# Patient Record
Sex: Female | Born: 1950
Health system: Southern US, Community
[De-identification: ages and names within clinical notes are randomized; demographics above are authoritative.]

## PROBLEM LIST (undated history)

## (undated) DIAGNOSIS — J45909 Unspecified asthma, uncomplicated: Secondary | ICD-10-CM

## (undated) DIAGNOSIS — D693 Immune thrombocytopenic purpura: Secondary | ICD-10-CM

## (undated) DIAGNOSIS — D689 Coagulation defect, unspecified: Secondary | ICD-10-CM

## (undated) DIAGNOSIS — T7840XA Allergy, unspecified, initial encounter: Secondary | ICD-10-CM

## (undated) DIAGNOSIS — M109 Gout, unspecified: Secondary | ICD-10-CM

## (undated) DIAGNOSIS — H409 Unspecified glaucoma: Secondary | ICD-10-CM

## (undated) DIAGNOSIS — H269 Unspecified cataract: Secondary | ICD-10-CM

## (undated) DIAGNOSIS — M1990 Unspecified osteoarthritis, unspecified site: Secondary | ICD-10-CM

## (undated) HISTORY — DX: Unspecified osteoarthritis, unspecified site: M19.90

## (undated) HISTORY — DX: Unspecified glaucoma: H40.9

## (undated) HISTORY — DX: Unspecified cataract: H26.9

## (undated) HISTORY — DX: Allergy, unspecified, initial encounter: T78.40XA

## (undated) HISTORY — DX: Coagulation defect, unspecified: D68.9

## (undated) HISTORY — PX: EYE SURGERY: SHX253

## (undated) HISTORY — PX: OTHER SURGICAL HISTORY: SHX169

---

## 1995-01-22 HISTORY — PX: OTHER SURGICAL HISTORY: SHX169

## 1997-09-19 ENCOUNTER — Other Ambulatory Visit: Admission: RE | Admit: 1997-09-19 | Discharge: 1997-09-19 | Payer: Self-pay | Admitting: Gynecology

## 1998-10-10 ENCOUNTER — Other Ambulatory Visit: Admission: RE | Admit: 1998-10-10 | Discharge: 1998-10-10 | Payer: Self-pay | Admitting: Gynecology

## 1998-10-12 ENCOUNTER — Other Ambulatory Visit: Admission: RE | Admit: 1998-10-12 | Discharge: 1998-10-12 | Payer: Self-pay | Admitting: Gynecology

## 1999-01-22 HISTORY — PX: ABDOMINAL HYSTERECTOMY: SHX81

## 1999-10-11 ENCOUNTER — Other Ambulatory Visit: Admission: RE | Admit: 1999-10-11 | Discharge: 1999-10-11 | Payer: Self-pay | Admitting: Gynecology

## 1999-12-31 ENCOUNTER — Encounter: Payer: Self-pay | Admitting: Gynecology

## 2000-01-03 ENCOUNTER — Inpatient Hospital Stay (HOSPITAL_COMMUNITY): Admission: RE | Admit: 2000-01-03 | Discharge: 2000-01-04 | Payer: Self-pay | Admitting: Gynecology

## 2000-10-20 ENCOUNTER — Other Ambulatory Visit: Admission: RE | Admit: 2000-10-20 | Discharge: 2000-10-20 | Payer: Self-pay | Admitting: Gynecology

## 2001-02-02 ENCOUNTER — Ambulatory Visit (HOSPITAL_COMMUNITY): Admission: RE | Admit: 2001-02-02 | Discharge: 2001-02-02 | Payer: Self-pay | Admitting: Gastroenterology

## 2001-10-27 ENCOUNTER — Other Ambulatory Visit: Admission: RE | Admit: 2001-10-27 | Discharge: 2001-10-27 | Payer: Self-pay | Admitting: Gynecology

## 2002-11-30 ENCOUNTER — Other Ambulatory Visit: Admission: RE | Admit: 2002-11-30 | Discharge: 2002-11-30 | Payer: Self-pay | Admitting: Gynecology

## 2003-12-13 ENCOUNTER — Other Ambulatory Visit: Admission: RE | Admit: 2003-12-13 | Discharge: 2003-12-13 | Payer: Self-pay | Admitting: Gynecology

## 2005-01-28 ENCOUNTER — Other Ambulatory Visit: Admission: RE | Admit: 2005-01-28 | Discharge: 2005-01-28 | Payer: Self-pay | Admitting: Gynecology

## 2005-10-04 ENCOUNTER — Encounter: Admission: RE | Admit: 2005-10-04 | Discharge: 2005-10-04 | Payer: Self-pay | Admitting: Family Medicine

## 2005-10-04 IMAGING — US US ABDOMEN COMPLETE
1 series · 13 of 25 positions shown · non-contrast
Comparison: None

CLINICAL DATA: Elevated liver function tests. Prior splenectomy due to ITP.

ABDOMEN ULTRASOUND
TECHNIQUE: Complete abdominal ultrasound examination was performed including
evaluation of the liver, gallbladder, bile ducts, pancreas, kidneys, spleen,
IVC, and abdominal aorta.

[Series 1: unknown · 13 of 60 slices shown]
[im 1/60]
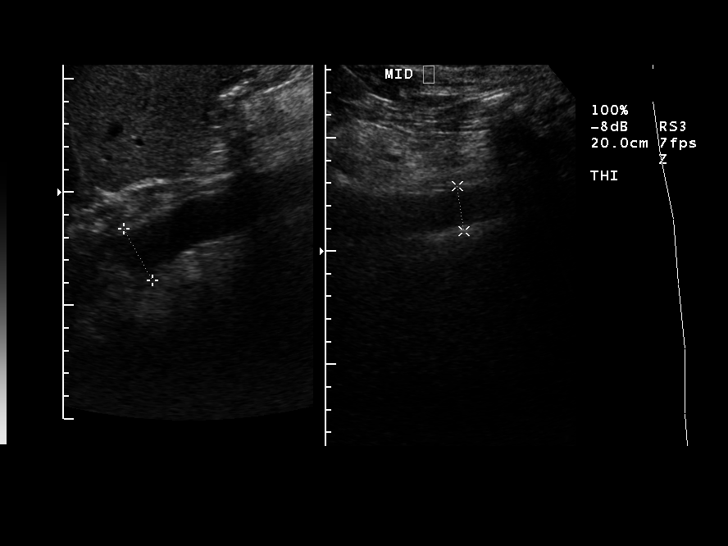
[im 5/60]
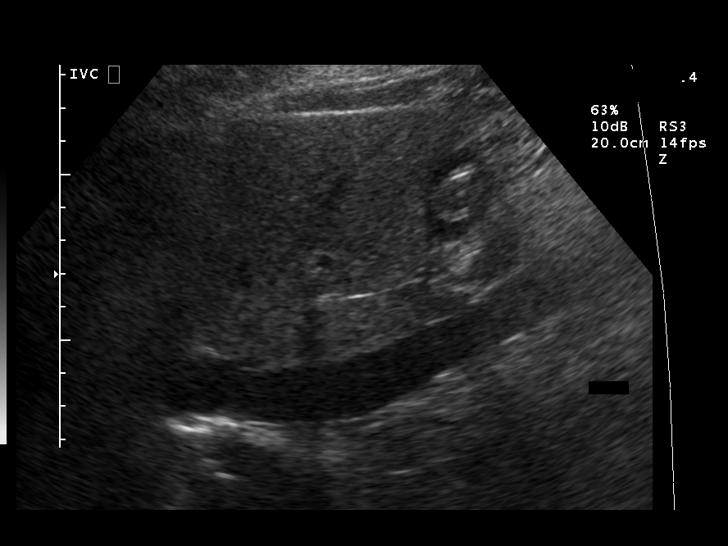
[im 10/60]
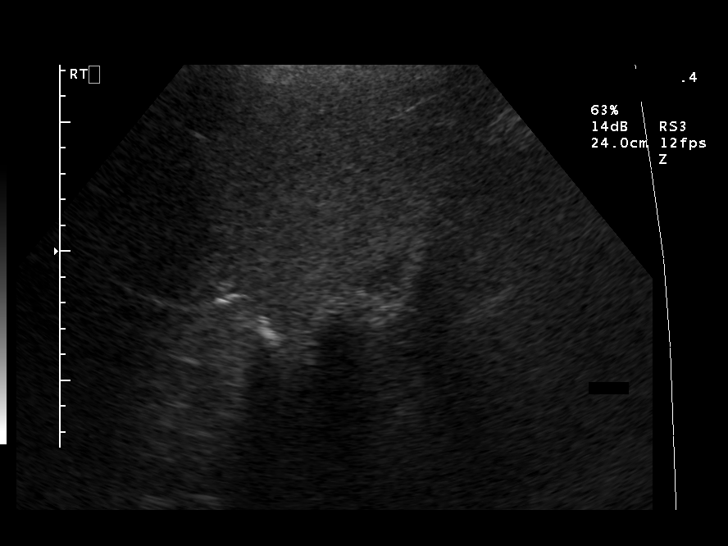
[im 15/60]
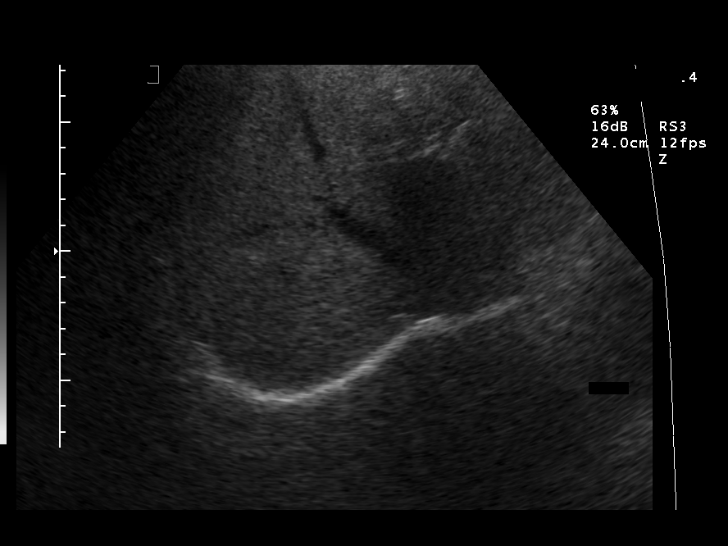
[im 20/60]
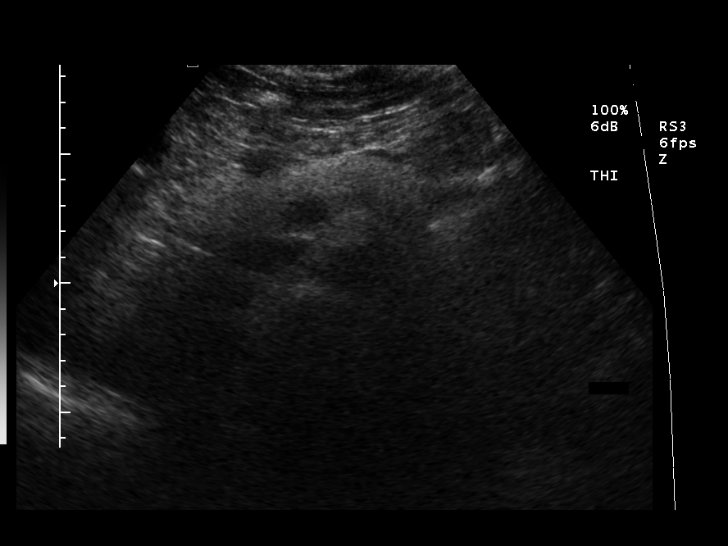
[im 25/60]
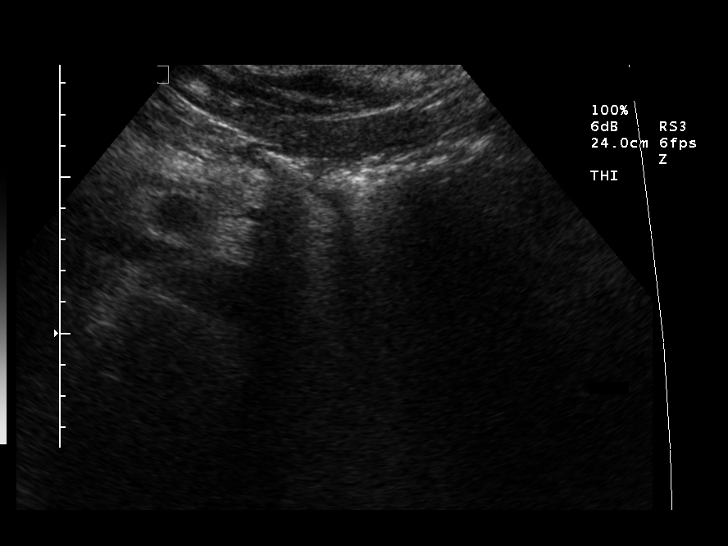
[im 30/60]
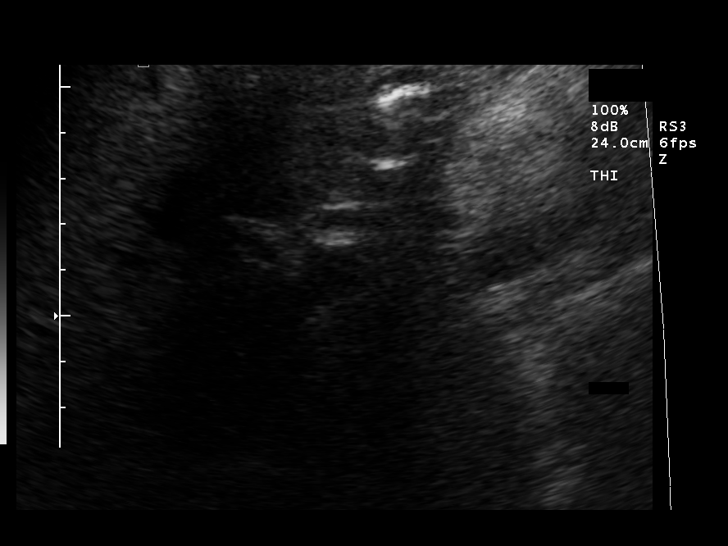
[im 35/60]
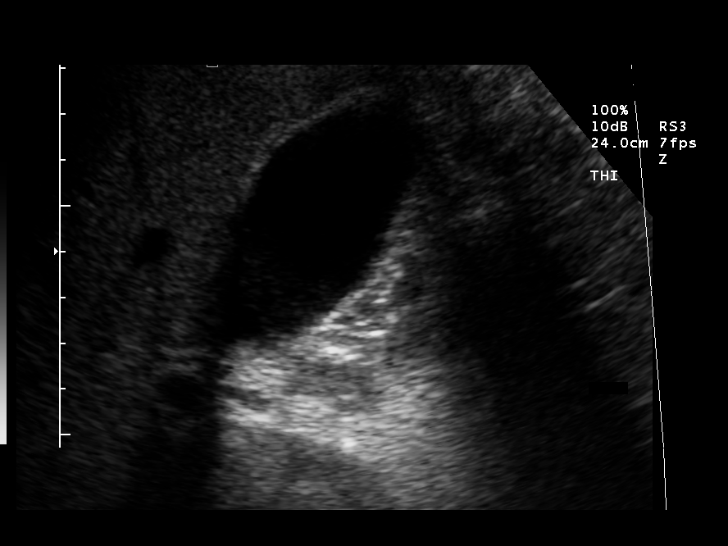
[im 40/60]
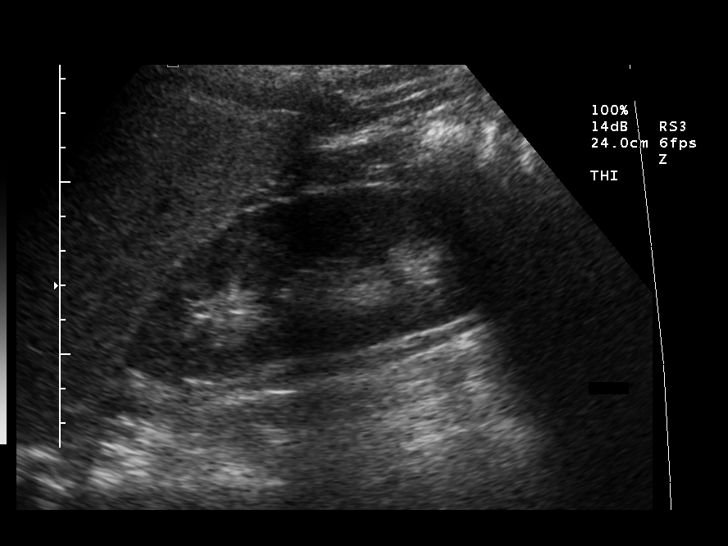
[im 45/60]
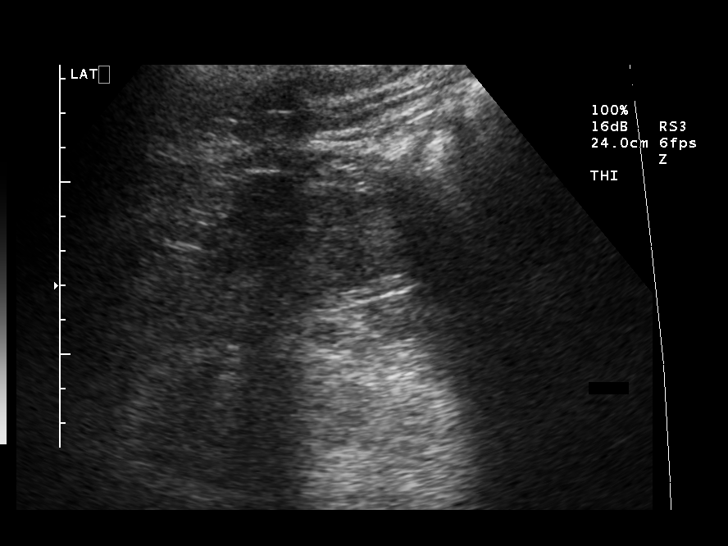
[im 50/60]
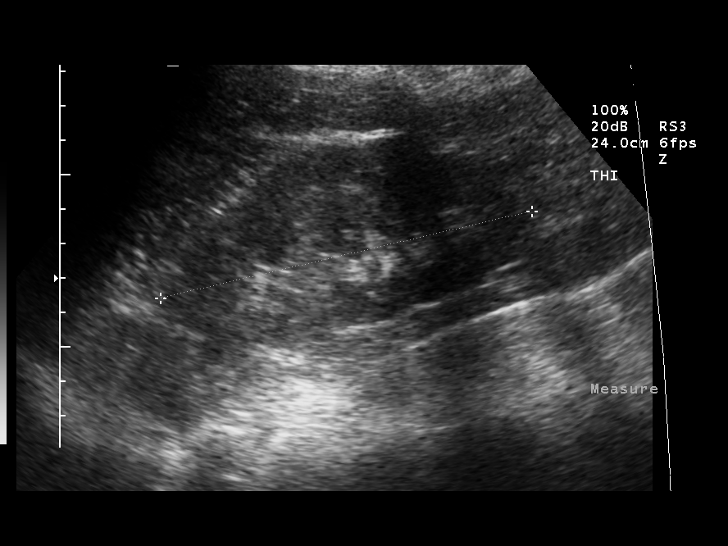
[im 55/60]
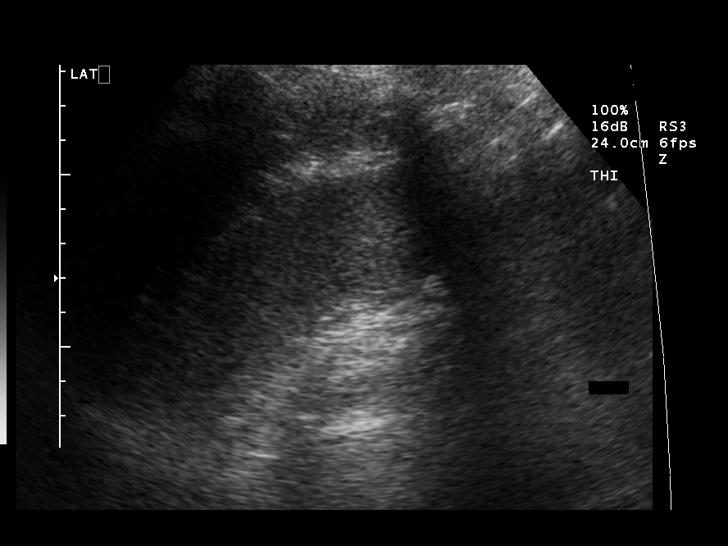
[im 60/60]
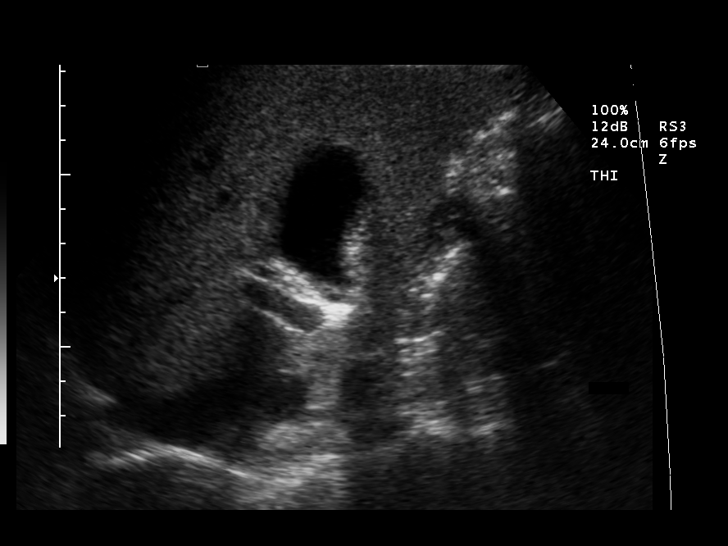

[13 of 25 positions shown; findings below may reference images not displayed]

FINDINGS: The gallbladder appears unremarkable, without evidence of gallbladder wall
thickening, pericholecystic fluid, gallstones, or sonographic Murphy sign.

The common bile duct measures 5 mm which is within normal limits.

The liver is mildly hyperechoic as can be encountered in fatty infiltration. No
focal liver lesion is identified. No hepatic enlargement is detected.

The pancreatic body appears unremarkable. Evaluation of the pancreatic tail,
pancreatic head, and IVC is limited due to overlying bowel gas. The spleen is
absent.

The right kidney measures 12.1 cm in greatest length in the left kidney measures
11.2 cm in greatest length. No renal stones, masses, or hydronephrosis.

The abdominal aorta demonstrates normal tapering course.

IMPRESSION

1. Mild hyperechogenicity of the liver raises the possibility of fatty
infiltration. No focal hepatic abnormality is identified.
2. Limited evaluation of the IVC and pancreatic head and tail due to overlying
bowel gas.
3. Prior splenectomy.
4. Otherwise unremarkable.

## 2012-10-19 ENCOUNTER — Emergency Department (HOSPITAL_COMMUNITY): Payer: BC Managed Care – PPO

## 2012-10-19 ENCOUNTER — Ambulatory Visit: Payer: BC Managed Care – PPO

## 2012-10-19 ENCOUNTER — Emergency Department (HOSPITAL_COMMUNITY)
Admission: EM | Admit: 2012-10-19 | Discharge: 2012-10-20 | Disposition: A | Discharge status: Home / Self Care | Payer: BC Managed Care – PPO | Attending: Emergency Medicine | Admitting: Emergency Medicine

## 2012-10-19 ENCOUNTER — Encounter (HOSPITAL_COMMUNITY): Payer: Self-pay | Admitting: Emergency Medicine

## 2012-10-19 ENCOUNTER — Ambulatory Visit (INDEPENDENT_AMBULATORY_CARE_PROVIDER_SITE_OTHER): Payer: BC Managed Care – PPO | Admitting: Family Medicine

## 2012-10-19 VITALS — BP 110/72 | HR 113 | Temp 98.1°F | Resp 18 | Ht 65.5 in | Wt 179.0 lb

## 2012-10-19 DIAGNOSIS — R0781 Pleurodynia: Secondary | ICD-10-CM

## 2012-10-19 DIAGNOSIS — R059 Cough, unspecified: Secondary | ICD-10-CM

## 2012-10-19 DIAGNOSIS — R109 Unspecified abdominal pain: Secondary | ICD-10-CM

## 2012-10-19 DIAGNOSIS — R1011 Right upper quadrant pain: Secondary | ICD-10-CM | POA: Insufficient documentation

## 2012-10-19 DIAGNOSIS — R0902 Hypoxemia: Secondary | ICD-10-CM | POA: Insufficient documentation

## 2012-10-19 DIAGNOSIS — R05 Cough: Secondary | ICD-10-CM

## 2012-10-19 DIAGNOSIS — R110 Nausea: Secondary | ICD-10-CM | POA: Insufficient documentation

## 2012-10-19 DIAGNOSIS — J209 Acute bronchitis, unspecified: Secondary | ICD-10-CM | POA: Insufficient documentation

## 2012-10-19 DIAGNOSIS — J40 Bronchitis, not specified as acute or chronic: Secondary | ICD-10-CM

## 2012-10-19 DIAGNOSIS — F17200 Nicotine dependence, unspecified, uncomplicated: Secondary | ICD-10-CM | POA: Insufficient documentation

## 2012-10-19 DIAGNOSIS — Z862 Personal history of diseases of the blood and blood-forming organs and certain disorders involving the immune mechanism: Secondary | ICD-10-CM | POA: Insufficient documentation

## 2012-10-19 DIAGNOSIS — R079 Chest pain, unspecified: Secondary | ICD-10-CM

## 2012-10-19 DIAGNOSIS — R1013 Epigastric pain: Secondary | ICD-10-CM | POA: Insufficient documentation

## 2012-10-19 DIAGNOSIS — Z8669 Personal history of other diseases of the nervous system and sense organs: Secondary | ICD-10-CM | POA: Insufficient documentation

## 2012-10-19 HISTORY — DX: Immune thrombocytopenic purpura: D69.3

## 2012-10-19 LAB — POCT CBC
Granulocyte percent: 58.2 %G (ref 37–80)
HCT, POC: 47.7 % (ref 37.7–47.9)
Hemoglobin: 15.4 g/dL (ref 12.2–16.2)
Lymph, poc: 5.7 — AB (ref 0.6–3.4)
MCH, POC: 31.6 pg — AB (ref 27–31.2)
MCHC: 32.3 g/dL (ref 31.8–35.4)
MCV: 98 fL — AB (ref 80–97)
MID (cbc): 1.1 — AB (ref 0–0.9)
MPV: 9.3 fL (ref 0–99.8)
POC Granulocyte: 9.4 — AB (ref 2–6.9)
POC LYMPH PERCENT: 35.2 %L (ref 10–50)
POC MID %: 6.6 %M (ref 0–12)
Platelet Count, POC: 576 10*3/uL — AB (ref 142–424)
RBC: 4.87 M/uL (ref 4.04–5.48)
RDW, POC: 13.6 %
WBC: 16.2 10*3/uL — AB (ref 4.6–10.2)

## 2012-10-19 LAB — URINE MICROSCOPIC-ADD ON

## 2012-10-19 LAB — COMPREHENSIVE METABOLIC PANEL
ALT: 30 U/L (ref 0–35)
AST: 20 U/L (ref 0–37)
Albumin: 3.7 g/dL (ref 3.5–5.2)
BUN: 19 mg/dL (ref 6–23)
CO2: 25 mEq/L (ref 19–32)
Calcium: 9.6 mg/dL (ref 8.4–10.5)
Creatinine, Ser: 0.8 mg/dL (ref 0.50–1.10)
GFR calc non Af Amer: 77 mL/min — ABNORMAL LOW (ref >90)
Total Protein: 7.4 g/dL (ref 6.0–8.3)

## 2012-10-19 LAB — URINALYSIS, ROUTINE W REFLEX MICROSCOPIC
Hgb urine dipstick: NEGATIVE
Protein, ur: NEGATIVE mg/dL
Urobilinogen, UA: 0.2 mg/dL (ref 0.0–1.0)
pH: 5 (ref 5.0–8.0)

## 2012-10-19 LAB — CBC
MCH: 31.2 pg (ref 26.0–34.0)
MCV: 92.8 fL (ref 78.0–100.0)
Platelets: 559 10*3/uL — ABNORMAL HIGH (ref 150–400)
RDW: 13.5 % (ref 11.5–15.5)

## 2012-10-19 LAB — LIPASE, BLOOD: Lipase: 29 U/L (ref 11–59)

## 2012-10-19 IMAGING — CT CT ABD-PELV W/ CM
1 of 3 series · 14 of 32 positions shown, 19 images · IV contrast (OMNIPAQUE 300)
Comparison: Noncontrast CT earlier today.

CLINICAL DATA: Severe right flank pain, right upper quadrant pain.

EXAM:
CT ABDOMEN AND PELVIS WITH CONTRAST
TECHNIQUE: Multidetector CT imaging of the abdomen and pelvis was performed
using the standard protocol following bolus administration of
intravenous contrast.
CONTRAST:  100mL OMNIPAQUE IOHEXOL 300 MG/ML  SOLN

[Series 2: abd/pel with · axial · 0.83mm/px · z∈[+1028,+1434]mm · 14 of 93 slices shown, 19 images]
[im 6/93  soft-tissue]
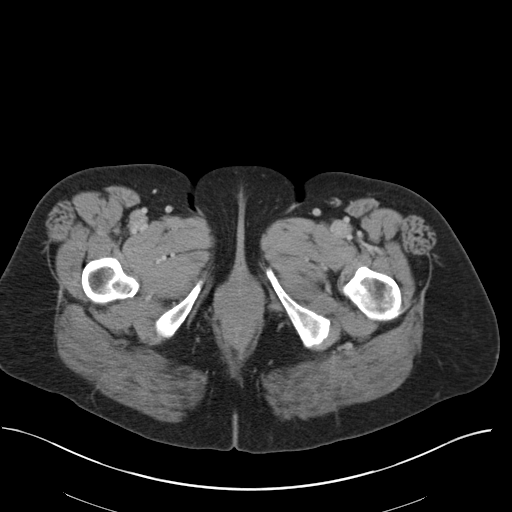
[im 6/93  bone]
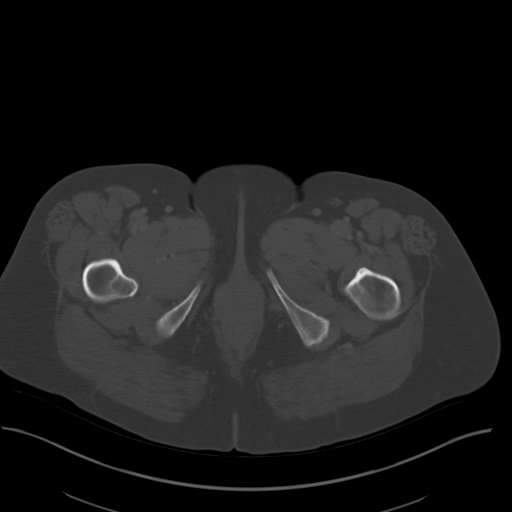
[im 11/93  soft-tissue]
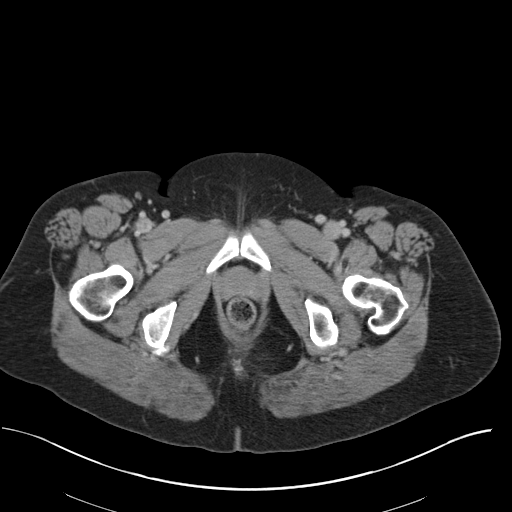
[im 21/93  soft-tissue]
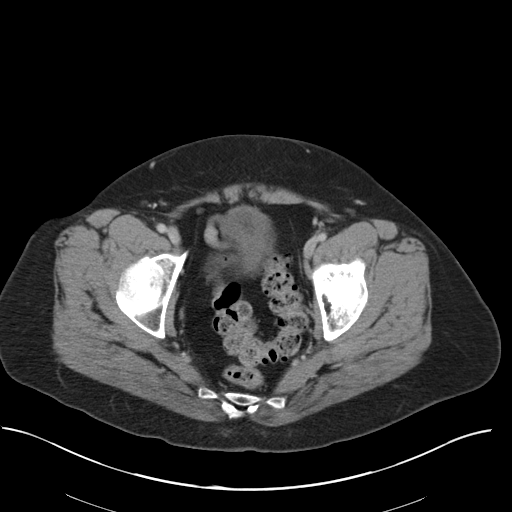
[im 26/93  soft-tissue]
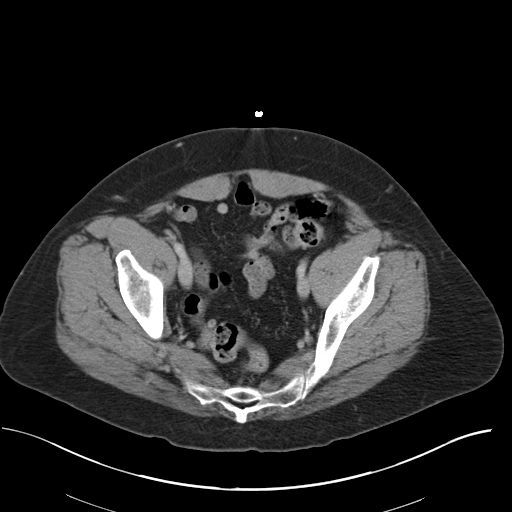
[im 31/93  soft-tissue]
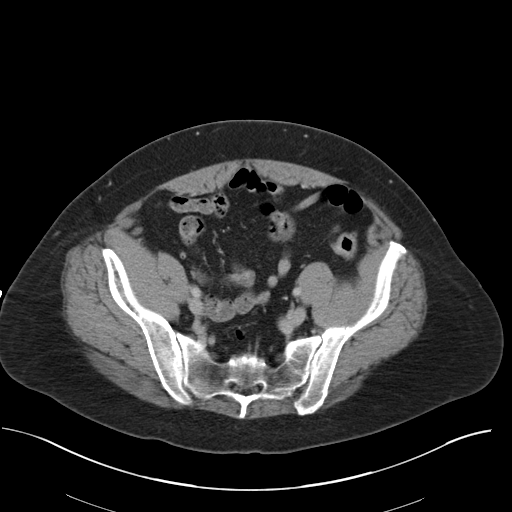
[im 41/93  soft-tissue]
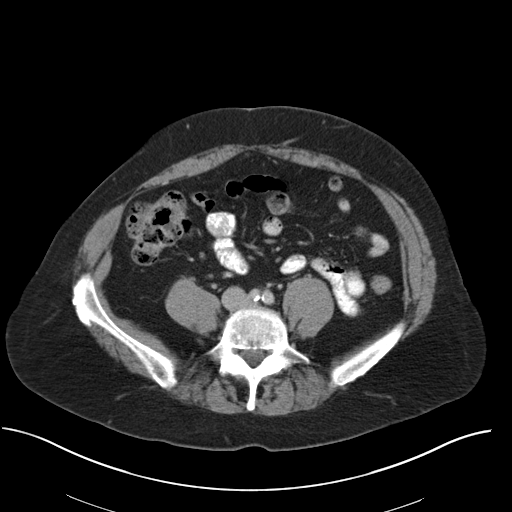
[im 47/93  soft-tissue]
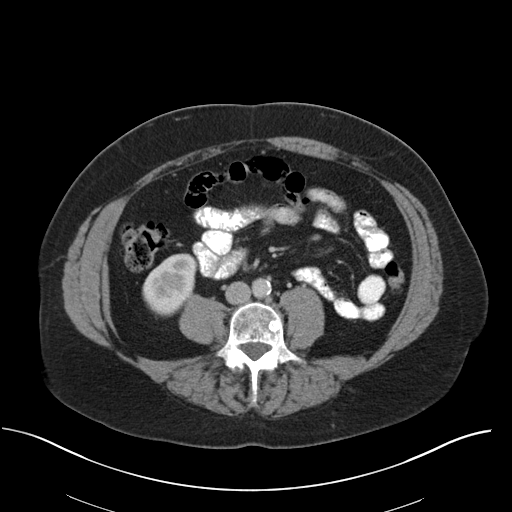
[im 52/93  soft-tissue]
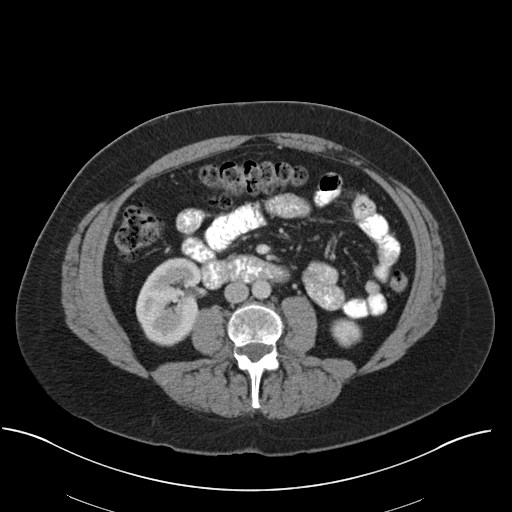
[im 62/93  soft-tissue]
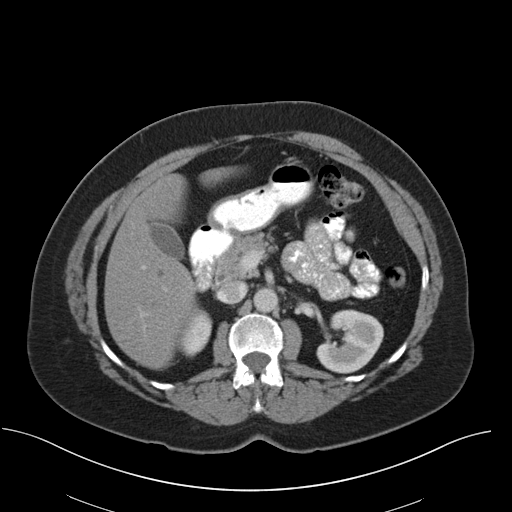
[im 62/93  bone]
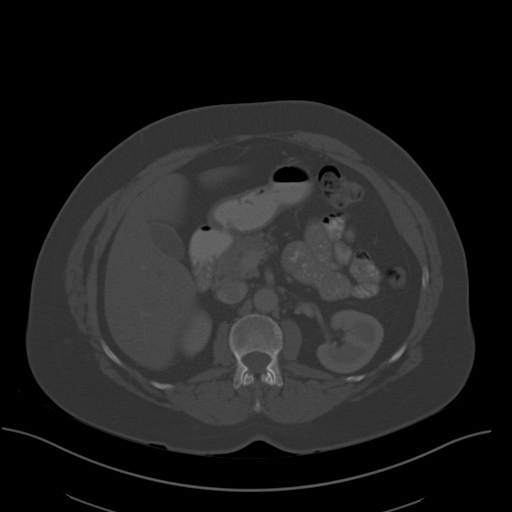
[im 67/93  soft-tissue]
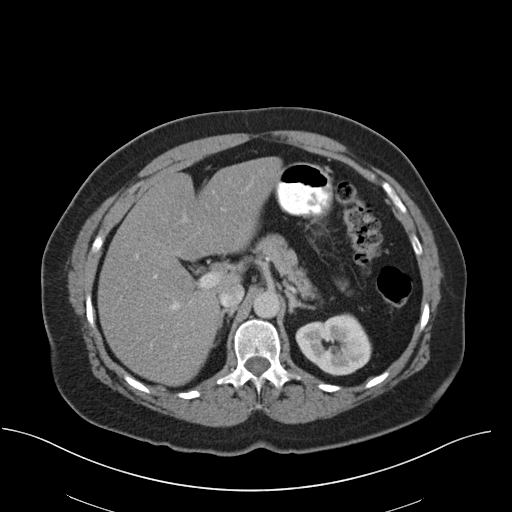
[im 72/93  soft-tissue]
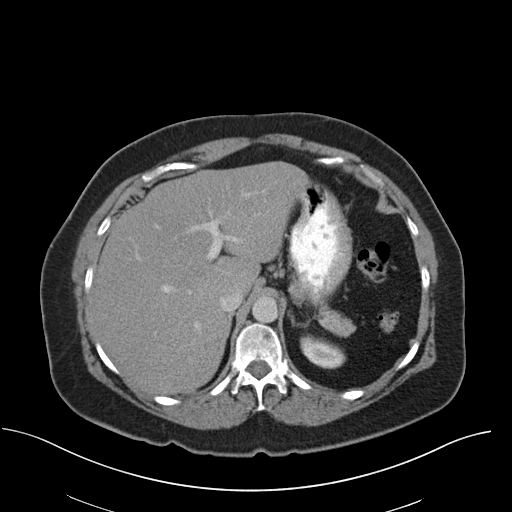
[im 72/93  lung]
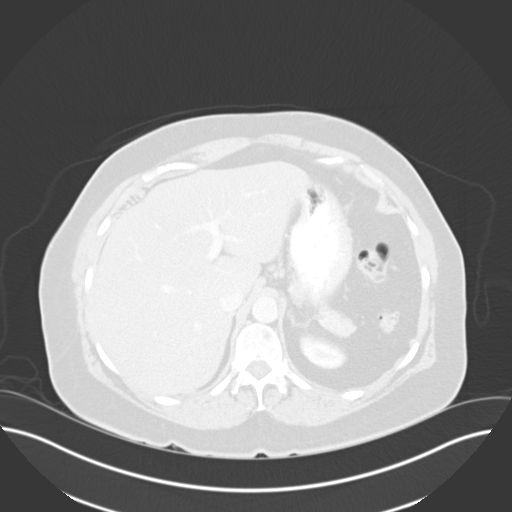
[im 77/93  lung]
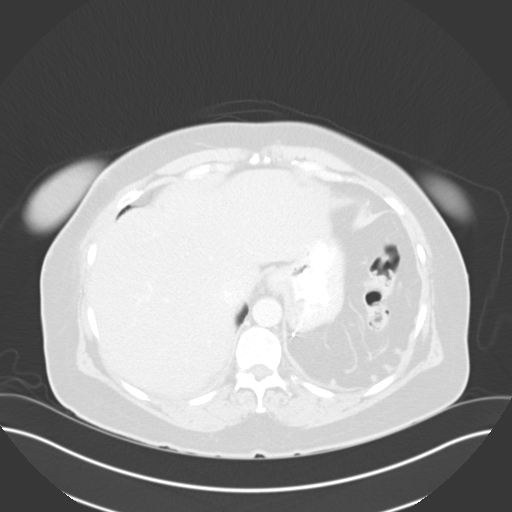
[im 82/93  soft-tissue]
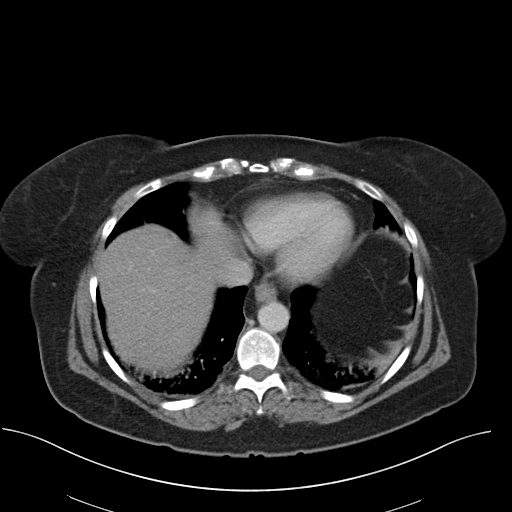
[im 82/93  lung]
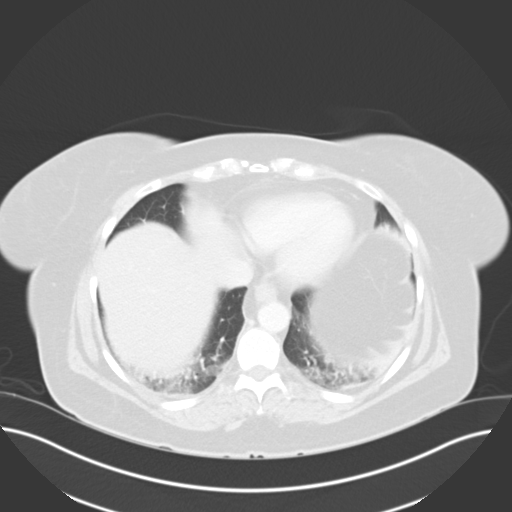
[im 87/93  soft-tissue]
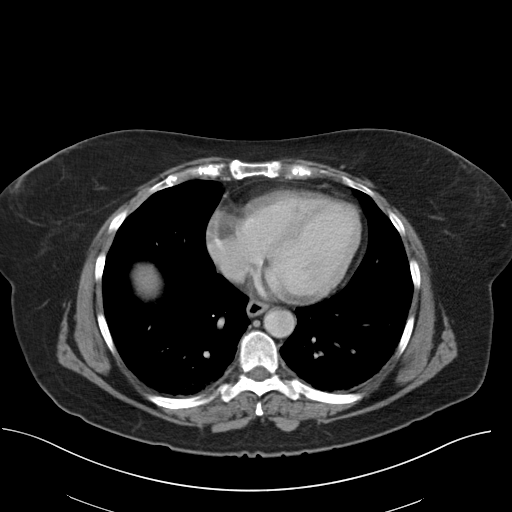
[im 87/93  lung]
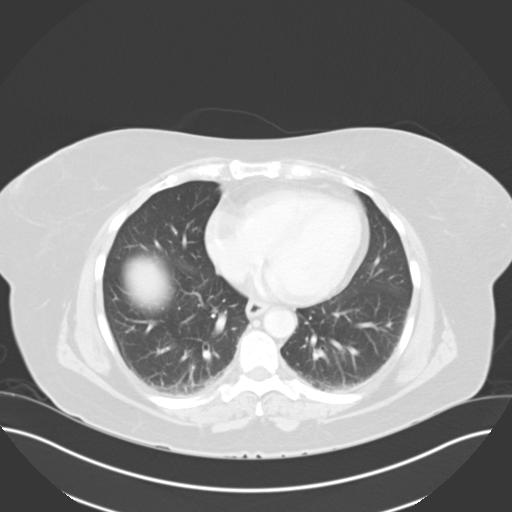

[14 of 32 positions shown; findings below may reference images not displayed]

FINDINGS: Dependent atelectasis in the lung bases. No effusions.

Suspect mild fatty infiltration of the liver. Tiny hypodensity
peripherally in the right inferior liver, likely small cyst. Prior
splenectomy. Gallbladder, pancreas, adrenals and kidneys are
unremarkable. Appendix is visualized and is normal.

Scattered sigmoid and descending colonic diverticula. No active
diverticulitis. Small bowel is decompressed. No free fluid, free air
or adenopathy. Prior hysterectomy. No adnexal masses. Urinary
bladder is unremarkable.

Aorta and iliac vessels are normal caliber. No acute bony
abnormality.
IMPRESSION: Diffuse fatty infiltration of the liver.

No acute findings in the abdomen or pelvis.

## 2012-10-19 IMAGING — CR DG CHEST 2V
2 series · 2 of 2 positions shown · non-contrast
Comparison: None.

CLINICAL DATA: Cough.

EXAM:
CHEST  2 VIEW

[w chest pa]
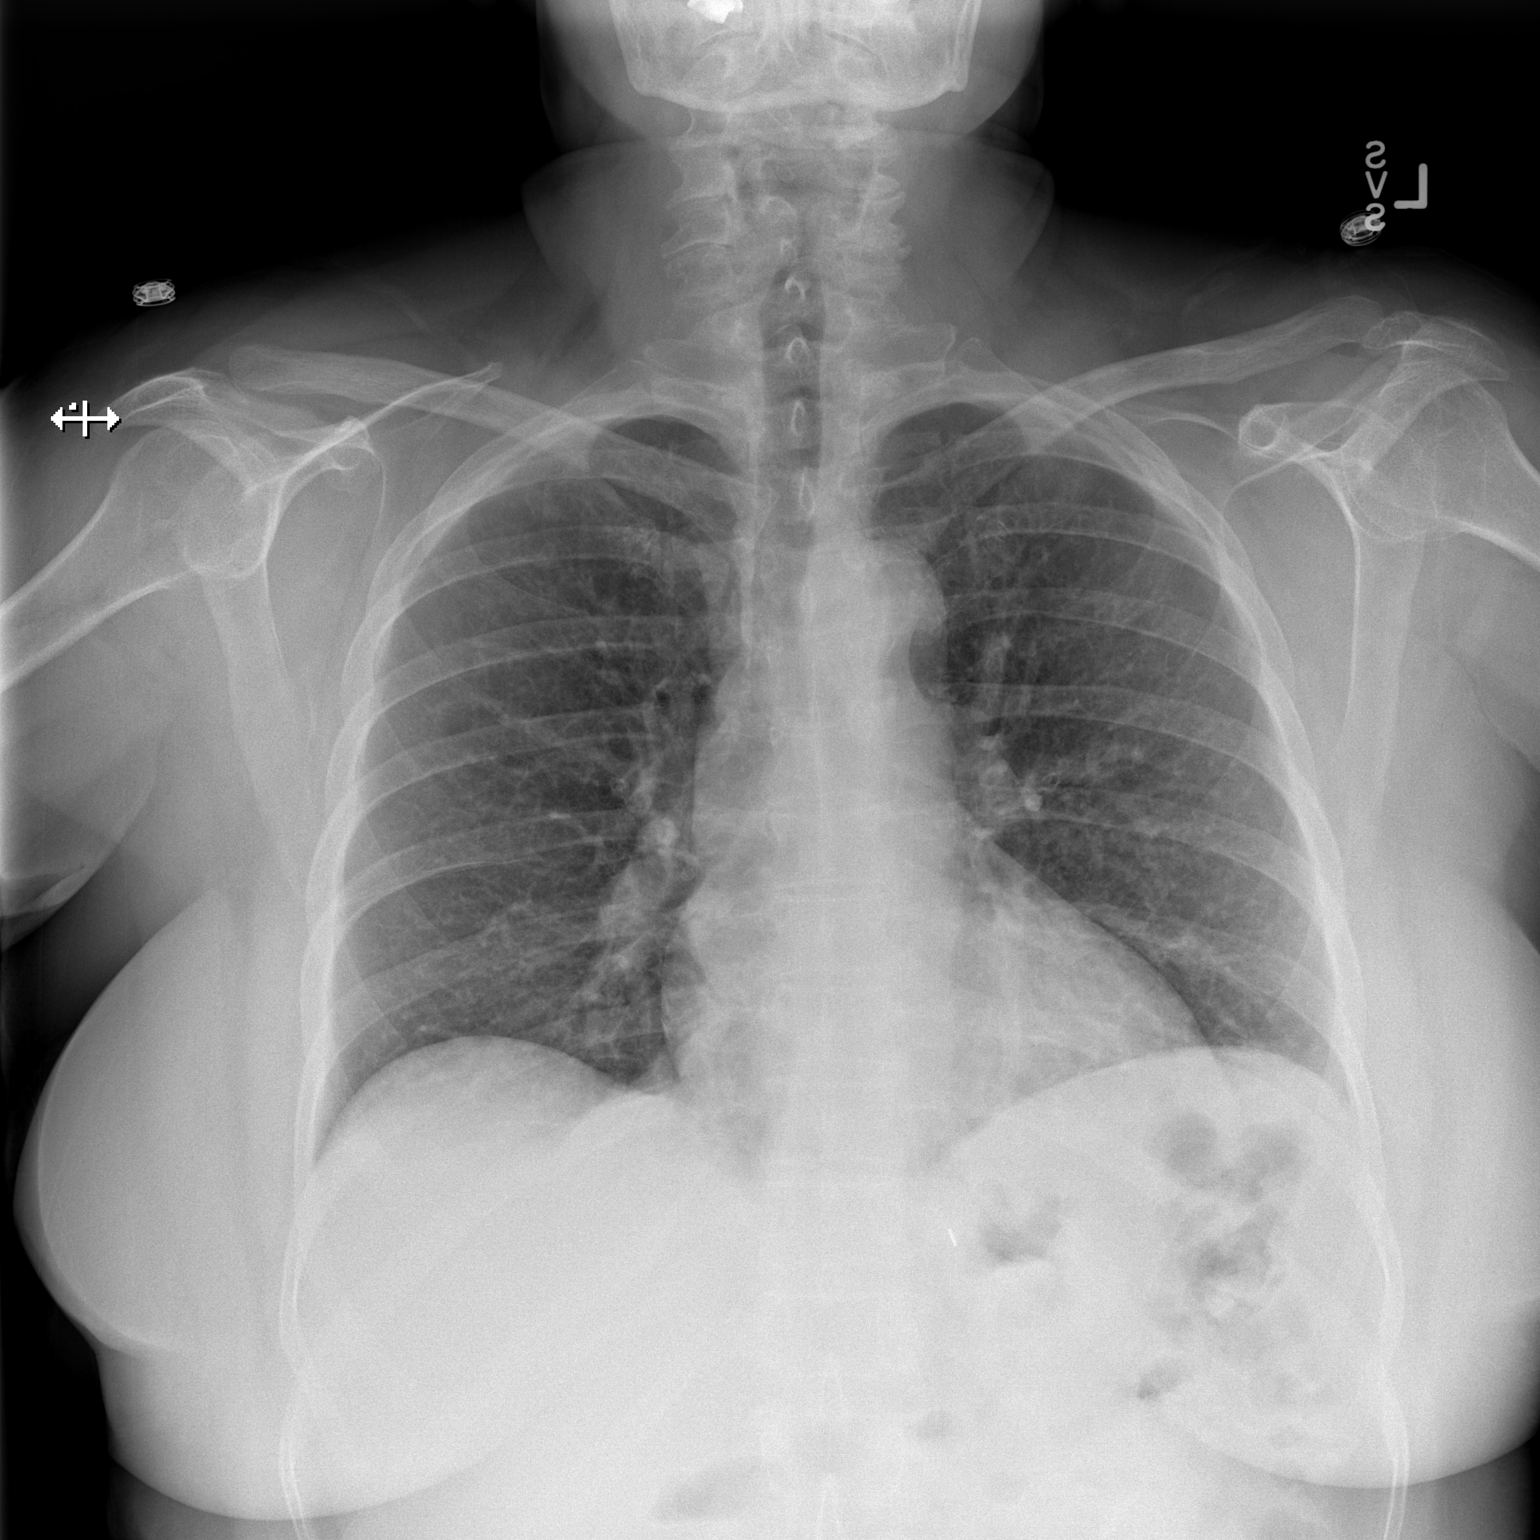

[w chest lat]
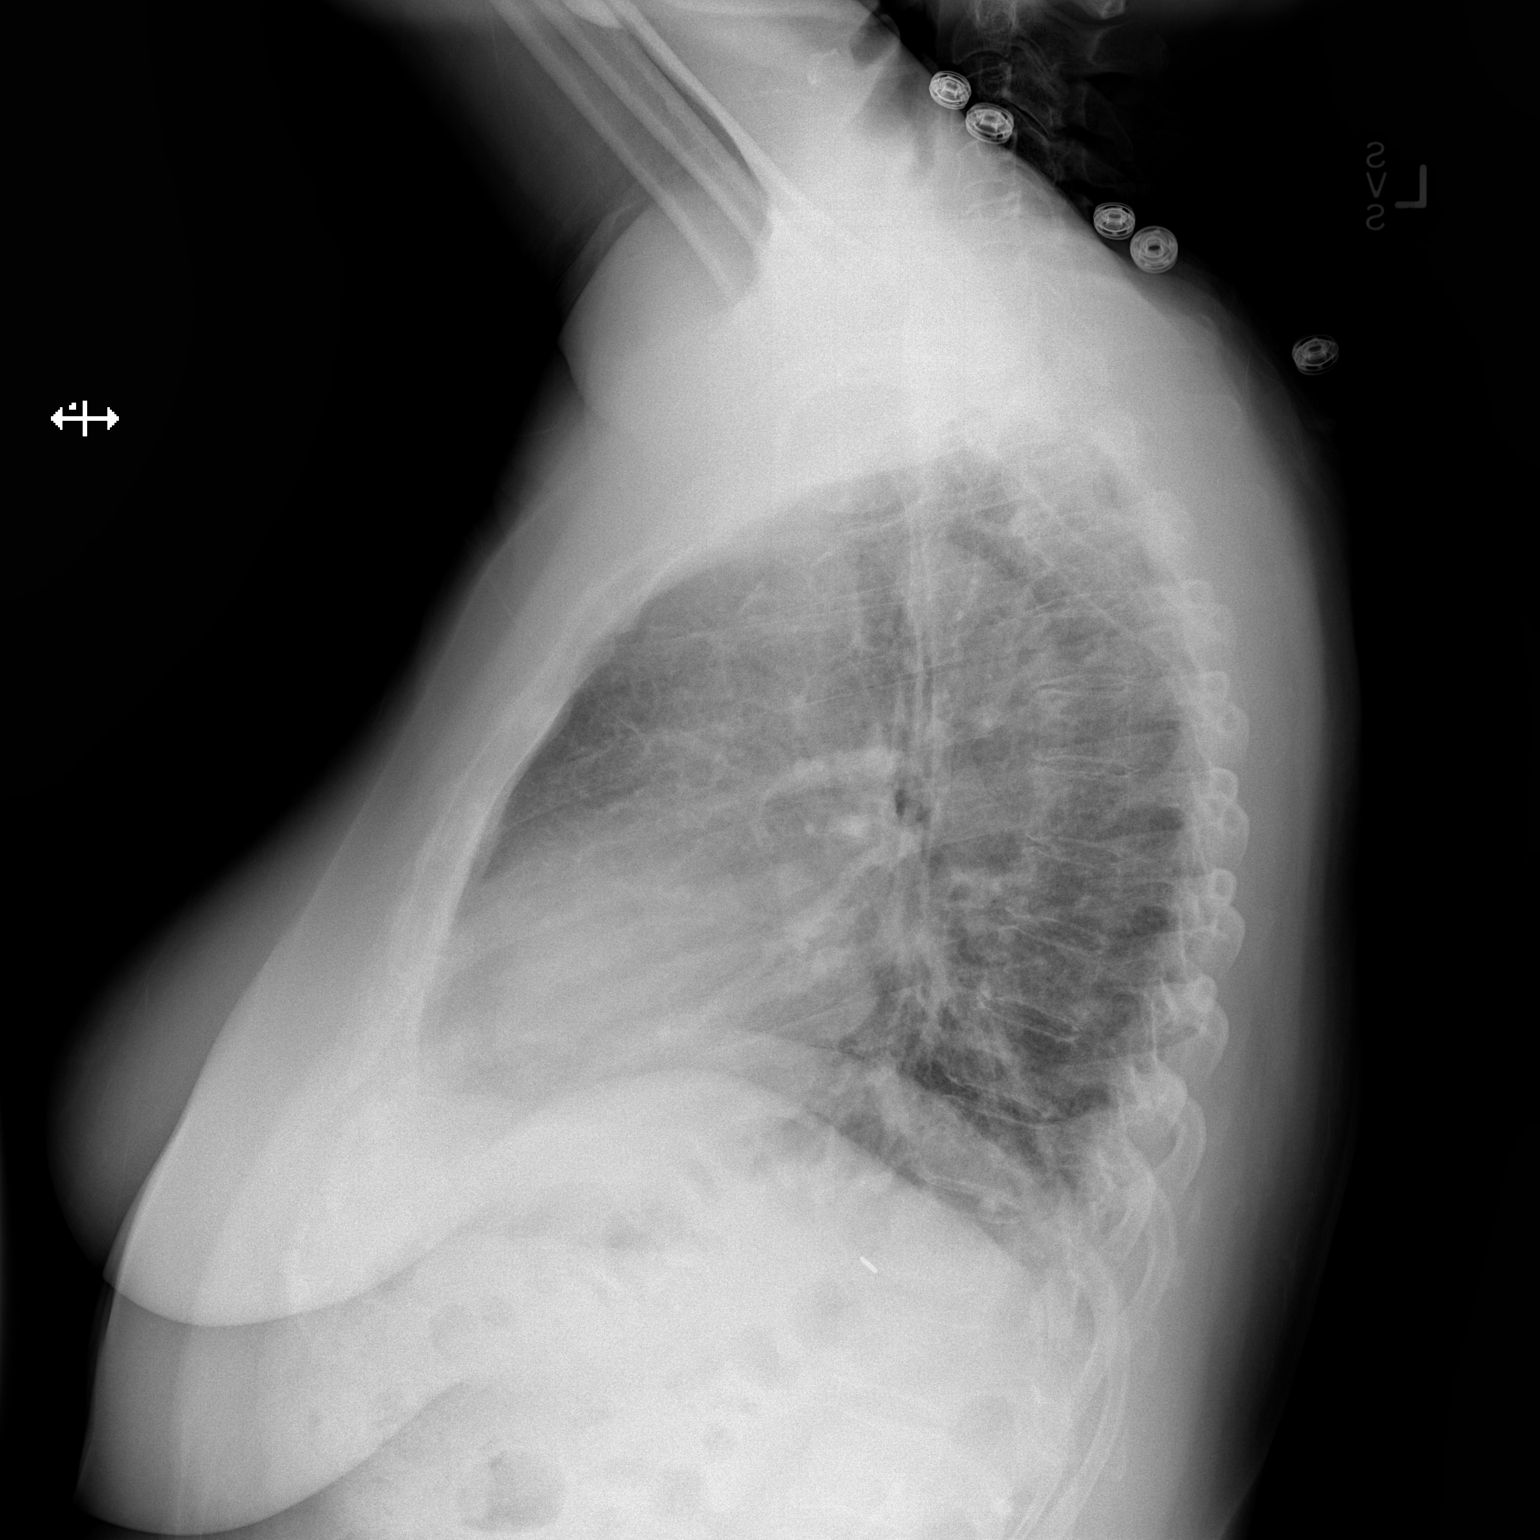

[2 of 2 positions shown; findings below may reference images not displayed]

FINDINGS: The cardiac silhouette, mediastinal and hilar contours are within
normal limits. Mild bronchitic type lung changes but no infiltrates,
edema or effusions. The bony thorax is intact.
IMPRESSION: Mild bronchitic type lung changes likely related to smoking. No
acute pulmonary findings.

## 2012-10-19 IMAGING — CR DG RIBS W/ CHEST 3+V*R*
4 series · 4 of 4 positions shown · non-contrast
Comparison: None.

CLINICAL DATA: Rib pain.

EXAM:
RIGHT RIBS AND CHEST - 3+ VIEW

[oblique (1 of 2)]
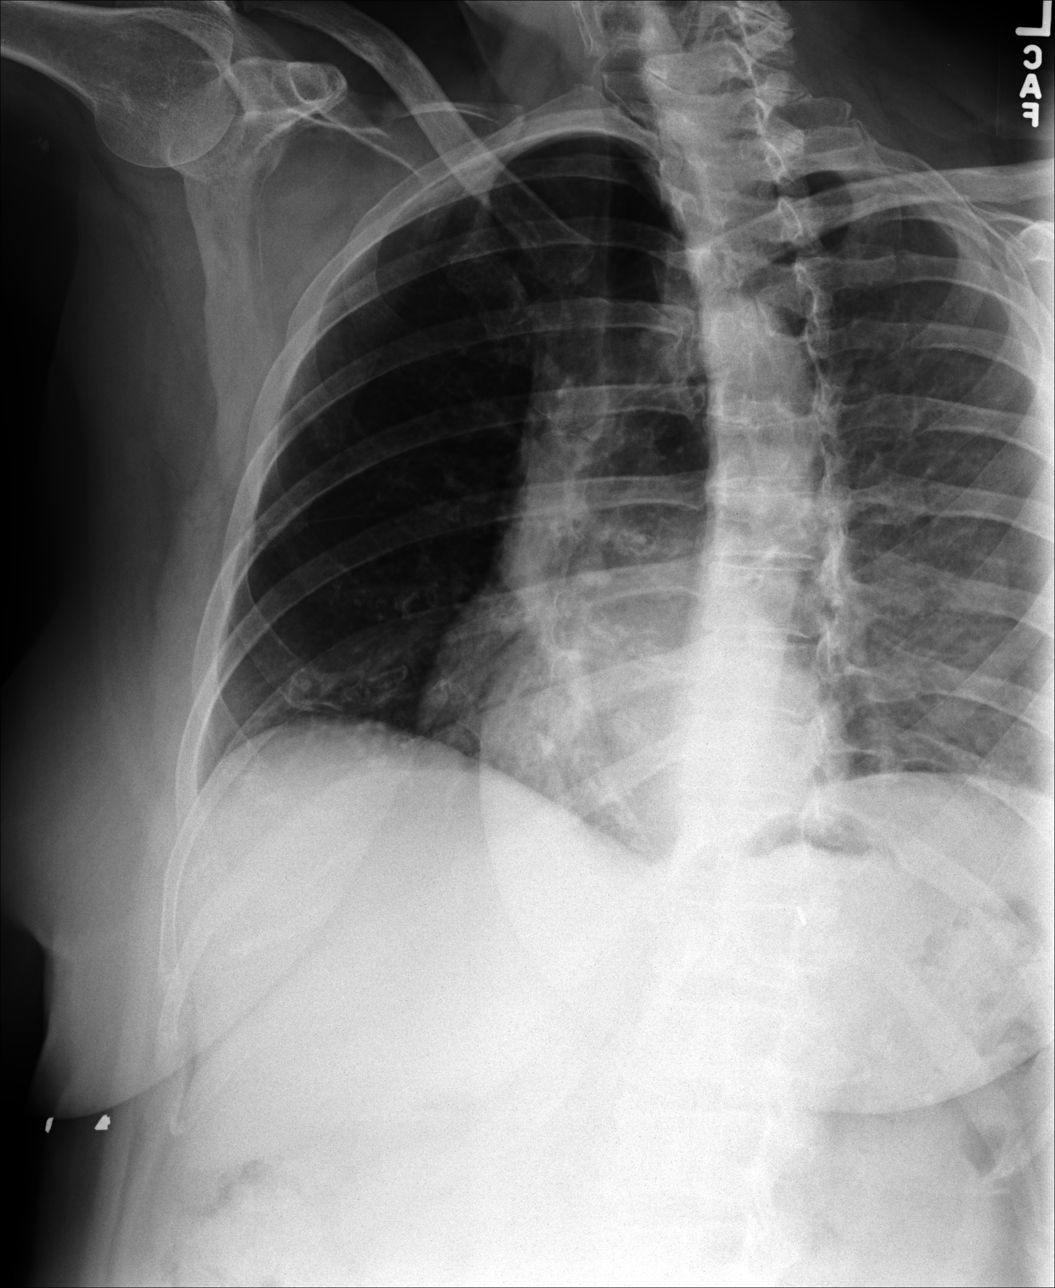

[PA]
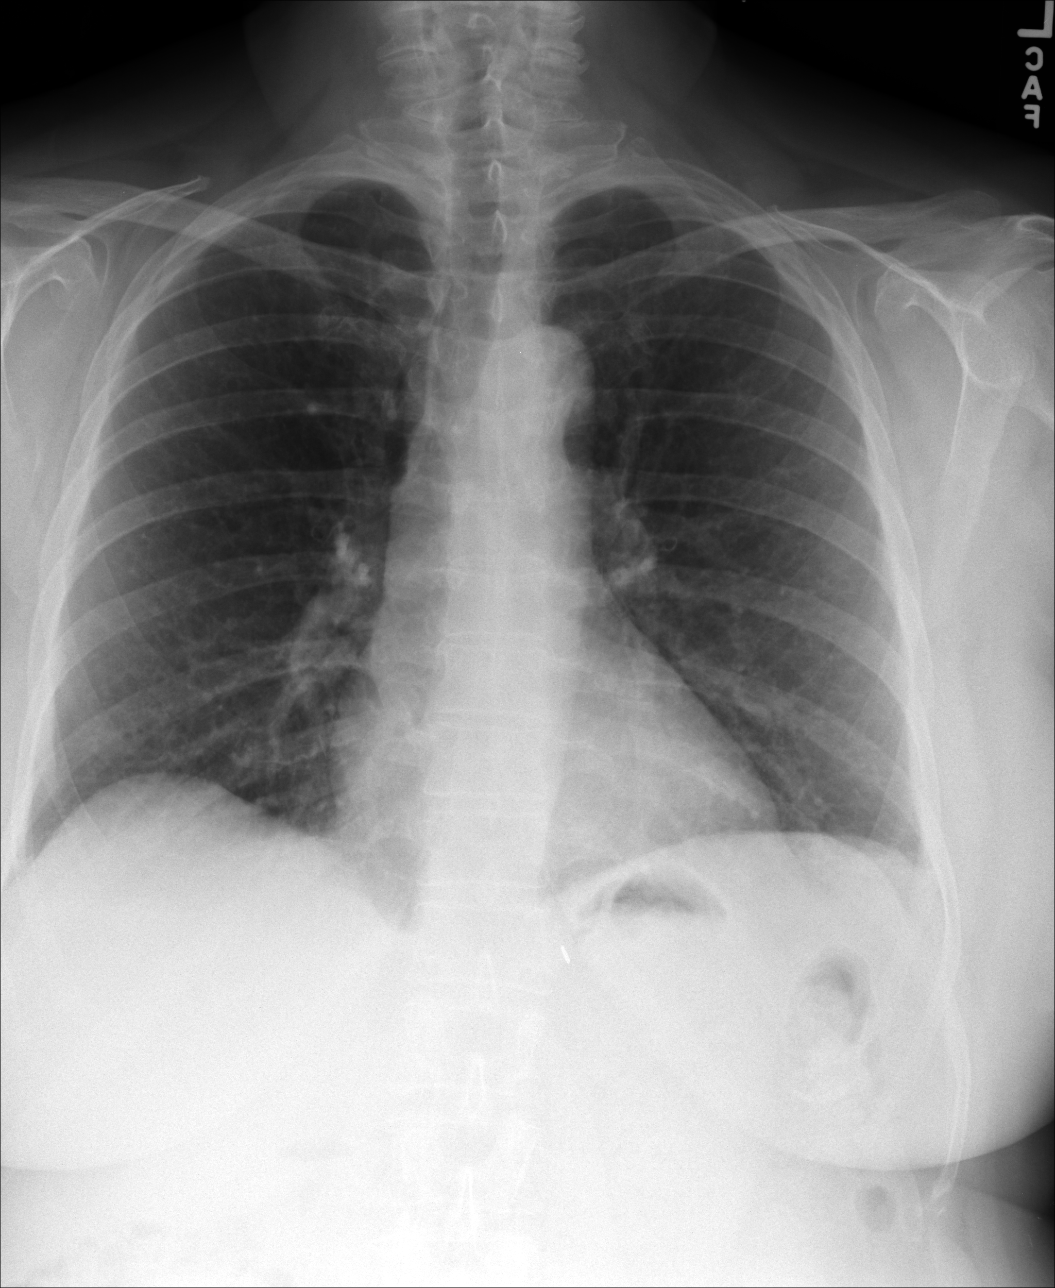

[lateral]
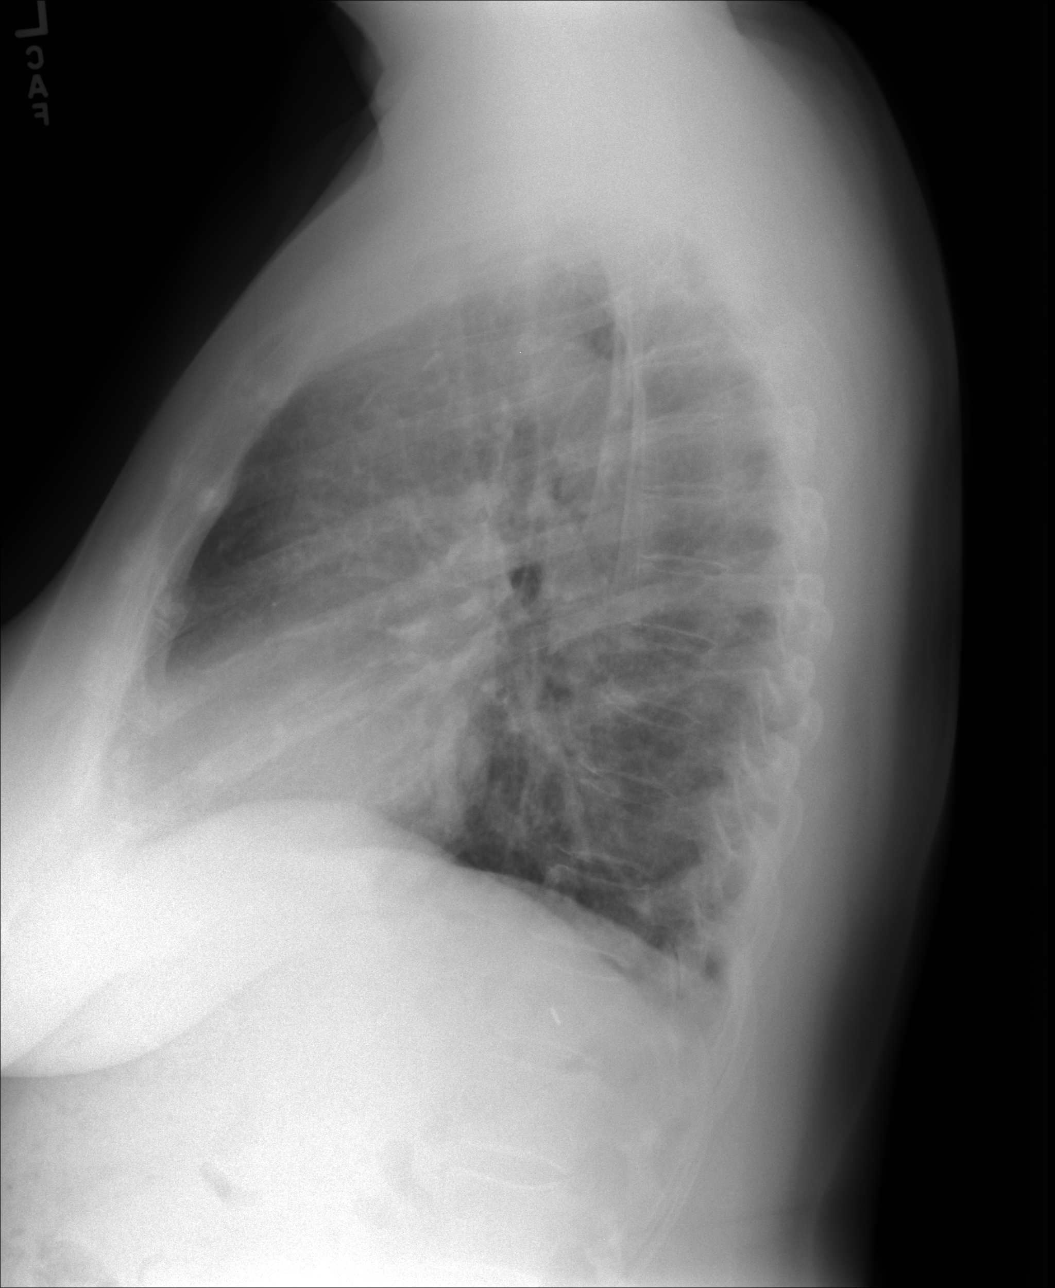

[oblique (2 of 2)]
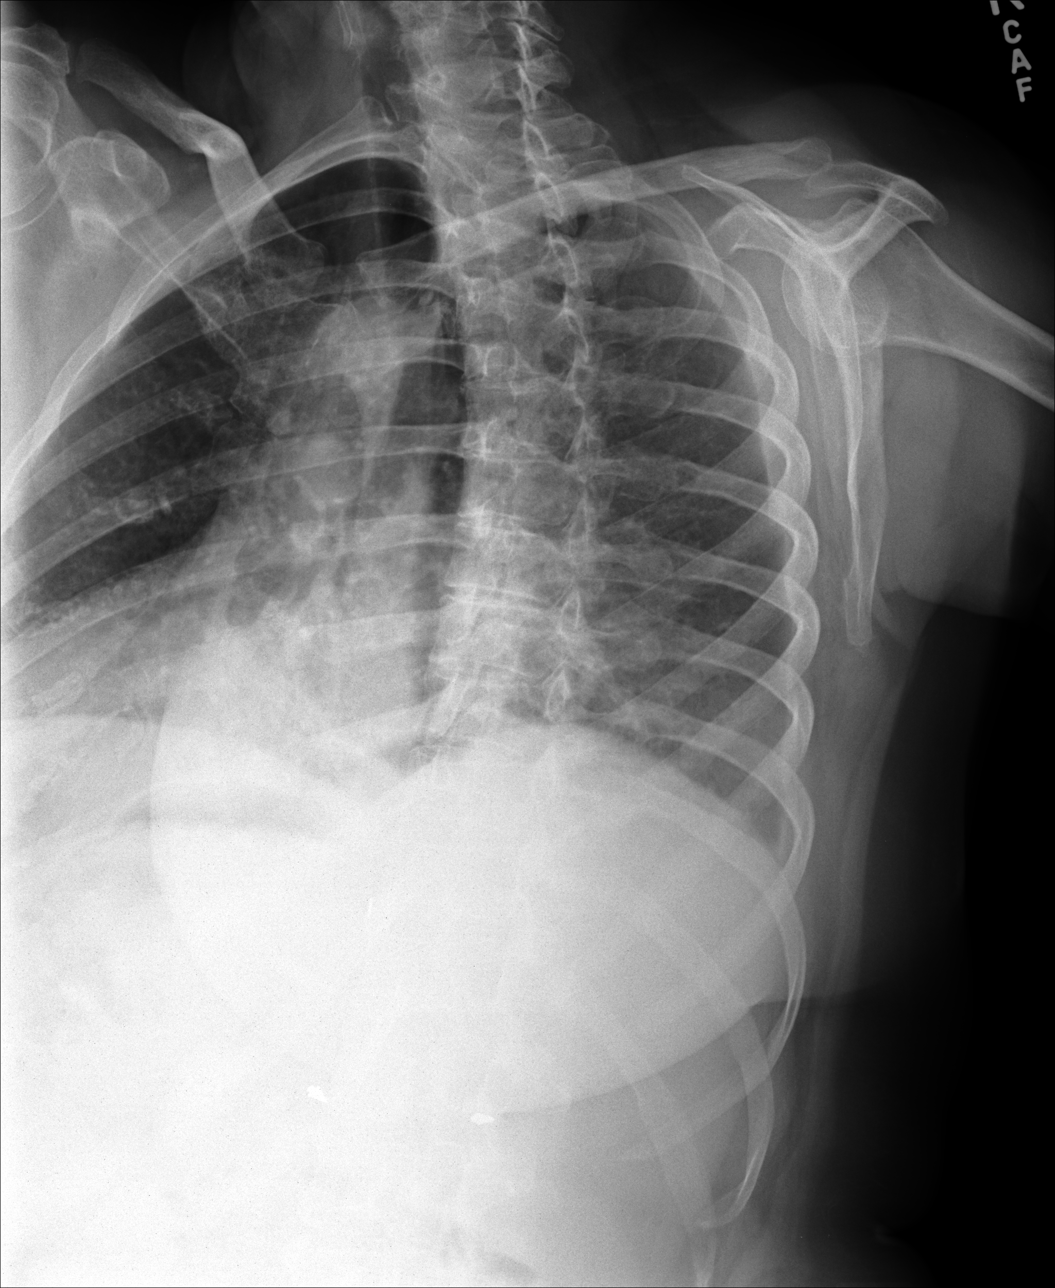

[4 of 4 positions shown; findings below may reference images not displayed]

FINDINGS: No fracture or other bone lesions are seen involving the ribs. There
is no evidence of pneumothorax or pleural effusion. Both lungs are
clear. Heart size and mediastinal contours are within normal limits.
IMPRESSION: Negative.

## 2012-10-19 IMAGING — US US ABDOMEN COMPLETE
1 series · 14 of 25 positions shown · non-contrast
Comparison: Ultrasound dated [DATE]

CLINICAL DATA: Right upper quadrant pain. Nausea.

EXAM:
ULTRASOUND ABDOMEN COMPLETE

[Series 1: us abdomen complete · 0.22mm/px · 14 of 72 slices shown]
[im 1/72]
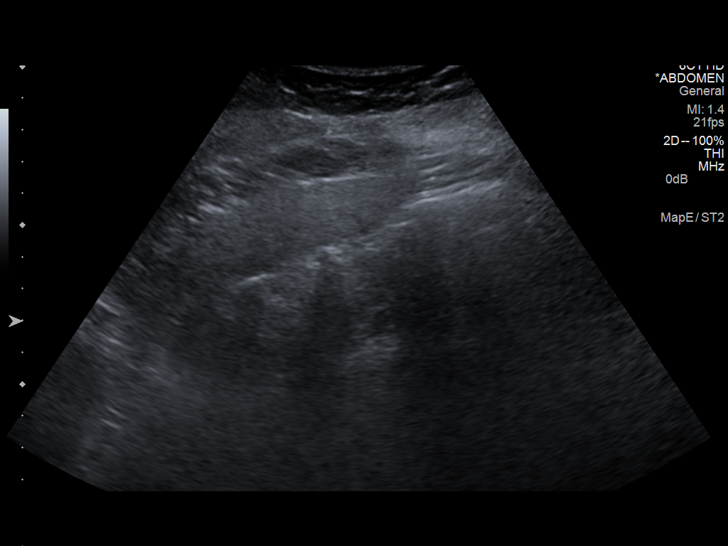
[im 6/72]
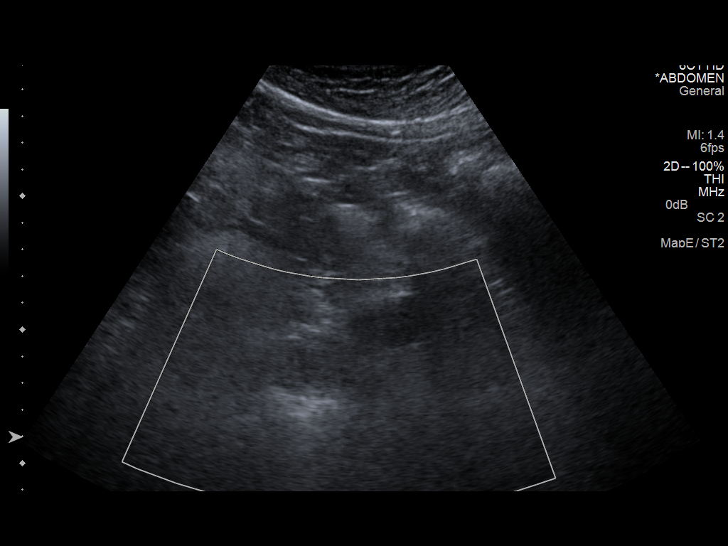
[im 12/72]
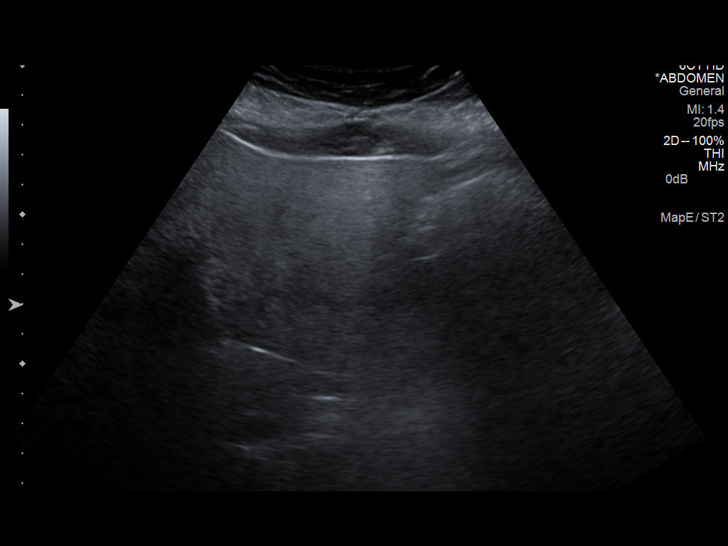
[im 18/72]
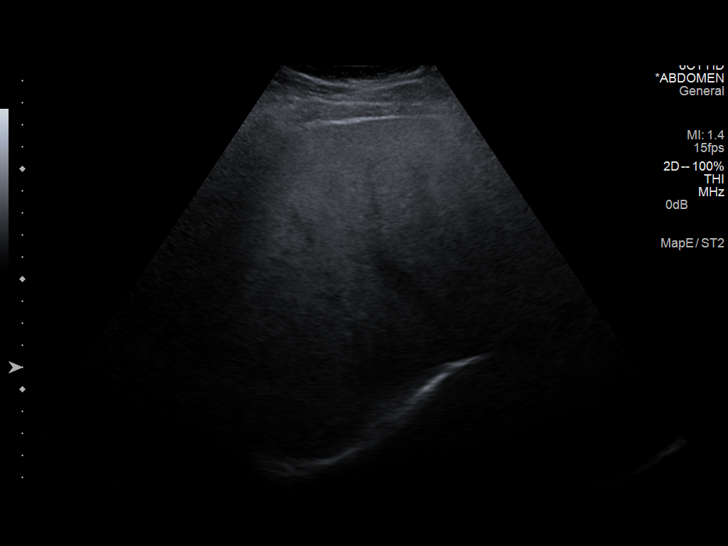
[im 24/72]
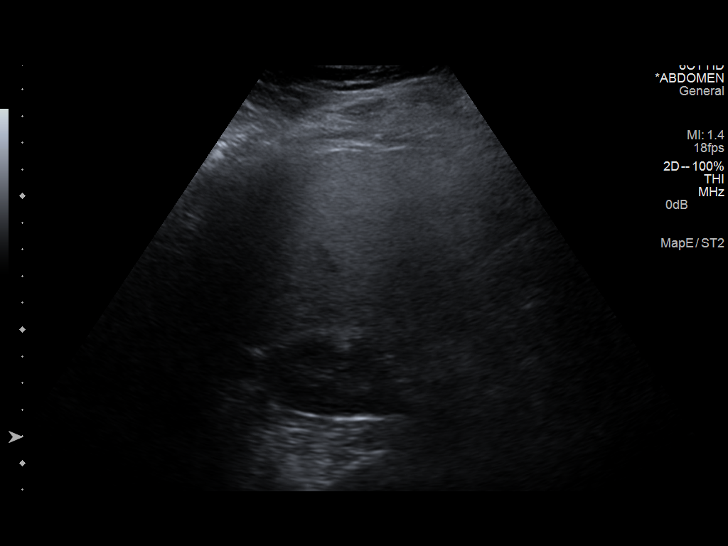
[im 27/72]
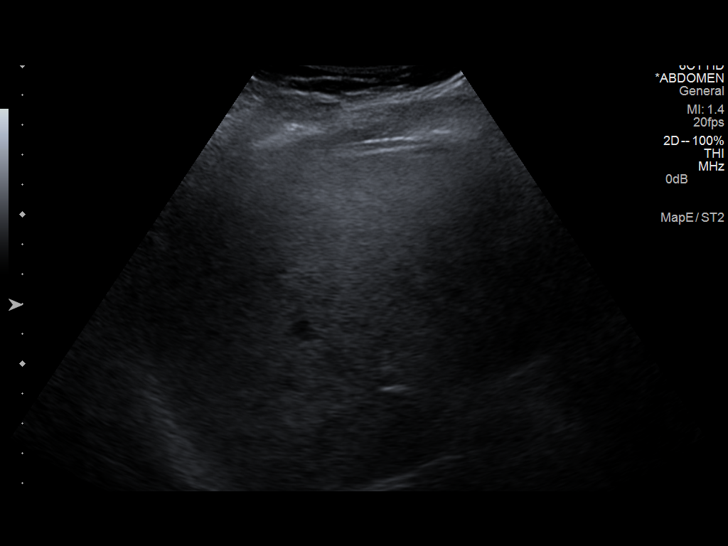
[im 33/72]
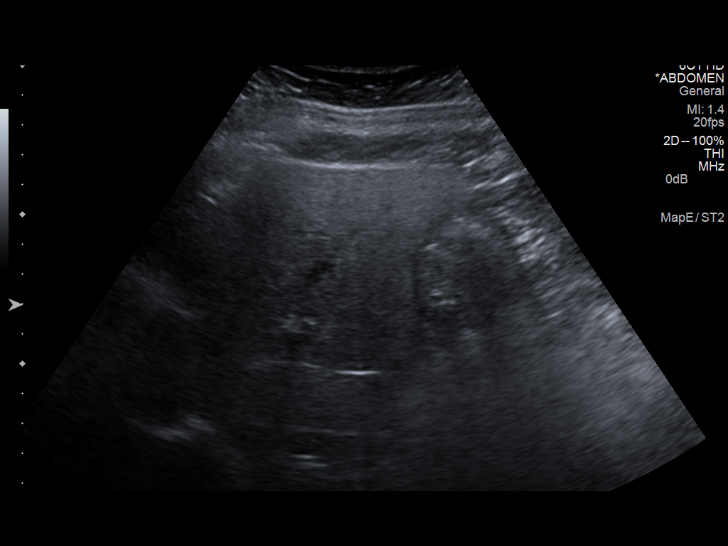
[im 39/72]
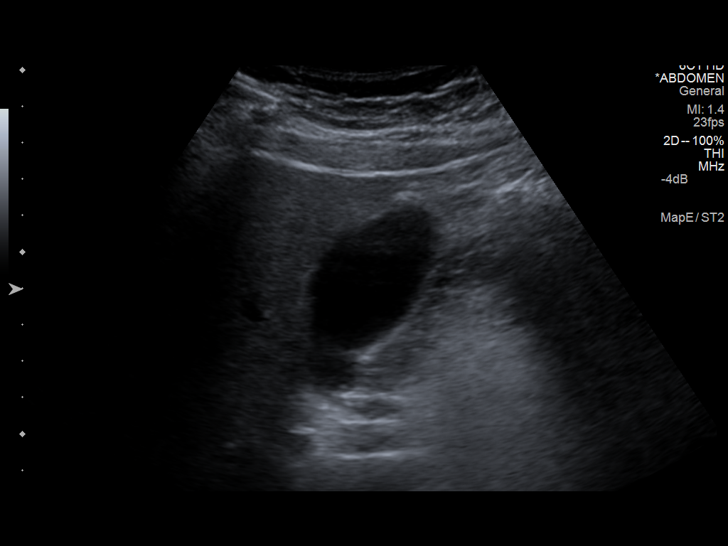
[im 45/72]
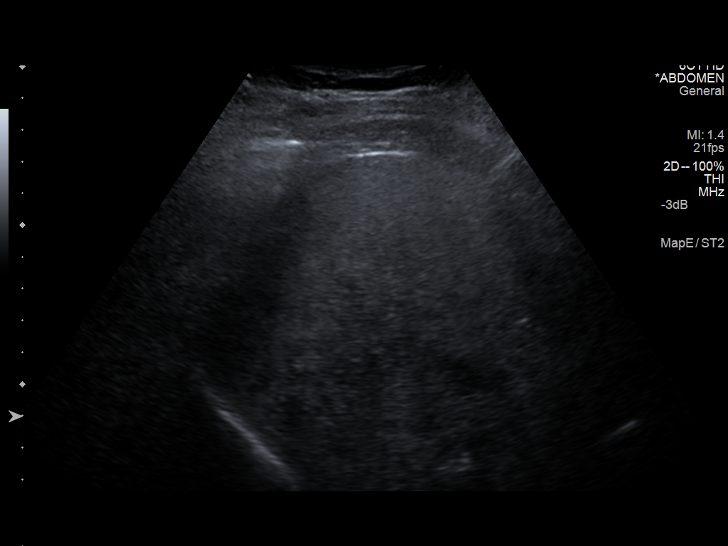
[im 48/72]
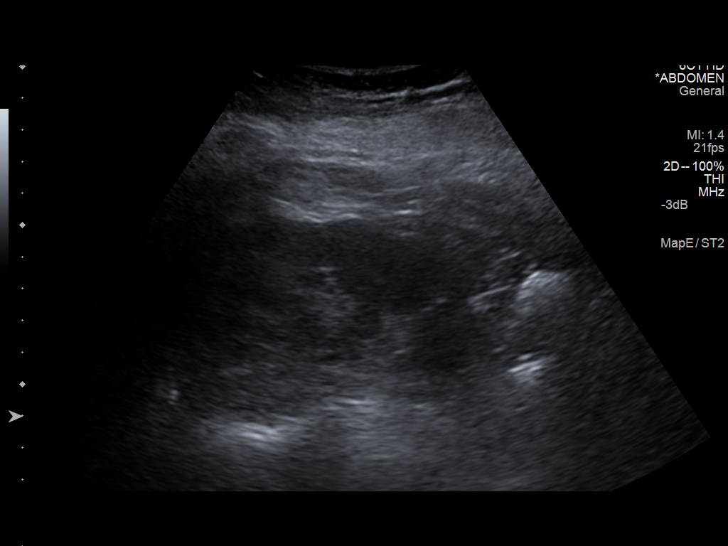
[im 54/72]
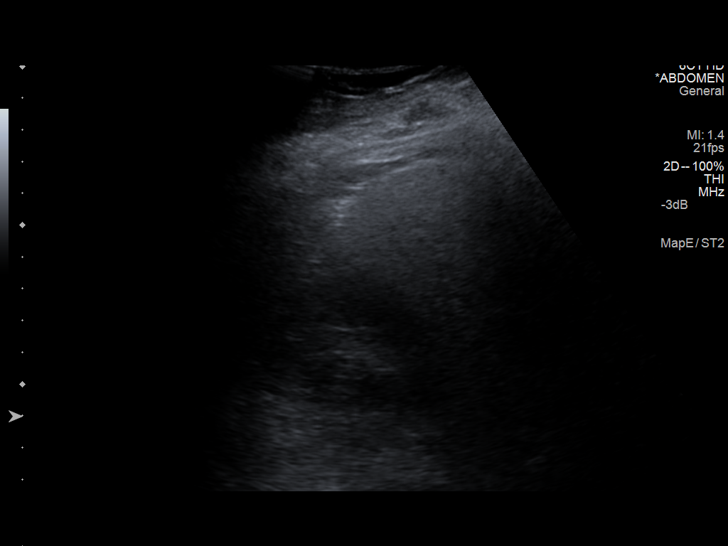
[im 60/72]
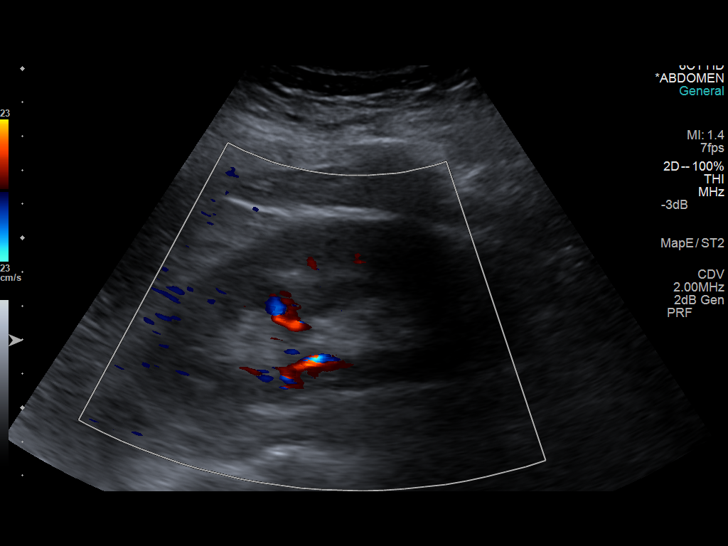
[im 66/72]
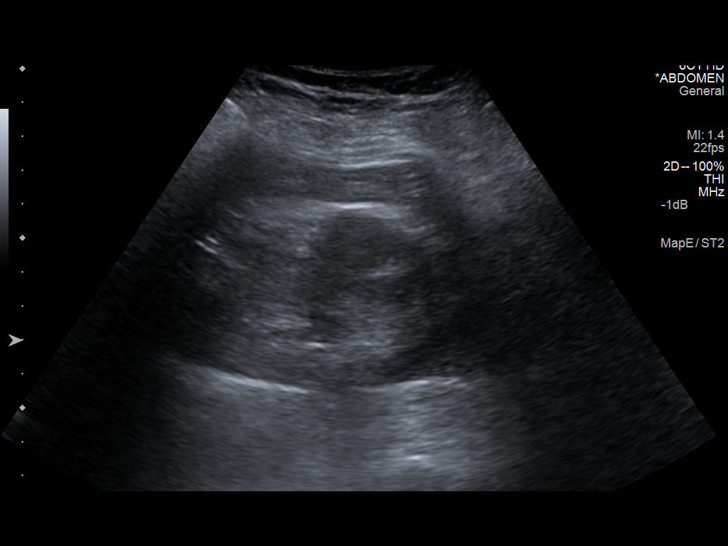
[im 72/72]
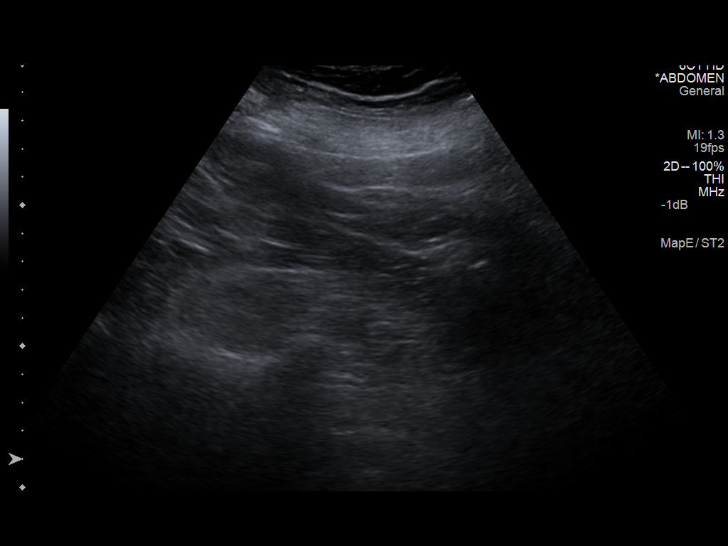

[14 of 25 positions shown; findings below may reference images not displayed]

FINDINGS: Gallbladder

No gallstones or wall thickening. Negative sonographic Murphy's
sign.

Common bile duct

Diameter: Normal. 7 mm in diameter.

Liver

No focal lesions. Diffuse increased echogenicity consistent with
hepatic steatosis.

IVC

No abnormality visualized.

Pancreas

Not visualized due to bowel gas.

Spleen

Removed.

Right Kidney

Length: 11.3 cm. Echogenicity within normal limits. No mass or
hydronephrosis visualized.

Left Kidney

Length: 12.5 cm. Echogenicity within normal limits. No mass or
hydronephrosis visualized.

Abdominal aorta

Limited exam demonstrates maximum diameter of 2.4 cm.
IMPRESSION: 1. No acute abnormalities. Nonvisualization of the pancreas.
2. Hepatic steatosis.

## 2012-10-19 IMAGING — CT CT ABD-PELV W/O CM
1 of 2 series · 16 of 32 positions shown, 20 images · non-contrast
Comparison: None.

CLINICAL DATA: Right-sided flank pain and 3 days

CT ABDOMEN AND PELVIS WITHOUT CONTRAST
TECHNIQUE: Multidetector CT imaging of the abdomen and pelvis was
performed following the standard protocol without intravenous
contrast.

[Series 2: abd/pel w/o · axial · non-contrast · 0.79mm/px · z∈[+1075,+1490]mm · 16 of 91 slices shown, 20 images]
[im 4/91  soft-tissue]
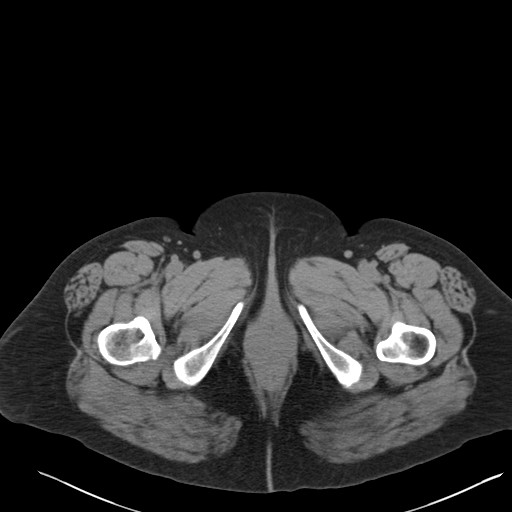
[im 4/91  bone]
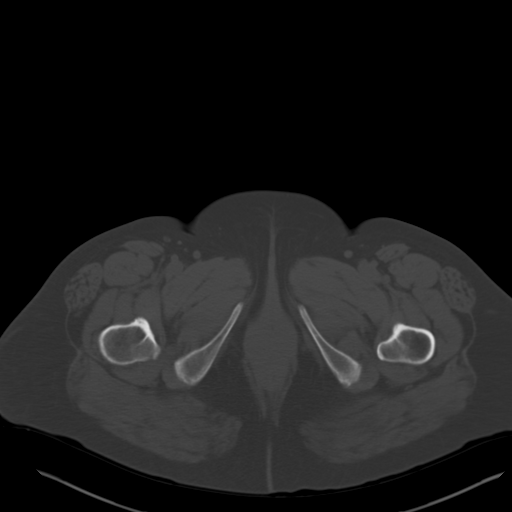
[im 11/91  soft-tissue]
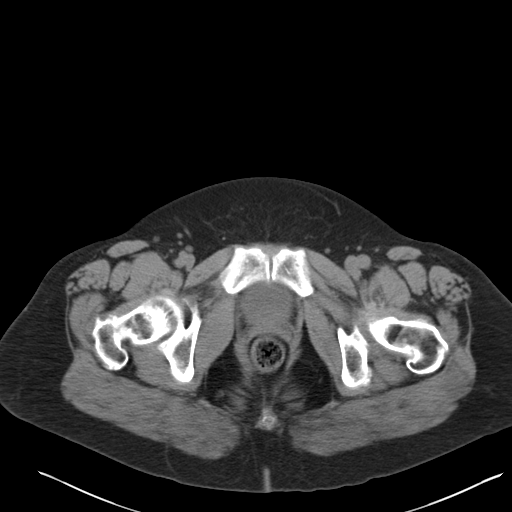
[im 18/91  soft-tissue]
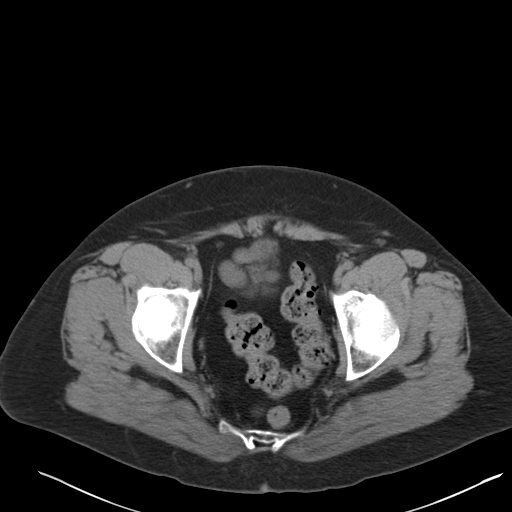
[im 25/91  soft-tissue]
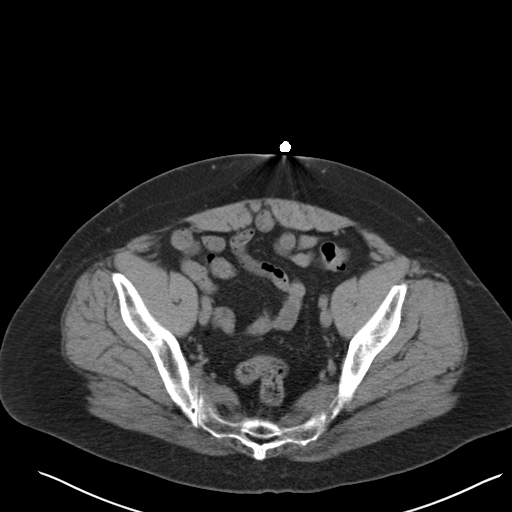
[im 32/91  soft-tissue]
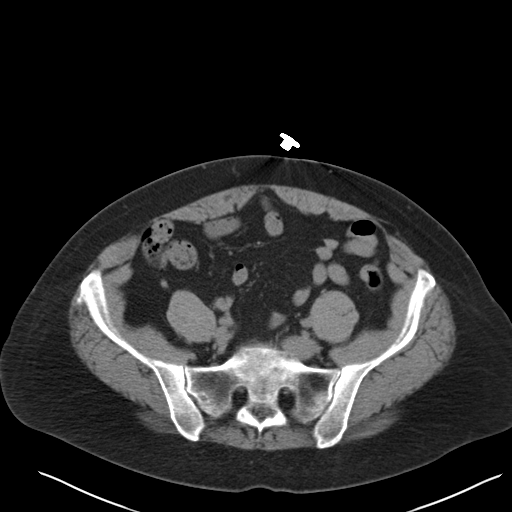
[im 35/91  soft-tissue]
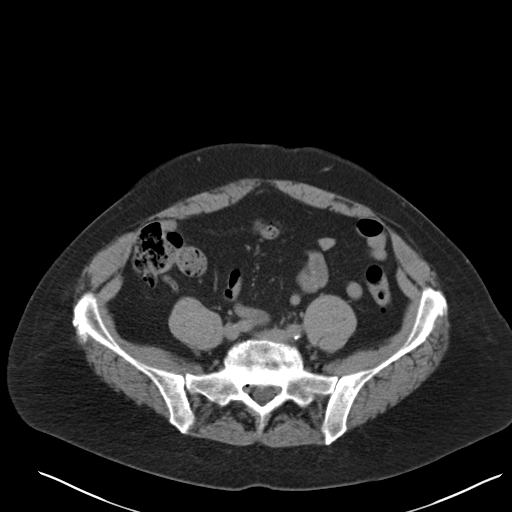
[im 42/91  soft-tissue]
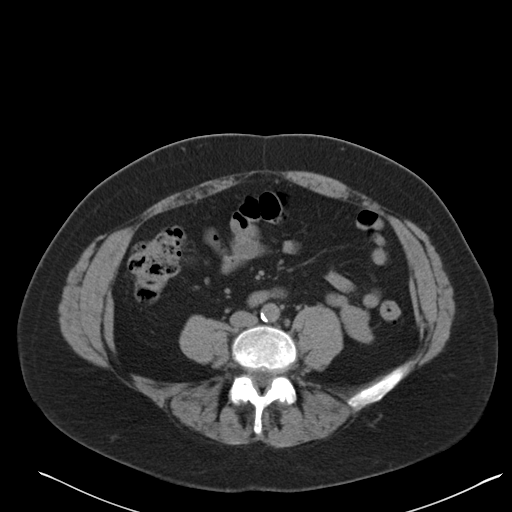
[im 49/91  soft-tissue]
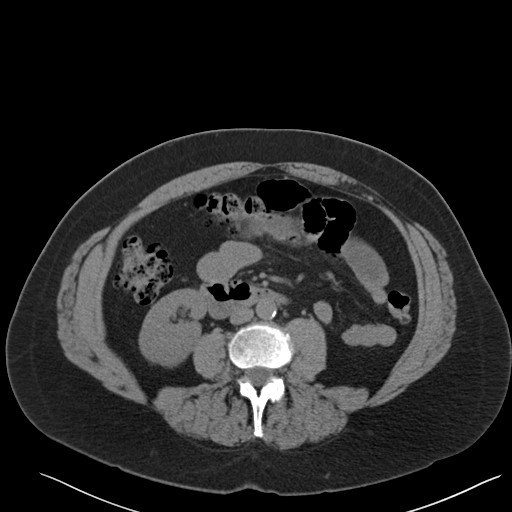
[im 56/91  soft-tissue]
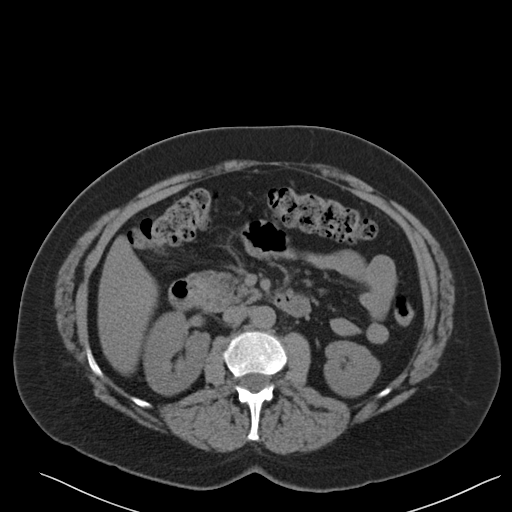
[im 56/91  bone]
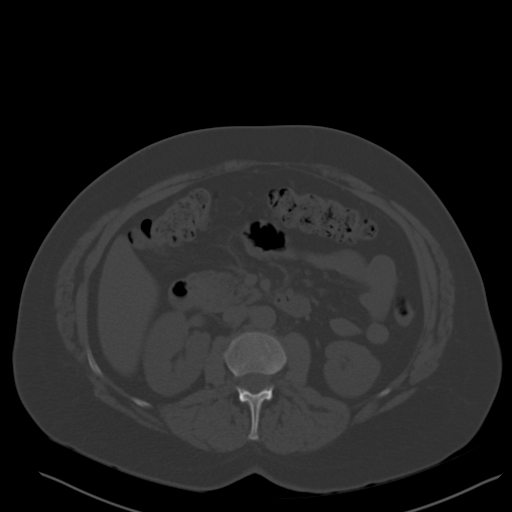
[im 59/91  soft-tissue]
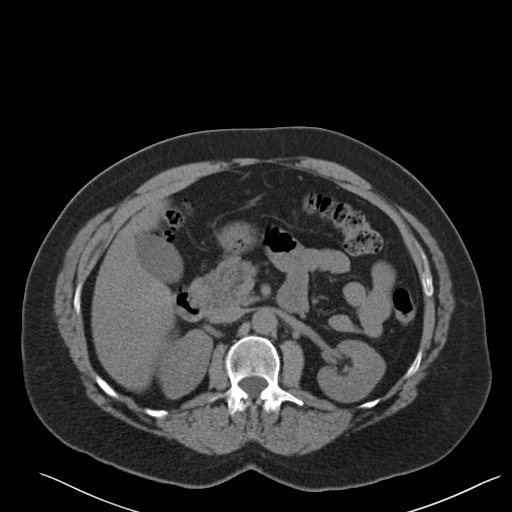
[im 66/91  soft-tissue]
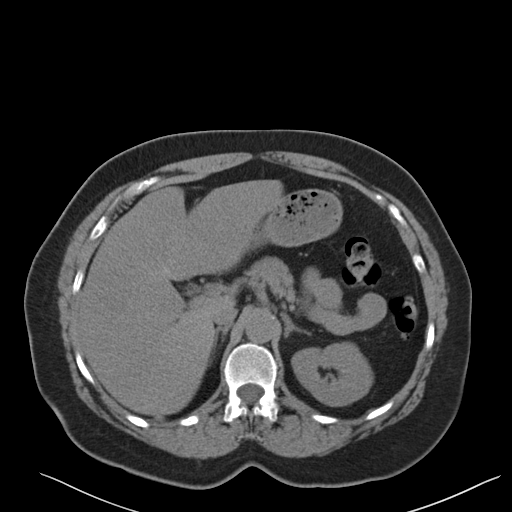
[im 73/91  soft-tissue]
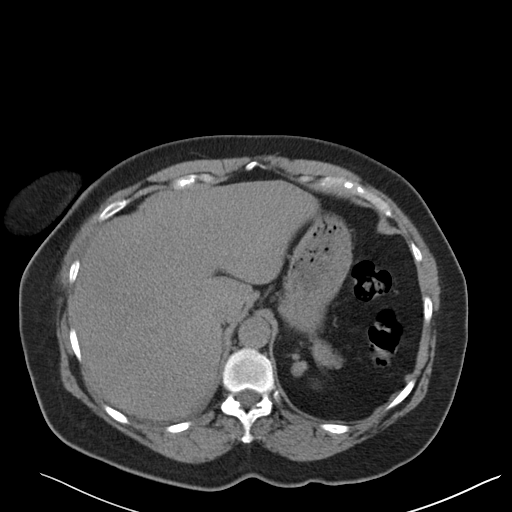
[im 77/91  lung]
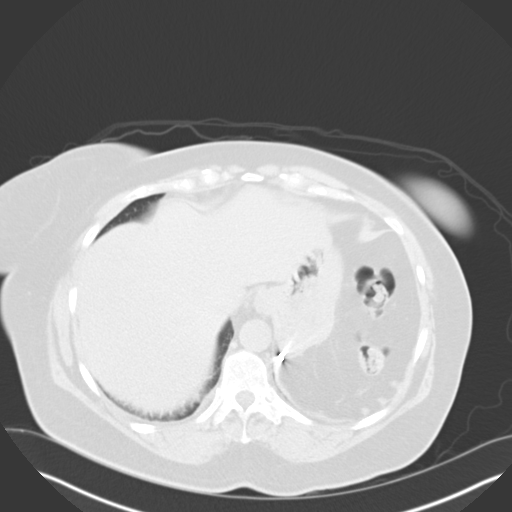
[im 80/91  soft-tissue]
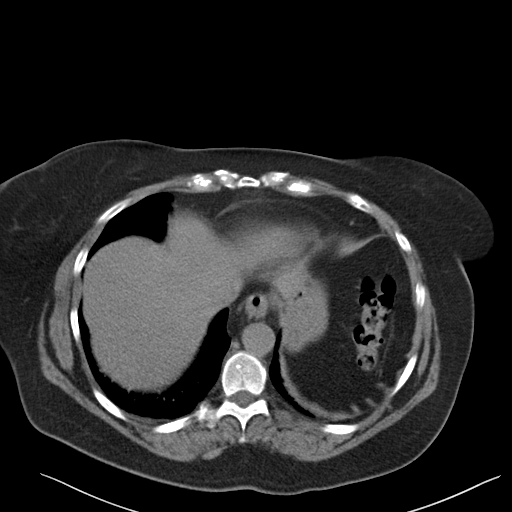
[im 80/91  lung]
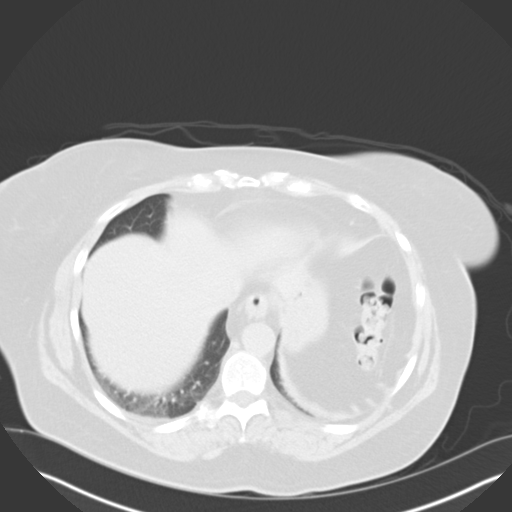
[im 84/91  lung]
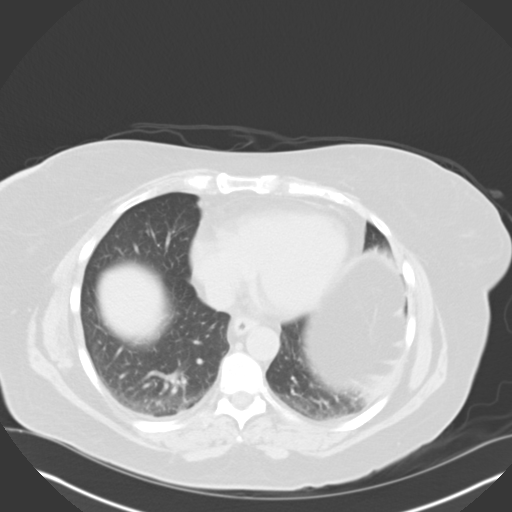
[im 87/91  soft-tissue]
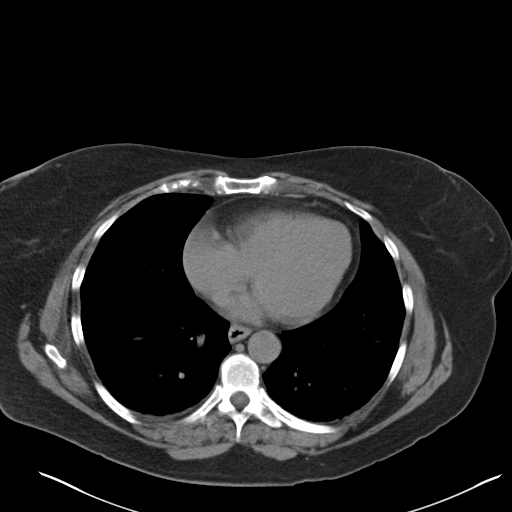
[im 87/91  lung]
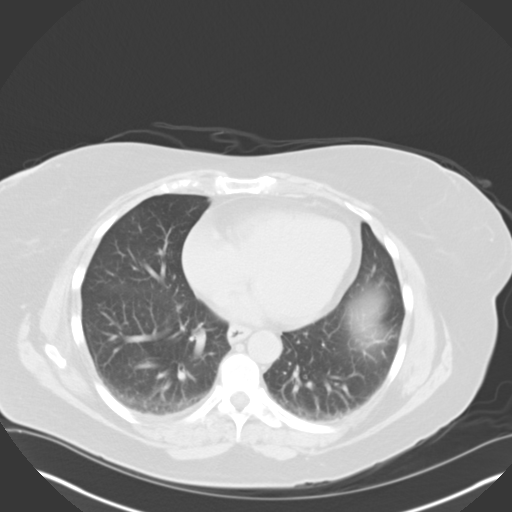

[16 of 32 positions shown; findings below may reference images not displayed]

FINDINGS: Visualized portions of the lung bases are clear.

Liver and gallbladder are normal.  Mild fatty infiltration of the
liver with focal sparing identified.  Spleen is surgically absent.
Small splenules seen in the left upper quadrant.  Pancreas normal.
Adrenal glands normal.  Left kidney normal.  Right kidney normal.
Bladder normal.

Mild diverticulosis of the sigmoid colon and descending colon
without evidence of diverticulitis.  Appendix normal.  Reproductive
organs not identified.  No free fluid.

Bone windows show no acute findings.
IMPRESSION: No acute abnormalities.

## 2012-10-19 MED ORDER — ONDANSETRON HCL 4 MG/2ML IJ SOLN
4.0000 mg | Freq: Once | INTRAMUSCULAR | Status: AC
  Administered 2012-10-19: 4 mg via INTRAVENOUS
  Filled 2012-10-19: qty 2

## 2012-10-19 MED ORDER — RIVAROXABAN 15 MG PO TABS: 15.0000 mg | ORAL_TABLET | Freq: Every day | ORAL | Status: DC

## 2012-10-19 MED ORDER — HYDROMORPHONE HCL PF 1 MG/ML IJ SOLN
1.0000 mg | Freq: Once | INTRAMUSCULAR | Status: AC
  Administered 2012-10-19: 1 mg via INTRAVENOUS
  Filled 2012-10-19: qty 1

## 2012-10-19 MED ORDER — IOHEXOL 300 MG/ML  SOLN
100.0000 mL | Freq: Once | INTRAMUSCULAR | Status: AC | PRN
  Administered 2012-10-19: 100 mL via INTRAVENOUS

## 2012-10-19 MED ORDER — LEVOFLOXACIN 750 MG PO TABS: 750.0000 mg | ORAL_TABLET | Freq: Every day | ORAL | Status: DC

## 2012-10-19 MED ORDER — IPRATROPIUM BROMIDE 0.02 % IN SOLN
0.5000 mg | RESPIRATORY_TRACT | Status: DC
  Administered 2012-10-20: 0.5 mg via RESPIRATORY_TRACT
  Filled 2012-10-19 (×2): qty 2.5

## 2012-10-19 MED ORDER — GI COCKTAIL ~~LOC~~
30.0000 mL | Freq: Once | ORAL | Status: AC
  Administered 2012-10-19: 30 mL via ORAL
  Filled 2012-10-19: qty 30

## 2012-10-19 MED ORDER — ALBUTEROL SULFATE HFA 108 (90 BASE) MCG/ACT IN AERS: 2.0000 | INHALATION_SPRAY | RESPIRATORY_TRACT | Status: DC | PRN

## 2012-10-19 MED ORDER — ALBUTEROL SULFATE (5 MG/ML) 0.5% IN NEBU
2.5000 mg | INHALATION_SOLUTION | RESPIRATORY_TRACT | Status: DC
  Administered 2012-10-20: 2.5 mg via RESPIRATORY_TRACT
  Filled 2012-10-19 (×2): qty 0.5

## 2012-10-19 MED ORDER — LEVOFLOXACIN IN D5W 750 MG/150ML IV SOLN
750.0000 mg | Freq: Once | INTRAVENOUS | Status: AC
  Administered 2012-10-19: 750 mg via INTRAVENOUS
  Filled 2012-10-19: qty 150

## 2012-10-19 MED ORDER — IOHEXOL 300 MG/ML  SOLN
50.0000 mL | Freq: Once | INTRAMUSCULAR | Status: AC | PRN
  Administered 2012-10-19: 50 mL via ORAL

## 2012-10-19 NOTE — ED Provider Notes (Signed)
CSN: [id number]     Arrival date & time 10/19/12  1524 History   First MD Initiated Contact with Patient 10/19/12 1735     Chief Complaint  Patient presents with  . Flank Pain   (Consider location/radiation/quality/duration/timing/severity/associated sxs/prior Treatment) Patient is a 62 y.o. female presenting with abdominal pain. The history is provided by the patient.  Abdominal Pain Pain location:  RUQ Pain quality: aching, dull and sharp   Pain radiates to:  R flank Pain severity:  Severe Onset quality:  Gradual Duration:  2 days Timing:  Constant Progression:  Worsening Chronicity:  New Context: not awakening from sleep, not eating, not sick contacts, not suspicious food intake and not trauma   Associated symptoms: nausea   Associated symptoms: no cough, no fever, no shortness of breath and no vomiting     Past Medical History  Diagnosis Date  . Allergy   . Cataract   . Clotting disorder   . ITP (idiopathic thrombocytopenic purpura)    Past Surgical History  Procedure Laterality Date  . Abdominal hysterectomy    . Spleen removal     Family History  Problem Relation Age of Onset  . Cancer Mother   . Hypertension Mother   . Hypertension Sister   . Hypertension Brother   . Diabetes Brother   . Hypertension Sister   . Hypertension Brother   . Diabetes Brother   . Hypertension Brother   . Diabetes Brother   . Hypertension Brother   . Diabetes Brother    History  Substance Use Topics  . Smoking status: Current Every Day Smoker -- 0.50 packs/day  . Smokeless tobacco: Not on file  . Alcohol Use: No   OB History   Grav Para Term Preterm Abortions TAB SAB Ect Mult Living                 Review of Systems  Constitutional: Negative for fever.  Respiratory: Negative for cough and shortness of breath.   Gastrointestinal: Positive for nausea and abdominal pain. Negative for vomiting.  All other systems reviewed and are negative.    Allergies  Aspirin;  Nsaids; and Tylenol  Home Medications   Current Outpatient Rx  Name  Route  Sig  Dispense  Refill  . carboxymethylcellulose (REFRESH PLUS) 0.5 % SOLN   Ophthalmic   Apply 1 drop to eye 3 (three) times daily as needed (for dry eyes).         . Multiple Vitamin (MULTIVITAMIN WITH MINERALS) TABS tablet   Oral   Take 1 tablet by mouth daily.         . rosuvastatin (CRESTOR) 5 MG tablet   Oral   Take 5 mg by mouth at bedtime.           BP 121/70  Pulse 105  Temp(Src) 98.7 F (37.1 C) (Oral)  SpO2 93% Physical Exam  Nursing note and vitals reviewed. Constitutional: She is oriented to person, place, and time. She appears well-developed and well-nourished. No distress.  HENT:  Head: Normocephalic and atraumatic.  Eyes: EOM are normal. Pupils are equal, round, and reactive to light.  Neck: Normal range of motion. Neck supple.  Cardiovascular: Normal rate and regular rhythm.  Exam reveals no friction rub.   No murmur heard. Pulmonary/Chest: Effort normal and breath sounds normal. No respiratory distress. She has no wheezes. She has no rales.  Abdominal: Soft. She exhibits no distension. There is tenderness (RUQ, R flank, epigastrum). There is guarding (RUQ). There  is no rebound.  Musculoskeletal: Normal range of motion. She exhibits no edema.  Neurological: She is alert and oriented to person, place, and time.  Skin: She is not diaphoretic.    ED Course  Procedures (including critical care time) Labs Review Labs Reviewed  URINALYSIS, ROUTINE W REFLEX MICROSCOPIC - Abnormal; Notable for the following:    APPearance CLOUDY (*)    Specific Gravity, Urine 1.032 (*)    Bilirubin Urine SMALL (*)    Leukocytes, UA SMALL (*)    All other components within normal limits  CBC - Abnormal; Notable for the following:    WBC 17.0 (*)    Platelets 559 (*)    All other components within normal limits  COMPREHENSIVE METABOLIC PANEL - Abnormal; Notable for the following:    Glucose,  Bld 101 (*)    Total Bilirubin 0.2 (*)    GFR calc non Af Amer 77 (*)    GFR calc Af Amer 89 (*)    All other components within normal limits  URINE MICROSCOPIC-ADD ON - Abnormal; Notable for the following:    Crystals CA OXALATE CRYSTALS (*)    All other components within normal limits  LIPASE, BLOOD   Imaging Review Dg Ribs Unilateral W/chest Right  10/19/2012   CLINICAL DATA:  Rib pain.  EXAM: RIGHT RIBS AND CHEST - 3+ VIEW  COMPARISON:  None.  FINDINGS: No fracture or other bone lesions are seen involving the ribs. There is no evidence of pneumothorax or pleural effusion. Both lungs are clear. Heart size and mediastinal contours are within normal limits.  IMPRESSION: Negative.   Electronically Signed   By: Geanie Cooley   On: 10/19/2012 16:43    MDM   1. Bronchitis   2. Hypoxia   3. Abdominal pain    30F presents with RUQ, R flank pain. Began 2 days ago, denies fevers, vomiting. Radiating around to R flank. No other abdominal pain. Worse with coughing, movements.  Tachycardic, in pain here. RUQ, epigastric, R sided abdominal pain. No other abdominal pain. With initiation in RUQ and radiation around to back, concern for gallbladder pathology. Will obtain US, if negative, will proceed with CT scan. Of note, had hypoxia after dilaudid, placed on oxygen. [place] negative. Ordered CT scan since patient has Ca Oxalate crystal - Noncon CT negative. I spoke with Radiology. They state they feel a CT with contrast would be beneficial with such severe pain. Patient's CT with contrast normal. CXR obtained, no PNA, but has bronchitic changes. I spoke with patient. She states allergies twice a year causing cough, usually requiring antibiotics. Here, states some cough, no hemoptysis, no SOB. She is hypoxic on room air, trending down to the high 80s, low 90s. No diagnosis of COPD, not on inhalers. When talking to me, she desats to 88-89%. I spoke with Dr. Gwenlyn Perking on the phone - he states CURB65 score 1, with  lower normal sats at rest and no SOB, patient will be safe for discharge. Patient cannot get another dose of antibiotics for 24 hours, will likely be discharged prior to that point. Patient wants to go home, feels safe there, given strict return precautions. I feel discharge is reasonable. No SOB, no wheezing. Given albuterol to go home, given levaquin Rx, strict return precautions, and instructed PCP f/u.  Dagmar Hait, MD 10/19/12 [phone]

## 2012-10-19 NOTE — Progress Notes (Addendum)
[facility] [address] [place] [address] [phone]  Date:  10/19/2012   Name:  Sabrina Knight   DOB:  [birth year]   MRN:  [id number]  PCP:  No primary provider on file.    Chief Complaint: upper right abd pain   History of Present Illness:  Sabrina Knight is a 62 y.o. very pleasant female patient who presents with the following:  Here as a new pt today.  History of ITP that resulted in a splenectomy, otherwise generally healthy. History of hysterectomy and spleenectomy.  No other surgical history  She tends to have allergies and coughing twice a year.   She recently had return of these sx- about one week ago.  She uses mucinex.  She tends to have a "strong cough," but non- productive.  Yesterday she noted pain in her RUQ,right side, right side of her back.  She feels the pain most right under her breast on the right side.  This morning she awoke with a "burning" feeling in her RUQ that was quite painful.    She has not noted a fever.  She is eating well.  No vomiting, she had a little bit of nausea over the weekend but this resolved.   She has not had RUQ pain in the pst  There are no active problems to display for this patient.   Past Medical History  Diagnosis Date  . Allergy   . Cataract   . Clotting disorder     Past Surgical History  Procedure Laterality Date  . Abdominal hysterectomy    . Spleen removal      History  Substance Use Topics  . Smoking status: Current Every Day Smoker  . Smokeless tobacco: Not on file  . Alcohol Use: No    Family History  Problem Relation Age of Onset  . Cancer Mother   . Hypertension Mother   . Hypertension Sister   . Hypertension Brother   . Diabetes Brother   . Hypertension Sister   . Hypertension Brother   . Diabetes Brother   . Hypertension Brother   . Diabetes Brother   . Hypertension Brother   . Diabetes Brother     No Known Allergies  Medication list has been reviewed and  updated.  No current outpatient prescriptions on file prior to visit.   No current facility-administered medications on file prior to visit.    Review of Systems:  As per HPI- otherwise negative.   Physical Examination: Filed Vitals:   10/19/12 1359  BP: 110/72  Pulse: 113  Temp: 98.1 F (36.7 C)  Resp: 18   Filed Vitals:   10/19/12 1359  Height: 5' 5.5" (1.664 m)  Weight: 179 lb (81.194 kg)   Body mass index is 29.32 kg/(m^2). Ideal Body Weight: Weight in (lb) to have BMI = 25: 152.2  GEN: WDWN, NAD, Non-toxic, A & O x 3. Appears uncomfortable, overweight HEENT: Atraumatic, Normocephalic. Neck supple. No masses, No LAD.  Bilateral TM wnl, oropharynx normal.  PEERL,EOMI.   Ears and Nose: No external deformity. CV: RRR- mild tachycardia, No M/G/R. No JVD. No thrill. No extra heart sounds. PULM: CTA B, no wheezes, crackles, rhonchi. No retractions. No resp. distress. No accessory muscle use. ABD: S, ND +BS. No rebound. No HSM.  She has exquisite RUQ tenderness and some guarding.  She is also tender over the lower right ribs- cannot determine exact origin of her tenderness.   EXTR: No  c/c/e NEURO Normal gait.  PSYCH: Normally interactive. Conversant. Not depressed or anxious appearing.  Calm demeanor.  She also has tenderness and muscular spasm in the left lower back and right upper back.    Results for orders placed in visit on 10/19/12  POCT CBC      Result Value Range   WBC 16.2 (*) 4.6 - 10.2 K/uL   Lymph, poc 5.7 (*) 0.6 - 3.4   POC LYMPH PERCENT 35.2  10 - 50 %L   MID (cbc) 1.1 (*) 0 - 0.9   POC MID % 6.6  0 - 12 %M   POC Granulocyte 9.4 (*) 2 - 6.9   Granulocyte percent 58.2  37 - 80 %G   RBC 4.87  4.04 - 5.48 M/uL   Hemoglobin 15.4  12.2 - 16.2 g/dL   HCT, POC 78.2  95.6 - 47.9 %   MCV 98.0 (*) 80 - 97 fL   MCH, POC 31.6 (*) 27 - 31.2 pg   MCHC 32.3  31.8 - 35.4 g/dL   RDW, POC 21.3     Platelet Count, POC 576 (*) 142 - 424 K/uL   MPV 9.3  0 - 99.8 fL    [facility] reading (PRIMARY) by  Dr. Patsy Lager. Ribs and 2 view CXR: negative  Assessment and Plan: Rib pain - Plan: DG Ribs Unilateral W/Chest Right  Cough  RUQ pain - Plan: POCT CBC  Sabrina Knight is here today as a new pt. She has RUQ tenderness and leukocytosis.  Possible acute cholecystitis vs other abdominal pathology. May also be MSK, but I am concerned with how uncomfortable she appears.  She will travel to [facility] for further evaluation.  Appreciate ED care of this nice patient.   Signed Abbe Amsterdam, MD

## 2012-10-19 NOTE — ED Notes (Signed)
Patient transported to CT 

## 2012-10-19 NOTE — Patient Instructions (Addendum)
Please proceed to the [facility] emergency room. I will give them a call.

## 2012-10-19 NOTE — ED Notes (Signed)
Pt states that she woke up three days ago in the night and had R sided flank pain. Has had a dry coughing but WBC count was high at [facility] so they sent her here. Alert and oriented. Denies CP/sob.

## 2012-10-19 NOTE — ED Notes (Signed)
Pt being sent from urgent care, for WBC 16 and possible acute gall bladder

## 2012-10-19 NOTE — Progress Notes (Signed)
Patient confirms her pcp is Dr. Blair Heys of [facility].  System updated.

## 2012-10-20 ENCOUNTER — Telehealth: Payer: Self-pay | Admitting: Family Medicine

## 2012-10-20 NOTE — Telephone Encounter (Signed)
Called to check on her.  She is on Levaquin, and has an inhaler and is feeling better.

## 2012-10-20 NOTE — ED Notes (Signed)
Pt waiting for antibiotic to finish infusing and then can be discharged.

## 2013-02-01 ENCOUNTER — Ambulatory Visit (INDEPENDENT_AMBULATORY_CARE_PROVIDER_SITE_OTHER): Payer: BC Managed Care – PPO | Admitting: Family Medicine

## 2013-02-01 ENCOUNTER — Encounter: Payer: Self-pay | Admitting: Family Medicine

## 2013-02-01 VITALS — BP 120/76 | HR 78 | Temp 98.0°F | Resp 16 | Ht 65.0 in | Wt 179.6 lb

## 2013-02-01 DIAGNOSIS — J310 Chronic rhinitis: Secondary | ICD-10-CM

## 2013-02-01 DIAGNOSIS — D751 Secondary polycythemia: Secondary | ICD-10-CM

## 2013-02-01 DIAGNOSIS — H409 Unspecified glaucoma: Secondary | ICD-10-CM

## 2013-02-01 DIAGNOSIS — R809 Proteinuria, unspecified: Secondary | ICD-10-CM

## 2013-02-01 DIAGNOSIS — Z23 Encounter for immunization: Secondary | ICD-10-CM

## 2013-02-01 DIAGNOSIS — Z9081 Acquired absence of spleen: Secondary | ICD-10-CM

## 2013-02-01 DIAGNOSIS — E7800 Pure hypercholesterolemia, unspecified: Secondary | ICD-10-CM

## 2013-02-01 DIAGNOSIS — Z0000 Encounter for general adult medical examination without abnormal findings: Secondary | ICD-10-CM

## 2013-02-01 LAB — CBC WITH DIFFERENTIAL/PLATELET
Basophils Absolute: 0 10*3/uL (ref 0.0–0.1)
Basophils Relative: 0 % (ref 0–1)
EOS PCT: 2 % (ref 0–5)
Eosinophils Absolute: 0.2 10*3/uL (ref 0.0–0.7)
HCT: 45.3 % (ref 36.0–46.0)
HEMOGLOBIN: 15.5 g/dL — AB (ref 12.0–15.0)
LYMPHS ABS: 4 10*3/uL (ref 0.7–4.0)
LYMPHS PCT: 38 % (ref 12–46)
MCH: 31.2 pg (ref 26.0–34.0)
MCHC: 34.2 g/dL (ref 30.0–36.0)
MCV: 91.1 fL (ref 78.0–100.0)
MONO ABS: 0.9 10*3/uL (ref 0.1–1.0)
MONOS PCT: 9 % (ref 3–12)
Neutro Abs: 5.4 10*3/uL (ref 1.7–7.7)
Neutrophils Relative %: 51 % (ref 43–77)
Platelets: 590 10*3/uL — ABNORMAL HIGH (ref 150–400)
RBC: 4.97 MIL/uL (ref 3.87–5.11)
RDW: 13.7 % (ref 11.5–15.5)
WBC: 10.5 10*3/uL (ref 4.0–10.5)

## 2013-02-01 LAB — POCT URINALYSIS DIPSTICK
GLUCOSE UA: NEGATIVE
KETONES UA: NEGATIVE
Leukocytes, UA: NEGATIVE
Nitrite, UA: NEGATIVE
Protein, UA: 30
RBC UA: NEGATIVE
Urobilinogen, UA: 0.2
pH, UA: 5.5

## 2013-02-01 LAB — COMPREHENSIVE METABOLIC PANEL
ALT: 26 U/L (ref 0–35)
AST: 22 U/L (ref 0–37)
Albumin: 4.3 g/dL (ref 3.5–5.2)
Alkaline Phosphatase: 97 U/L (ref 39–117)
BUN: 14 mg/dL (ref 6–23)
CO2: 24 meq/L (ref 19–32)
Calcium: 9.7 mg/dL (ref 8.4–10.5)
Chloride: 106 mEq/L (ref 96–112)
Creat: 0.83 mg/dL (ref 0.50–1.10)
GLUCOSE: 104 mg/dL — AB (ref 70–99)
Potassium: 4.4 mEq/L (ref 3.5–5.3)
SODIUM: 142 meq/L (ref 135–145)
TOTAL PROTEIN: 7.2 g/dL (ref 6.0–8.3)
Total Bilirubin: 0.4 mg/dL (ref 0.3–1.2)

## 2013-02-01 LAB — LIPID PANEL
Cholesterol: 181 mg/dL (ref 0–200)
HDL: 48 mg/dL (ref >39)
LDL Cholesterol: 92 mg/dL (ref 0–99)
Total CHOL/HDL Ratio: 3.8 Ratio
Triglycerides: 206 mg/dL — ABNORMAL HIGH (ref <150)
VLDL: 41 mg/dL — ABNORMAL HIGH (ref 0–40)

## 2013-02-01 LAB — TSH: TSH: 1.083 u[IU]/mL (ref 0.350–4.500)

## 2013-02-01 MED ORDER — ROSUVASTATIN CALCIUM 10 MG PO TABS: 5.0000 mg | ORAL_TABLET | Freq: Every day | ORAL | Status: DC

## 2013-02-01 NOTE — Progress Notes (Signed)
[facility] [address], [place] [address] [phone]  Date:  02/01/2013   Name:  Sabrina Knight   DOB:  1950/04/15   MRN:  [id number]  PCP:  Simona Huh, MD    Chief Complaint: Annual Exam   History of Present Illness:  Sabrina Knight is a 63 y.o. very pleasant female patient who presents with the following:  Here today for a CPE. She has a history of ITP that resulted in a splenectomy in the 1990s.  Otherwise she has been generally healthy.  Seen in September at which time she was sent to the ER with what turned out to be bronchitis. otherwise she has been quite healthy   She exercises regularly but knows she should lose a few lbs.   She had a total hyst for benign causes. She does NOT have a cervix  Mammogram done annually at a location on [address]- she is not certain of the name but it is UTD.   Her colonoscopy was done in 2013.  Flu shot done this year already.   She is fasting this am for labs.   She had her splenectomy in 1996- she also had a pneumovax then.    She does have narrow angle glaucoma- no drops needed at this time.  Dr. Herbert Deaner is her optho.  They plan to remove her cataracts at some point but right now they are not bothersome.    She does see an allergist who has her on qvar, singulare, zyurtec or clariti and patanase spray Also crestor for high cholesterol  There are no active problems to display for this patient.   Past Medical History  Diagnosis Date  . Allergy   . Cataract   . Clotting disorder   . ITP (idiopathic thrombocytopenic purpura)     Past Surgical History  Procedure Laterality Date  . Abdominal hysterectomy    . Spleen removal    . Spleen removal      History  Substance Use Topics  . Smoking status: Current Every Day Smoker -- 0.50 packs/day  . Smokeless tobacco: Not on file  . Alcohol Use: No    Family History  Problem Relation Age of Onset  . Cancer Mother   . Hypertension  Mother   . Hypertension Sister   . Hypertension Brother   . Diabetes Brother   . Hypertension Sister   . Hypertension Brother   . Diabetes Brother   . Hypertension Brother   . Diabetes Brother   . Hypertension Brother   . Diabetes Brother     Allergies  Allergen Reactions  . Aspirin     Can not take  . Nsaids     Can not take   . Tylenol [Acetaminophen]     Can not take    Medication list has been reviewed and updated.  Current Outpatient Prescriptions on File Prior to Visit  Medication Sig Dispense Refill  . carboxymethylcellulose (REFRESH PLUS) 0.5 % SOLN Apply 1 drop to eye 3 (three) times daily as needed (for dry eyes).      . Multiple Vitamin (MULTIVITAMIN WITH MINERALS) TABS tablet Take 1 tablet by mouth daily.      . rosuvastatin (CRESTOR) 5 MG tablet Take 5 mg by mouth at bedtime.       Marland Kitchen albuterol (PROVENTIL HFA;VENTOLIN HFA) 108 (90 BASE) MCG/ACT inhaler Inhale 2 puffs into the lungs every 4 (four) hours as needed for wheezing.  2 Inhaler  0   No current facility-administered medications on file prior to visit.    Review of Systems:  As per HPI- otherwise negative.   Physical Examination: Filed Vitals:   02/01/13 0808  BP: 120/76  Pulse: 78  Temp: 98 F (36.7 C)  Resp: 16   Filed Vitals:   02/01/13 0808  Height: 5\' 5"  (1.651 m)  Weight: 179 lb 9.6 oz (81.466 kg)   Body mass index is 29.89 kg/(m^2). Ideal Body Weight: Weight in (lb) to have BMI = 25: 149.9  GEN: WDWN, NAD, Non-toxic, A & O x 3, looks well, overweight HEENT: Atraumatic, Normocephalic. Neck supple. No masses, No LAD.  Bilateral TM wnl, oropharynx normal.  PEERL,EOMI.   Ears and Nose: No external deformity. CV: RRR, No M/G/R. No JVD. No thrill. No extra heart sounds. PULM: CTA B, no wheezes, crackles, rhonchi. No retractions. No resp. distress. No accessory muscle use. ABD: S, NT, ND, +BS. No rebound. No HSM. EXTR: No c/c/e NEURO Normal gait.  PSYCH: Normally interactive.  Conversant. Not depressed or anxious appearing.  Calm demeanor.  Breast: normal exam, no masses, dimpling or discharge  Assessment and Plan: Physical exam - Plan: Comprehensive metabolic panel, TSH, Lipid panel, Pneumococcal polysaccharide vaccine 23-valent greater than or equal to 2yo subcutaneous/IM, CBC with Differential, Tdap vaccine greater than or equal to 7yo IM, POCT urinalysis dipstick, CANCELED: POCT CBC  High cholesterol - Plan: rosuvastatin (CRESTOR) 10 MG tablet  H/O splenectomy  Non-allergic rhinitis  Glaucoma  Normal PE today.  No pap as she is s/p total hyst. Refilled her crestor Await labs and will follow-up with her Tdap and pneumovax today.  Discussed pneumonia 13- valent. She should get this at some point; however right now this vaccine is not widely available.     Signed Lamar Blinks, MD  Ok to [facility] at home.

## 2013-02-01 NOTE — Patient Instructions (Signed)
Good to see you today. I will be in touch with your labs.  Take care!   Keep an eye out for the pneumonia "13' shot- you should get this at some point when it becomes available.  You may be able to get this with [place] or at a drug store.

## 2013-02-02 ENCOUNTER — Encounter: Payer: Self-pay | Admitting: Family Medicine

## 2013-02-02 NOTE — Addendum Note (Signed)
Addended by: Lamar Blinks C on: 02/02/2013 07:43 PM   Modules accepted: Orders

## 2013-09-13 ENCOUNTER — Encounter: Payer: Self-pay | Admitting: Family Medicine

## 2014-02-07 ENCOUNTER — Ambulatory Visit (INDEPENDENT_AMBULATORY_CARE_PROVIDER_SITE_OTHER): Payer: BLUE CROSS/BLUE SHIELD | Admitting: Family Medicine

## 2014-02-07 ENCOUNTER — Encounter: Payer: Self-pay | Admitting: Family Medicine

## 2014-02-07 VITALS — BP 142/90 | HR 63 | Temp 97.7°F | Resp 16 | Ht 65.0 in | Wt 181.0 lb

## 2014-02-07 DIAGNOSIS — E785 Hyperlipidemia, unspecified: Secondary | ICD-10-CM

## 2014-02-07 DIAGNOSIS — Z23 Encounter for immunization: Secondary | ICD-10-CM

## 2014-02-07 DIAGNOSIS — Z9081 Acquired absence of spleen: Secondary | ICD-10-CM

## 2014-02-07 DIAGNOSIS — M25562 Pain in left knee: Secondary | ICD-10-CM

## 2014-02-07 DIAGNOSIS — IMO0001 Reserved for inherently not codable concepts without codable children: Secondary | ICD-10-CM

## 2014-02-07 DIAGNOSIS — R030 Elevated blood-pressure reading, without diagnosis of hypertension: Secondary | ICD-10-CM

## 2014-02-07 DIAGNOSIS — Z130 Encounter for screening for diseases of the blood and blood-forming organs and certain disorders involving the immune mechanism: Secondary | ICD-10-CM

## 2014-02-07 DIAGNOSIS — Z0000 Encounter for general adult medical examination without abnormal findings: Secondary | ICD-10-CM

## 2014-02-07 DIAGNOSIS — Z1329 Encounter for screening for other suspected endocrine disorder: Secondary | ICD-10-CM

## 2014-02-07 DIAGNOSIS — Z131 Encounter for screening for diabetes mellitus: Secondary | ICD-10-CM

## 2014-02-07 LAB — TSH: TSH: 1.211 u[IU]/mL (ref 0.350–4.500)

## 2014-02-07 LAB — LIPID PANEL
CHOL/HDL RATIO: 6.8 ratio
Cholesterol: 300 mg/dL — ABNORMAL HIGH (ref 0–200)
HDL: 44 mg/dL (ref >39)
LDL Cholesterol: 203 mg/dL — ABNORMAL HIGH (ref 0–99)
Triglycerides: 265 mg/dL — ABNORMAL HIGH (ref <150)
VLDL: 53 mg/dL — ABNORMAL HIGH (ref 0–40)

## 2014-02-07 LAB — CBC WITH DIFFERENTIAL/PLATELET
BASOS ABS: 0 10*3/uL (ref 0.0–0.1)
Basophils Relative: 0 % (ref 0–1)
EOS PCT: 2 % (ref 0–5)
Eosinophils Absolute: 0.2 10*3/uL (ref 0.0–0.7)
HCT: 42.4 % (ref 36.0–46.0)
HEMOGLOBIN: 14.4 g/dL (ref 12.0–15.0)
Lymphocytes Relative: 41 % (ref 12–46)
Lymphs Abs: 4.2 10*3/uL — ABNORMAL HIGH (ref 0.7–4.0)
MCH: 30.8 pg (ref 26.0–34.0)
MCHC: 34 g/dL (ref 30.0–36.0)
MCV: 90.8 fL (ref 78.0–100.0)
MPV: 10.4 fL (ref 8.6–12.4)
Monocytes Absolute: 1 10*3/uL (ref 0.1–1.0)
Monocytes Relative: 10 % (ref 3–12)
Neutro Abs: 4.8 10*3/uL (ref 1.7–7.7)
Neutrophils Relative %: 47 % (ref 43–77)
PLATELETS: 518 10*3/uL — AB (ref 150–400)
RBC: 4.67 MIL/uL (ref 3.87–5.11)
RDW: 13.6 % (ref 11.5–15.5)
WBC: 10.3 10*3/uL (ref 4.0–10.5)

## 2014-02-07 LAB — COMPREHENSIVE METABOLIC PANEL
ALT: 31 U/L (ref 0–35)
AST: 21 U/L (ref 0–37)
Albumin: 4.1 g/dL (ref 3.5–5.2)
Alkaline Phosphatase: 90 U/L (ref 39–117)
BUN: 13 mg/dL (ref 6–23)
CHLORIDE: 105 meq/L (ref 96–112)
CO2: 25 mEq/L (ref 19–32)
CREATININE: 0.77 mg/dL (ref 0.50–1.10)
Calcium: 9.8 mg/dL (ref 8.4–10.5)
Glucose, Bld: 94 mg/dL (ref 70–99)
Potassium: 4.5 mEq/L (ref 3.5–5.3)
SODIUM: 140 meq/L (ref 135–145)
Total Bilirubin: 0.5 mg/dL (ref 0.2–1.2)
Total Protein: 7.1 g/dL (ref 6.0–8.3)

## 2014-02-07 NOTE — Progress Notes (Deleted)
   Subjective:    Patient ID: Sabrina Knight, female    DOB: Feb 14, 1950, 64 y.o.   MRN: [id number]  HPI    Review of Systems  Constitutional: Negative.   HENT: Negative.   Eyes: Negative.   Respiratory: Negative.   Cardiovascular: Negative.   Gastrointestinal: Negative.   Endocrine: Negative.   Genitourinary: Negative.   Musculoskeletal: Positive for arthralgias.  Skin: Negative.   Allergic/Immunologic: Negative.   Neurological: Negative.   Hematological: Negative.   Psychiatric/Behavioral: Negative.        Objective:   Physical Exam        Assessment & Plan:

## 2014-02-07 NOTE — Patient Instructions (Signed)
Great to see you today- I will be in touch with your labs asap. Please do check your BP on occasion at the gym- if you are running higher than 140/85 on a regular basis please give me a call  We will get you back in with your orthopedist for your left knee pain

## 2014-02-07 NOTE — Progress Notes (Signed)
   Subjective:    Patient ID: Sabrina Knight, female    DOB: 08-05-1950, 64 y.o.   MRN: [id number]  HPI    Review of Systems  Constitutional: Negative.   HENT: Negative.   Eyes: Negative.   Respiratory: Negative.   Cardiovascular: Negative.   Gastrointestinal: Negative.   Endocrine: Negative.   Genitourinary: Negative.   Musculoskeletal: Positive for arthralgias.  Skin: Negative.   Allergic/Immunologic: Negative.   Neurological: Negative.   Hematological: Negative.   Psychiatric/Behavioral: Negative.        Objective:   Physical Exam        Assessment & Plan:

## 2014-02-07 NOTE — Progress Notes (Signed)
[facility] [address] [place] [address] [phone]  Date:  02/07/2014   Name:  Sabrina Knight   DOB:  03/29/1950   MRN:  [id number]  PCP:  Simona Huh, MD    Chief Complaint: Annual Exam   History of Present Illness:  Sabrina Knight is a 64 y.o. very pleasant female patient who presents with the following:  Here today for a CPE.  She is s/p hysterectomy for benign causes.   She sees an allergist for seasonal allergies. She is not on allergy shots- she takes singulair and an OTC medication.  She has "non- allergic rhinitis."  Her allergy testing was negative.   She would like to do a flu shot Mammogram was done in August.   She would like to get a prevnar as well- she is s/p splenectomy due to ITP.  She does not generally have any issues with her BP.  She has never been treated for HTN.  There is a family history of HTN however She is fasting today, she stopped taking her cholesterol and would like to see how she looks without it.  She would prefer to go on lipitor for savings if she needs medication.  She has been off meds for 6 months.    She exercises for 2 hours 3x a week- she does "deep water aerobics."    History of left knee swelling and pain on occasion.  She saw someone at [facility] a few years ago but cannot remember who she saw.  She would like to see them again to discuss her options.    BP Readings from Last 3 Encounters:  02/07/14 142/90  02/01/13 120/76  10/19/12 110/72    Patient Active Problem List   Diagnosis Date Noted  . High cholesterol 02/01/2013  . H/O splenectomy 02/01/2013  . Non-allergic rhinitis 02/01/2013  . Glaucoma 02/01/2013    Past Medical History  Diagnosis Date  . Allergy   . Cataract   . Clotting disorder   . ITP (idiopathic thrombocytopenic purpura)   . Glaucoma     Past Surgical History  Procedure Laterality Date  . Abdominal hysterectomy    . Spleen removal    . Spleen removal       History  Substance Use Topics  . Smoking status: Former Smoker -- 0.50 packs/day    Quit date: 12/03/2013  . Smokeless tobacco: Not on file  . Alcohol Use: No    Family History  Problem Relation Age of Onset  . Cancer Mother   . Hypertension Mother   . Heart disease Mother   . Hypertension Sister   . Hypertension Brother   . Diabetes Brother   . Hypertension Sister   . Hypertension Brother   . Diabetes Brother   . Hypertension Brother   . Diabetes Brother   . Hypertension Brother   . Diabetes Brother   . Heart disease Father     Allergies  Allergen Reactions  . Aspirin     Can not take  . Nsaids     Can not take   . Tylenol [Acetaminophen]     Can not take    Medication list has been reviewed and updated.  Current Outpatient Prescriptions on File Prior to Visit  Medication Sig Dispense Refill  . carboxymethylcellulose (REFRESH PLUS) 0.5 % SOLN Apply 1 drop to eye 3 (three) times daily as needed (for dry eyes).    . montelukast (SINGULAIR) 10  MG tablet Take 10 mg by mouth daily.    . Multiple Vitamin (MULTIVITAMIN WITH MINERALS) TABS tablet Take 1 tablet by mouth daily.    . Olopatadine HCl 0.6 % SOLN Place 1 spray into the nose daily.    Marland Kitchen PRESCRIPTION MEDICATION Zyrtec 10 mg taking daily    . PRESCRIPTION MEDICATION Patonase nasal spray 665 mg taking daily    . PRESCRIPTION MEDICATION Qvar inhaler 40 mcg taking once daily    . QVAR 40 MCG/ACT inhaler Inhale 1 puff into the lungs 2 (two) times daily.    . rosuvastatin (CRESTOR) 10 MG tablet Take 0.5 tablets (5 mg total) by mouth at bedtime. (Patient not taking: Reported on 02/07/2014) 90 tablet 3   No current facility-administered medications on file prior to visit.    Review of Systems:  As per HPI- otherwise negative.   Physical Examination: Filed Vitals:   02/07/14 0848  BP: 170/96  Pulse: 63  Temp: 97.7 F (36.5 C)  Resp: 16   Filed Vitals:   02/07/14 0848  Height: 5\' 5"  (1.651 m)   Weight: 181 lb (82.101 kg)   Body mass index is 30.12 kg/(m^2). Ideal Body Weight: Weight in (lb) to have BMI = 25: 149.9  GEN: WDWN, NAD, Non-toxic, A & O x 3, overweight, looks well HEENT: Atraumatic, Normocephalic. Neck supple. No masses, No LAD.  Bilateral TM wnl, oropharynx normal.  PEERL,EOMI.   Ears and Nose: No external deformity. CV: RRR, No M/G/R. No JVD. No thrill. No extra heart sounds. PULM: CTA B, no wheezes, crackles, rhonchi. No retractions. No resp. distress. No accessory muscle use. ABD: S, NT, ND. No rebound. No HSM. EXTR: No c/c/e NEURO Normal gait.  PSYCH: Normally interactive. Conversant. Not depressed or anxious appearing.  Calm demeanor.  Normal bilateral breast exam; no masses, discharge or dimpling Left knee: minimal crepitus, no effusion, knee is stable   Assessment and Plan: Physical exam  Need for prophylactic vaccination and inoculation against influenza - Plan: Flu Vaccine QUAD 36+ mos IM, Flu Vaccine QUAD 36+ mos IM  H/O splenectomy - Plan: Pneumococcal conjugate vaccine 13-valent IM  Hyperlipidemia - Plan: Lipid panel  Screening for diabetes mellitus - Plan: Comprehensive metabolic panel  Screening for deficiency anemia - Plan: CBC with Differential  Left knee pain - Plan: Ambulatory referral to Orthopedic Surgery  Screening for hypothyroidism - Plan: TSH  Elevated blood pressure  CPE, immunization and labs today as above Referral back to ortho Her BP is generally excellent- [facility] and let me know if continuing to run high.  If so I will start her on a medication- probably lisinopril  Signed Lamar Blinks, MD

## 2014-02-08 ENCOUNTER — Encounter: Payer: Self-pay | Admitting: Family Medicine

## 2014-02-08 DIAGNOSIS — D75839 Thrombocytosis, unspecified: Secondary | ICD-10-CM | POA: Insufficient documentation

## 2014-02-08 DIAGNOSIS — D473 Essential (hemorrhagic) thrombocythemia: Secondary | ICD-10-CM | POA: Insufficient documentation

## 2014-02-08 MED ORDER — ATORVASTATIN CALCIUM 20 MG PO TABS: 20.0000 mg | ORAL_TABLET | Freq: Every day | ORAL | Status: DC

## 2014-02-08 NOTE — Addendum Note (Signed)
Addended by: Lamar Blinks C on: 02/08/2014 02:20 PM   Modules accepted: Orders

## 2014-02-08 NOTE — Progress Notes (Signed)
[facility] [address] [place] [address] [phone]  Date:  02/07/2014   Name:  Sabrina Knight   DOB:  03/29/1950   MRN:  [id number]  PCP:  Simona Huh, MD    Chief Complaint: Annual Exam   History of Present Illness:  Sabrina Knight is a 64 y.o. very pleasant female patient who presents with the following:  Here today for a CPE.  She is s/p hysterectomy for benign causes.   She sees an allergist for seasonal allergies. She is not on allergy shots- she takes singulair and an OTC medication.  She has "non- allergic rhinitis."  Her allergy testing was negative.   She would like to do a flu shot Mammogram was done in August.   She would like to get a prevnar as well- she is s/p splenectomy due to ITP.  She does not generally have any issues with her BP.  She has never been treated for HTN.  There is a family history of HTN however She is fasting today, she stopped taking her cholesterol and would like to see how she looks without it.  She would prefer to go on lipitor for savings if she needs medication.  She has been off meds for 6 months.    She exercises for 2 hours 3x a week- she does "deep water aerobics."    History of left knee swelling and pain on occasion.  She saw someone at [facility] a few years ago but cannot remember who she saw.  She would like to see them again to discuss her options.    BP Readings from Last 3 Encounters:  02/07/14 142/90  02/01/13 120/76  10/19/12 110/72    Patient Active Problem List   Diagnosis Date Noted  . High cholesterol 02/01/2013  . H/O splenectomy 02/01/2013  . Non-allergic rhinitis 02/01/2013  . Glaucoma 02/01/2013    Past Medical History  Diagnosis Date  . Allergy   . Cataract   . Clotting disorder   . ITP (idiopathic thrombocytopenic purpura)   . Glaucoma     Past Surgical History  Procedure Laterality Date  . Abdominal hysterectomy    . Spleen removal    . Spleen removal       History  Substance Use Topics  . Smoking status: Former Smoker -- 0.50 packs/day    Quit date: 12/03/2013  . Smokeless tobacco: Not on file  . Alcohol Use: No    Family History  Problem Relation Age of Onset  . Cancer Mother   . Hypertension Mother   . Heart disease Mother   . Hypertension Sister   . Hypertension Brother   . Diabetes Brother   . Hypertension Sister   . Hypertension Brother   . Diabetes Brother   . Hypertension Brother   . Diabetes Brother   . Hypertension Brother   . Diabetes Brother   . Heart disease Father     Allergies  Allergen Reactions  . Aspirin     Can not take  . Nsaids     Can not take   . Tylenol [Acetaminophen]     Can not take    Medication list has been reviewed and updated.  Current Outpatient Prescriptions on File Prior to Visit  Medication Sig Dispense Refill  . carboxymethylcellulose (REFRESH PLUS) 0.5 % SOLN Apply 1 drop to eye 3 (three) times daily as needed (for dry eyes).    . montelukast (SINGULAIR) 10  MG tablet Take 10 mg by mouth daily.    . Multiple Vitamin (MULTIVITAMIN WITH MINERALS) TABS tablet Take 1 tablet by mouth daily.    . Olopatadine HCl 0.6 % SOLN Place 1 spray into the nose daily.    Marland Kitchen PRESCRIPTION MEDICATION Zyrtec 10 mg taking daily    . PRESCRIPTION MEDICATION Patonase nasal spray 665 mg taking daily    . PRESCRIPTION MEDICATION Qvar inhaler 40 mcg taking once daily    . QVAR 40 MCG/ACT inhaler Inhale 1 puff into the lungs 2 (two) times daily.    . rosuvastatin (CRESTOR) 10 MG tablet Take 0.5 tablets (5 mg total) by mouth at bedtime. (Patient not taking: Reported on 02/07/2014) 90 tablet 3   No current facility-administered medications on file prior to visit.    Review of Systems:  As per HPI- otherwise negative.   Physical Examination: Filed Vitals:   02/07/14 0848  BP: 170/96  Pulse: 63  Temp: 97.7 F (36.5 C)  Resp: 16   Filed Vitals:   02/07/14 0848  Height: 5\' 5"  (1.651 m)   Weight: 181 lb (82.101 kg)   Body mass index is 30.12 kg/(m^2). Ideal Body Weight: Weight in (lb) to have BMI = 25: 149.9  GEN: WDWN, NAD, Non-toxic, A & O x 3, overweight, looks well HEENT: Atraumatic, Normocephalic. Neck supple. No masses, No LAD.  Bilateral TM wnl, oropharynx normal.  PEERL,EOMI.   Ears and Nose: No external deformity. CV: RRR, No M/G/R. No JVD. No thrill. No extra heart sounds. PULM: CTA B, no wheezes, crackles, rhonchi. No retractions. No resp. distress. No accessory muscle use. ABD: S, NT, ND. No rebound. No HSM. EXTR: No c/c/e NEURO Normal gait.  PSYCH: Normally interactive. Conversant. Not depressed or anxious appearing.  Calm demeanor.  Normal bilateral breast exam; no masses, discharge or dimpling Left knee: minimal crepitus, no effusion, knee is stable   Assessment and Plan: Physical exam  Need for prophylactic vaccination and inoculation against influenza - Plan: Flu Vaccine QUAD 36+ mos IM, Flu Vaccine QUAD 36+ mos IM  H/O splenectomy - Plan: Pneumococcal conjugate vaccine 13-valent IM  Hyperlipidemia - Plan: Lipid panel  Screening for diabetes mellitus - Plan: Comprehensive metabolic panel  Screening for deficiency anemia - Plan: CBC with Differential  Left knee pain - Plan: Ambulatory referral to Orthopedic Surgery  Screening for hypothyroidism - Plan: TSH  Elevated blood pressure  CPE, immunization and labs today as above Referral back to ortho Her BP is generally excellent- [facility] and let me know if continuing to run high.  If so I will start her on a medication- probably lisinopril  Signed Lamar Blinks, MD  Received labs 1/19:  Called and LMOM.  Will want to start back on cholesterol medication- will do this for her.  As above she would prefer lipitor.  Letter to pt.  Suspect thrombocytosis due to splenectomy  Results for orders placed or performed in visit on 02/07/14  CBC with  Differential  Result Value Ref Range   WBC 10.3 4.0 - 10.5 K/uL   RBC 4.67 3.87 - 5.11 MIL/uL   Hemoglobin 14.4 12.0 - 15.0 g/dL   HCT 42.4 36.0 - 46.0 %   MCV 90.8 78.0 - 100.0 fL   MCH 30.8 26.0 - 34.0 pg   MCHC 34.0 30.0 - 36.0 g/dL   RDW 13.6 11.5 - 15.5 %   Platelets 518 (H) 150 - 400  K/uL   MPV 10.4 8.6 - 12.4 fL   Neutrophils Relative % 47 43 - 77 %   Neutro Abs 4.8 1.7 - 7.7 K/uL   Lymphocytes Relative 41 12 - 46 %   Lymphs Abs 4.2 (H) 0.7 - 4.0 K/uL   Monocytes Relative 10 3 - 12 %   Monocytes Absolute 1.0 0.1 - 1.0 K/uL   Eosinophils Relative 2 0 - 5 %   Eosinophils Absolute 0.2 0.0 - 0.7 K/uL   Basophils Relative 0 0 - 1 %   Basophils Absolute 0.0 0.0 - 0.1 K/uL   Smear Review Criteria for review not met   Comprehensive metabolic panel  Result Value Ref Range   Sodium 140 135 - 145 mEq/L   Potassium 4.5 3.5 - 5.3 mEq/L   Chloride 105 96 - 112 mEq/L   CO2 25 19 - 32 mEq/L   Glucose, Bld 94 70 - 99 mg/dL   BUN 13 6 - 23 mg/dL   Creat 0.77 0.50 - 1.10 mg/dL   Total Bilirubin 0.5 0.2 - 1.2 mg/dL   Alkaline Phosphatase 90 39 - 117 U/L   AST 21 0 - 37 U/L   ALT 31 0 - 35 U/L   Total Protein 7.1 6.0 - 8.3 g/dL   Albumin 4.1 3.5 - 5.2 g/dL   Calcium 9.8 8.4 - 10.5 mg/dL  Lipid panel  Result Value Ref Range   Cholesterol 300 (H) 0 - 200 mg/dL   Triglycerides 265 (H) <150 mg/dL   HDL 44 >39 mg/dL   Total CHOL/HDL Ratio 6.8 Ratio   VLDL 53 (H) 0 - 40 mg/dL   LDL Cholesterol 203 (H) 0 - 99 mg/dL  TSH  Result Value Ref Range   TSH 1.211 0.350 - 4.500 uIU/mL

## 2014-12-08 DIAGNOSIS — H25013 Cortical age-related cataract, bilateral: Secondary | ICD-10-CM | POA: Diagnosis not present

## 2014-12-08 DIAGNOSIS — H35363 Drusen (degenerative) of macula, bilateral: Secondary | ICD-10-CM | POA: Diagnosis not present

## 2014-12-08 DIAGNOSIS — H2513 Age-related nuclear cataract, bilateral: Secondary | ICD-10-CM | POA: Diagnosis not present

## 2014-12-08 DIAGNOSIS — H40013 Open angle with borderline findings, low risk, bilateral: Secondary | ICD-10-CM | POA: Diagnosis not present

## 2015-01-10 ENCOUNTER — Ambulatory Visit (INDEPENDENT_AMBULATORY_CARE_PROVIDER_SITE_OTHER): Payer: Medicare HMO | Admitting: Family Medicine

## 2015-01-10 VITALS — BP 150/94 | HR 84 | Temp 98.4°F | Resp 18 | Ht 65.0 in | Wt 186.2 lb

## 2015-01-10 DIAGNOSIS — J0110 Acute frontal sinusitis, unspecified: Secondary | ICD-10-CM

## 2015-01-10 MED ORDER — DOXYCYCLINE HYCLATE 100 MG PO TABS: 100.0000 mg | ORAL_TABLET | Freq: Two times a day (BID) | ORAL | Status: DC

## 2015-01-10 NOTE — Patient Instructions (Signed)
We are going to treat you for a sinus infection with doxycycline antibiotic- take this twice a day for 10 days with food and water  Let me know if you are not feeling better in 2-3 days- sooner if you are getting worse!

## 2015-01-10 NOTE — Progress Notes (Signed)
[facility] [address] [place] [address] [phone]  Date:  01/10/2015   Name:  Sabrina Knight   DOB:  1950-08-29   MRN:  [id number]  PCP:  Simona Huh, MD    Chief Complaint: URI   History of Present Illness:  Sabrina Knight is a 64 y.o. very pleasant female patient who presents with the following:  Here today with illness for one wek-  She has been worn out with taking care of her brother who has been ill.  She notes what may be "a cold," but she is concerned that she will not get better.  As she does not have a spleen she sometimes has a harder time getting over illnesses.   Last night her cough got much worse and she started coughing up material.  She is now coughing up some discolored mucus.  She has been sick for 7 days overall.  She first noted a ST, then the cough developed.  She does use allergy medications  The ST soon resolved, but then she developed sinus symptoms and now into her chest.   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  She is trying to rest.  She has not noted a fever History of thrombocytosis s/p splenectomy.    Patient Active Problem List   Diagnosis Date Noted  . Thrombocytosis ([place]) 02/08/2014  . High cholesterol 02/01/2013  . H/O splenectomy 02/01/2013  . Non-allergic rhinitis 02/01/2013  . Glaucoma 02/01/2013    Past Medical History  Diagnosis Date  . Allergy   . Cataract   . Clotting disorder ([place])   . ITP (idiopathic thrombocytopenic purpura)   . Glaucoma     Past Surgical History  Procedure Laterality Date  . Abdominal hysterectomy    . Spleen removal    . Spleen removal      Social History  Substance Use Topics  . Smoking status: Former Smoker -- 0.50 packs/day    Quit date: 12/03/2013  . Smokeless tobacco: None  . Alcohol Use: No    Family History  Problem Relation Age of Onset  . Cancer Mother   . Hypertension Mother   . Heart disease  Mother   . Hyperlipidemia Mother   . Mental illness Mother   . Hypertension Sister   . Hypertension Brother   . Diabetes Brother   . Cancer Brother   . Hypertension Sister   . Hypertension Brother   . Diabetes Brother   . Hypertension Brother   . Diabetes Brother   . Hypertension Brother   . Diabetes Brother   . Heart disease Father     Allergies  Allergen Reactions  . Aspirin     Can not take  . Nsaids     Can not take   . Tylenol [Acetaminophen]     Can not take    Medication list has been reviewed and updated.  Current Outpatient Prescriptions on File Prior to Visit  Medication Sig Dispense Refill  . atorvastatin (LIPITOR) 20 MG tablet Take 1 tablet (20 mg total) by mouth daily. 90 tablet 3  . carboxymethylcellulose (REFRESH PLUS) 0.5 % SOLN Apply 1 drop to eye 3 (three) times daily as needed (for dry eyes).    . montelukast (SINGULAIR) 10 MG tablet Take 10 mg by mouth daily.    . Multiple Vitamin (MULTIVITAMIN WITH MINERALS) TABS tablet Take 1 tablet by mouth  daily.    . Olopatadine HCl 0.6 % SOLN Place 1 spray into the nose daily.    Marland Kitchen PRESCRIPTION MEDICATION Zyrtec 10 mg taking daily    . PRESCRIPTION MEDICATION Patonase nasal spray 665 mg taking daily    . PRESCRIPTION MEDICATION Qvar inhaler 40 mcg taking once daily    . QVAR 40 MCG/ACT inhaler Inhale 1 puff into the lungs 2 (two) times daily.    . rosuvastatin (CRESTOR) 10 MG tablet Take 0.5 tablets (5 mg total) by mouth at bedtime. (Patient not taking: Reported on 01/10/2015) 90 tablet 3   No current facility-administered medications on file prior to visit.    Review of Systems:  As per HPI- otherwise negative. Maybe an occasional chill No GI symptoms.   BP Readings from Last 3 Encounters:  01/10/15 150/94  02/07/14 142/90  02/01/13 120/76      Physical Examination: Filed Vitals:   01/10/15 1133  BP: 150/94  Pulse: 84  Temp: 98.4 F (36.9 C)  Resp: 18   Filed Vitals:   01/10/15 1133   Height: 5\' 5"  (1.651 m)  Weight: 186 lb 3.2 oz (84.46 kg)   Body mass index is 30.99 kg/(m^2). Ideal Body Weight: Weight in (lb) to have BMI = 25: 149.9  GEN: WDWN, NAD, Non-toxic, A & O x 3, overweight, looks well HEENT: Atraumatic, Normocephalic. Neck supple. No masses, No LAD.  Bilateral TM wnl, oropharynx normal.  PEERL,EOMI.  Nasal inflammation.   Ears and Nose: No external deformity. CV: RRR, No M/G/R. No JVD. No thrill. No extra heart sounds. PULM: CTA B, no wheezes, crackles, rhonchi. No retractions. No resp. distress. No accessory muscle use. EXTR: No c/c/e NEURO Normal gait.  PSYCH: Normally interactive. Conversant. Not depressed or anxious appearing.  Calm demeanor.    Assessment and Plan: Acute frontal sinusitis, recurrence not specified - Plan: doxycycline (VIBRA-TABS) 100 MG tablet  Here today with likely sinus infection.  A virus is possible but she would like and abx given her lack of spleen and I think this is reasonable.  Will rx doxycycline for her and she will let me know if not feeling better soon- Sooner if worse.    She has been monitoring her BP at home and it has been normal per her report- currently taking mucinex D which is likely why her BP is a bit high   Signed Lamar Blinks, MD

## 2015-02-06 ENCOUNTER — Telehealth: Payer: Self-pay

## 2015-02-06 NOTE — Telephone Encounter (Signed)
Left voicemail informing patient of appointment change. Will mail letter confirming appointment. Sabrina Knight

## 2015-03-20 ENCOUNTER — Encounter: Payer: BLUE CROSS/BLUE SHIELD | Admitting: Family Medicine

## 2015-03-28 ENCOUNTER — Ambulatory Visit (INDEPENDENT_AMBULATORY_CARE_PROVIDER_SITE_OTHER): Payer: Medicare HMO | Admitting: Family Medicine

## 2015-03-28 ENCOUNTER — Encounter: Payer: Self-pay | Admitting: Family Medicine

## 2015-03-28 VITALS — BP 131/77 | HR 76 | Temp 97.7°F | Resp 16 | Ht 65.5 in | Wt 185.0 lb

## 2015-03-28 DIAGNOSIS — D75839 Thrombocytosis, unspecified: Secondary | ICD-10-CM

## 2015-03-28 DIAGNOSIS — Z9081 Acquired absence of spleen: Secondary | ICD-10-CM | POA: Diagnosis not present

## 2015-03-28 DIAGNOSIS — J310 Chronic rhinitis: Secondary | ICD-10-CM

## 2015-03-28 DIAGNOSIS — Z23 Encounter for immunization: Secondary | ICD-10-CM | POA: Diagnosis not present

## 2015-03-28 DIAGNOSIS — H409 Unspecified glaucoma: Secondary | ICD-10-CM | POA: Diagnosis not present

## 2015-03-28 DIAGNOSIS — Z1159 Encounter for screening for other viral diseases: Secondary | ICD-10-CM

## 2015-03-28 DIAGNOSIS — D473 Essential (hemorrhagic) thrombocythemia: Secondary | ICD-10-CM

## 2015-03-28 DIAGNOSIS — E7800 Pure hypercholesterolemia, unspecified: Secondary | ICD-10-CM | POA: Diagnosis not present

## 2015-03-28 DIAGNOSIS — Z131 Encounter for screening for diabetes mellitus: Secondary | ICD-10-CM

## 2015-03-28 DIAGNOSIS — Z114 Encounter for screening for human immunodeficiency virus [HIV]: Secondary | ICD-10-CM

## 2015-03-28 DIAGNOSIS — Z6830 Body mass index (BMI) 30.0-30.9, adult: Secondary | ICD-10-CM | POA: Diagnosis not present

## 2015-03-28 DIAGNOSIS — R69 Illness, unspecified: Secondary | ICD-10-CM | POA: Diagnosis not present

## 2015-03-28 DIAGNOSIS — Z0000 Encounter for general adult medical examination without abnormal findings: Secondary | ICD-10-CM

## 2015-03-28 DIAGNOSIS — E669 Obesity, unspecified: Secondary | ICD-10-CM | POA: Diagnosis not present

## 2015-03-28 LAB — CBC WITH DIFFERENTIAL/PLATELET
BASOS PCT: 1 % (ref 0–1)
Basophils Absolute: 0.1 10*3/uL (ref 0.0–0.1)
Eosinophils Absolute: 0.2 10*3/uL (ref 0.0–0.7)
Eosinophils Relative: 2 % (ref 0–5)
HCT: 41.4 % (ref 36.0–46.0)
HEMOGLOBIN: 14.1 g/dL (ref 12.0–15.0)
Lymphocytes Relative: 50 % — ABNORMAL HIGH (ref 12–46)
Lymphs Abs: 5.2 10*3/uL — ABNORMAL HIGH (ref 0.7–4.0)
MCH: 30.5 pg (ref 26.0–34.0)
MCHC: 34.1 g/dL (ref 30.0–36.0)
MCV: 89.4 fL (ref 78.0–100.0)
MONO ABS: 1 10*3/uL (ref 0.1–1.0)
MPV: 9.8 fL (ref 8.6–12.4)
Monocytes Relative: 10 % (ref 3–12)
NEUTROS ABS: 3.8 10*3/uL (ref 1.7–7.7)
Neutrophils Relative %: 37 % — ABNORMAL LOW (ref 43–77)
Platelets: 553 10*3/uL — ABNORMAL HIGH (ref 150–400)
RBC: 4.63 MIL/uL (ref 3.87–5.11)
RDW: 13.6 % (ref 11.5–15.5)
WBC: 10.3 10*3/uL (ref 4.0–10.5)

## 2015-03-28 LAB — COMPREHENSIVE METABOLIC PANEL
ALBUMIN: 4.2 g/dL (ref 3.6–5.1)
ALT: 33 U/L — ABNORMAL HIGH (ref 6–29)
AST: 22 U/L (ref 10–35)
Alkaline Phosphatase: 96 U/L (ref 33–130)
BILIRUBIN TOTAL: 0.5 mg/dL (ref 0.2–1.2)
BUN: 11 mg/dL (ref 7–25)
CO2: 24 mmol/L (ref 20–31)
CREATININE: 0.8 mg/dL (ref 0.50–0.99)
Calcium: 9.7 mg/dL (ref 8.6–10.4)
Chloride: 105 mmol/L (ref 98–110)
GLUCOSE: 100 mg/dL — AB (ref 65–99)
Potassium: 4.4 mmol/L (ref 3.5–5.3)
SODIUM: 141 mmol/L (ref 135–146)
Total Protein: 7 g/dL (ref 6.1–8.1)

## 2015-03-28 LAB — HEPATITIS C ANTIBODY: HCV AB: NEGATIVE

## 2015-03-28 LAB — POCT URINALYSIS DIP (MANUAL ENTRY)
Bilirubin, UA: NEGATIVE
Glucose, UA: NEGATIVE
Ketones, POC UA: NEGATIVE
LEUKOCYTES UA: NEGATIVE
NITRITE UA: NEGATIVE
Protein Ur, POC: NEGATIVE
Urobilinogen, UA: 0.2
pH, UA: 5.5

## 2015-03-28 LAB — LIPID PANEL
Cholesterol: 195 mg/dL (ref 125–200)
HDL: 43 mg/dL — ABNORMAL LOW (ref >=46)
LDL Cholesterol: 108 mg/dL (ref <130)
TRIGLYCERIDES: 220 mg/dL — AB (ref <150)
Total CHOL/HDL Ratio: 4.5 Ratio (ref <=5.0)
VLDL: 44 mg/dL — ABNORMAL HIGH (ref <30)

## 2015-03-28 LAB — HIV ANTIBODY (ROUTINE TESTING W REFLEX): HIV 1&2 Ab, 4th Generation: NONREACTIVE

## 2015-03-28 NOTE — Patient Instructions (Addendum)
Because you received labwork today, you will receive an invoice from [employer]. Please contact [facility] at [phone] with questions or concerns regarding your invoice. Our billing staff will not be able to assist you with those questions.  You will be contacted with the lab results as soon as they are available. The fastest way to get your results is to activate your My Chart account. Instructions are located on the last page of this paperwork. If you have not heard from [place] regarding the results in 2 weeks, please contact this office.  Keeping You Healthy  Get These Tests  Blood Pressure- Have your blood pressure checked by your healthcare provider at least once a year.  Normal blood pressure is 120/80.  Weight- Have your body mass index (BMI) calculated to screen for obesity.  BMI is a measure of body fat based on height and weight.  You can calculate your own BMI at [employer].it  Cholesterol- Have your cholesterol checked every year.  Diabetes- Have your blood sugar checked every year if you have high blood pressure, high cholesterol, a family history of diabetes or if you are overweight.  Pap Test - Have a pap test every 1 to 5 years if you have been sexually active.  If you are older than 65 and recent pap tests have been normal you may not need additional pap tests.  In addition, if you have had a hysterectomy  for benign disease additional pap tests are not necessary.  Mammogram-Yearly mammograms are essential for early detection of breast cancer  Screening for Colon Cancer- Colonoscopy starting at age 28. Screening may begin sooner depending on your family history and other health conditions.  Follow up colonoscopy as directed by your Gastroenterologist.  Screening for Osteoporosis- Screening begins at age 77 with bone density scanning, sooner if you are at higher risk for developing Osteoporosis.  Get these medicines  Calcium with Vitamin  D- Your body requires 1200-1500 mg of Calcium a day and [phone] IU of Vitamin D a day.  You can only absorb 500 mg of Calcium at a time therefore Calcium must be taken in 2 or 3 separate doses throughout the day.  Hormones- Hormone therapy has been associated with increased risk for certain cancers and heart disease.  Talk to your healthcare provider about if you need relief from menopausal symptoms.  Aspirin- Ask your healthcare provider about taking Aspirin to prevent Heart Disease and Stroke.  Get these Immuniztions  Flu shot- Every fall  Pneumonia shot- Once after the age of 45; if you are younger ask your healthcare provider if you need a pneumonia shot.  Tetanus- Every ten years.  Zostavax- Once after the age of 33 to prevent shingles.  Take these steps  Don't smoke- Your healthcare provider can help you quit. For tips on how to quit, ask your healthcare provider or go to www.smokefree.gov or call 1-800 QUIT-NOW.  Be physically active- Exercise 5 days a week for a minimum of 30 minutes.  If you are not already physically active, start slow and gradually work up to 30 minutes of moderate physical activity.  Try walking, dancing, bike riding, swimming, etc.  Eat a healthy diet- Eat a variety of healthy foods such as fruits, vegetables, whole grains, low fat milk, low fat cheeses, yogurt, lean meats, chicken, fish, eggs, dried beans, tofu, etc.  For more information go to www.thenutritionsource.org  Dental visit- Brush and floss teeth twice daily; visit your dentist twice a year.  Eye exam- Visit your Optometrist or Ophthalmologist yearly.  Drink alcohol in moderation- Limit alcohol intake to one drink or less a day.  Never drink and drive.  Depression- Your emotional health is as important as your physical health.  If you're feeling down or losing interest in things you normally enjoy, please talk to your healthcare provider.  Seat Belts- can save your life; always wear  one  Smoke/Carbon Monoxide detectors- These detectors need to be installed on the appropriate level of your home.  Replace batteries at least once a year.  Violence- If anyone is threatening or hurting you, please tell your healthcare provider. Living Will/ Health care power of attorney- Discuss with your healthcare provider and family.

## 2015-03-28 NOTE — Progress Notes (Signed)
Subjective:    Patient ID: KAYLEIGH RUCKEL, female    DOB: 05/30/1950, 65 y.o.   MRN: [id number]  03/28/2015  Annual Exam   HPI This 65 y.o. female presents for Complete Physical Examination.  Last physical:  01-2014 Pap smear: hysterectomy 2001 fibroids; bleeding; no abnormal pap smear.  Ovaries resected.   Mammogram:  08-24-2013; just completed 10-2014 [facility] Colonoscopy:  08-01-2011; repeat five years; polyps Bone density:  never TDAP:  02-01-2013 Pneumovax:  02-01-2013; Prevnar 13 2016 Zostavax:  2012 Influenza:  02-07-2014; 03-28-2015 Eye exam:  +glasses; 11/2014; narrow angle glaucoma; +early cataracts Dental exam:  Dr. Clide Cliff; every six months.     Hypercholesterolemia: taking Lipitor four days per week.  Non-allergic rhinitis: followed by Orvil Feil; s/p allergy testing; taking Zyrtec and Singulair.  QVAR sparingly.  ITP s/p splenectomy: 1997.  Review of Systems  Constitutional: Negative for fever, chills, diaphoresis, activity change, appetite change, fatigue and unexpected weight change.  HENT: Negative for congestion, dental problem, drooling, ear discharge, ear pain, facial swelling, hearing loss, mouth sores, nosebleeds, postnasal drip, rhinorrhea, sinus pressure, sneezing, sore throat, tinnitus, trouble swallowing and voice change.   Eyes: Negative for photophobia, pain, discharge, redness, itching and visual disturbance.  Respiratory: Negative for apnea, cough, choking, chest tightness, shortness of breath, wheezing and stridor.   Cardiovascular: Negative for chest pain, palpitations and leg swelling.  Gastrointestinal: Negative for nausea, vomiting, abdominal pain, diarrhea, constipation, blood in stool, abdominal distention, anal bleeding and rectal pain.  Endocrine: Negative for cold intolerance, heat intolerance, polydipsia, polyphagia and polyuria.  Genitourinary: Negative for dysuria, urgency, frequency, hematuria, flank pain, decreased urine volume, vaginal bleeding,  vaginal discharge, enuresis, difficulty urinating, genital sores, vaginal pain, menstrual problem, pelvic pain and dyspareunia.  Musculoskeletal: Negative for myalgias, back pain, joint swelling, arthralgias, gait problem, neck pain and neck stiffness.  Skin: Negative for color change, pallor, rash and wound.  Allergic/Immunologic: Negative for environmental allergies, food allergies and immunocompromised state.  Neurological: Negative for dizziness, tremors, seizures, syncope, facial asymmetry, speech difficulty, weakness, light-headedness, numbness and headaches.  Hematological: Negative for adenopathy. Does not bruise/bleed easily.  Psychiatric/Behavioral: Negative for suicidal ideas, hallucinations, behavioral problems, confusion, sleep disturbance, self-injury, dysphoric mood, decreased concentration and agitation. The patient is not nervous/anxious and is not hyperactive.     Past Medical History  Diagnosis Date  . Cataract   . Clotting disorder ([place])   . ITP (idiopathic thrombocytopenic purpura)   . Glaucoma   . Allergy     Zyrtec, Singulair; allergy testing negative; Orvil Feil.   Past Surgical History  Procedure Laterality Date  . Spleen removal  01/22/1995    for ITP  . Spleen removal    . Abdominal hysterectomy  01/22/1999    ovaries resected; DUB/fibroids   Allergies  Allergen Reactions  . Aspirin     Can not take  . Nsaids     Can not take   . Tylenol [Acetaminophen]     Can not take   Current Outpatient Prescriptions  Medication Sig Dispense Refill  . carboxymethylcellulose (REFRESH PLUS) 0.5 % SOLN Apply 1 drop to eye 3 (three) times daily as needed (for dry eyes).    . montelukast (SINGULAIR) 10 MG tablet Take 10 mg by mouth daily.    . Multiple Vitamin (MULTIVITAMIN WITH MINERALS) TABS tablet Take 1 tablet by mouth daily.    . Olopatadine HCl 0.6 % SOLN Place 1 spray into the nose daily.    Marland Kitchen PRESCRIPTION MEDICATION Zyrtec 10 mg  taking daily    . PRESCRIPTION  MEDICATION Patonase nasal spray 665 mg taking daily    . QVAR 40 MCG/ACT inhaler Inhale 1 puff into the lungs 2 (two) times daily.    Marland Kitchen atorvastatin (LIPITOR) 20 MG tablet Take 1 tablet (20 mg total) by mouth daily. 90 tablet 0   No current facility-administered medications for this visit.   Social History   Social History  . Marital Status: Widowed    Spouse Name: N/A  . Number of Children: N/A  . Years of Education: N/A   Occupational History  . Retired    Social History Main Topics  . Smoking status: Former Smoker -- 0.50 packs/day    Quit date: 12/03/2013  . Smokeless tobacco: Not on file  . Alcohol Use: No  . Drug Use: No  . Sexual Activity: No   Other Topics Concern  . Not on file   Social History Narrative   Marital status:  Widowed x since 2001 from [place]; married x 27 years.  Not dating really in 2017.      Children:  None; failed in vitro.      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      Tobacco: quit ; smoked 20 years.      Alcohol: none      Education: [employer].       Exercise: Yes; 3 times per week 2 hours deep water aerobics.      ADLs: independent ADLs; no assistant devices.      Advanced Directives:  None; FULL CODE; no prolonged measures.     Family History  Problem Relation Age of Onset  . Cancer Mother 50    lung cancer  . Hypertension Mother   . Hyperlipidemia Mother   . Mental illness Mother   . Hypertension Sister   . Hypertension Brother   . Diabetes Brother   . Cancer Brother 49    bladder cancer; non-smoker  . Heart disease Brother 59    CABG/CAD  . Hypertension Sister   . Diabetes Sister   . Hypertension Brother   . Diabetes Brother   . Hypertension Brother   . Diabetes Brother   . Hypertension Brother   . COPD Father        Objective:    BP 131/77 mmHg  Pulse 76  Temp(Src) 97.7 F (36.5 C)  Resp 16  Ht 5' 5.5" (1.664 m)  Wt 185 lb (83.915 kg)  BMI 30.31 kg/m2 Physical  Exam  Constitutional: She is oriented to person, place, and time. She appears well-developed and well-nourished. No distress.  HENT:  Head: Normocephalic and atraumatic.  Right Ear: External ear normal.  Left Ear: External ear normal.  Nose: Nose normal.  Mouth/Throat: Oropharynx is clear and moist.  Eyes: Conjunctivae and EOM are normal. Pupils are equal, round, and reactive to light.  Neck: Normal range of motion and full passive range of motion without pain. Neck supple. No JVD present. Carotid bruit is not present. No thyromegaly present.  Cardiovascular: Normal rate, regular rhythm and normal heart sounds.  Exam reveals no gallop and no friction rub.   No murmur heard. Pulmonary/Chest: Effort normal and breath sounds normal. She has no wheezes. She has no rales.  Abdominal: Soft. Bowel sounds are normal. She exhibits no distension and no mass. There is no tenderness. There is no rebound and no guarding.  Musculoskeletal:  Right shoulder: Normal.       Left shoulder: Normal.       Cervical back: Normal.  Lymphadenopathy:    She has no cervical adenopathy.  Neurological: She is alert and oriented to person, place, and time. She has normal reflexes. No cranial nerve deficit. She exhibits normal muscle tone. Coordination normal.  Skin: Skin is warm and dry. No rash noted. She is not diaphoretic. No erythema. No pallor.  Psychiatric: She has a normal mood and affect. Her behavior is normal. Judgment and thought content normal.  Nursing note and vitals reviewed.  Fall Risk  03/28/2015  Falls in the past year? No   Depression screen [facility] 2/9 03/28/2015 01/10/2015  Decreased Interest 0 0  Down, Depressed, Hopeless 0 0  PHQ - 2 Score 0 0        Assessment & Plan:   1. Welcome to Medicare preventive visit   2. Routine physical examination   3. High cholesterol   4. H/O splenectomy   5. Glaucoma   6. Thrombocytosis ([place])   7. Non-allergic rhinitis   8. Screening for HIV (human  immunodeficiency virus)   9. Need for hepatitis C screening test   10. Screening for diabetes mellitus   11. Obesity   12. BMI 30.0-30.9,adult     Orders Placed This Encounter  Procedures  . Flu Vaccine QUAD 36+ mos IM  . CBC with Differential/Platelet  . Comprehensive metabolic panel    Order Specific Question:  Has the patient fasted?    Answer:  Yes  . Lipid panel    Order Specific Question:  Has the patient fasted?    Answer:  Yes  . HIV antibody  . Hepatitis C antibody  . POCT urinalysis dipstick  . EKG 12-Lead   No orders of the defined types were placed in this encounter.    Return in about 6 months (around 09/28/2015) for recheck high cholesterol.    Kristi Elayne Guerin, M.D. [facility] [address] [place], [place]  [address] [phone] phone [phone] fax

## 2015-04-12 DIAGNOSIS — H01003 Unspecified blepharitis right eye, unspecified eyelid: Secondary | ICD-10-CM | POA: Diagnosis not present

## 2015-04-12 DIAGNOSIS — H00024 Hordeolum internum left upper eyelid: Secondary | ICD-10-CM | POA: Diagnosis not present

## 2015-04-20 ENCOUNTER — Telehealth: Payer: Self-pay

## 2015-04-20 DIAGNOSIS — E785 Hyperlipidemia, unspecified: Secondary | ICD-10-CM

## 2015-04-20 NOTE — Telephone Encounter (Signed)
Medication   atorvastatin (LIPITOR) 20 MG tablet [[id number]]       atorvastatin (LIPITOR) 20 MG tablet [id number]     Order Details    Dose: 20 mg Route: Oral Frequency: Daily   Dispense Quantity:  90 tablet Refills:  3 Fills Remaining:  3          Sig: Take 1 tablet (20 mg total) by mouth daily.         Written Date:  02/08/14 Expiration Date:  02/08/15     Start Date:  02/08/14 End Date:  --     Ordering Provider:  -- Authorizing Provider:  Darreld Mclean, MD Ordering User:  Darreld Mclean, MD          Diagnosis Association: Hyperlipidemia (272.4)             Pharmacy:  [id number] - [place], [place]        Call back [phone]

## 2015-04-21 MED ORDER — ATORVASTATIN CALCIUM 20 MG PO TABS: 20.0000 mg | ORAL_TABLET | Freq: Every day | ORAL | Status: DC

## 2015-04-21 NOTE — Telephone Encounter (Signed)
Rx sent in

## 2015-09-28 DIAGNOSIS — J4530 Mild persistent asthma, uncomplicated: Secondary | ICD-10-CM | POA: Diagnosis not present

## 2015-09-28 DIAGNOSIS — J300 Vasomotor rhinitis: Secondary | ICD-10-CM | POA: Diagnosis not present

## 2015-10-03 ENCOUNTER — Ambulatory Visit (INDEPENDENT_AMBULATORY_CARE_PROVIDER_SITE_OTHER): Payer: Medicare HMO | Admitting: Family Medicine

## 2015-10-03 ENCOUNTER — Encounter: Payer: Self-pay | Admitting: Family Medicine

## 2015-10-03 VITALS — BP 120/60 | HR 80 | Temp 97.6°F | Resp 18 | Ht 65.5 in | Wt 186.0 lb

## 2015-10-03 DIAGNOSIS — J310 Chronic rhinitis: Secondary | ICD-10-CM

## 2015-10-03 DIAGNOSIS — E2839 Other primary ovarian failure: Secondary | ICD-10-CM

## 2015-10-03 DIAGNOSIS — D473 Essential (hemorrhagic) thrombocythemia: Secondary | ICD-10-CM | POA: Diagnosis not present

## 2015-10-03 DIAGNOSIS — Z23 Encounter for immunization: Secondary | ICD-10-CM | POA: Diagnosis not present

## 2015-10-03 DIAGNOSIS — H401132 Primary open-angle glaucoma, bilateral, moderate stage: Secondary | ICD-10-CM | POA: Diagnosis not present

## 2015-10-03 DIAGNOSIS — E7800 Pure hypercholesterolemia, unspecified: Secondary | ICD-10-CM

## 2015-10-03 DIAGNOSIS — D75839 Thrombocytosis, unspecified: Secondary | ICD-10-CM

## 2015-10-03 DIAGNOSIS — Z9081 Acquired absence of spleen: Secondary | ICD-10-CM

## 2015-10-03 DIAGNOSIS — R945 Abnormal results of liver function studies: Secondary | ICD-10-CM

## 2015-10-03 DIAGNOSIS — R7989 Other specified abnormal findings of blood chemistry: Secondary | ICD-10-CM | POA: Diagnosis not present

## 2015-10-03 LAB — CBC WITH DIFFERENTIAL/PLATELET
BASOS ABS: 88 {cells}/uL (ref 0–200)
Basophils Relative: 1 %
EOS ABS: 176 {cells}/uL (ref 15–500)
Eosinophils Relative: 2 %
HCT: 43.4 % (ref 35.0–45.0)
HEMOGLOBIN: 14.5 g/dL (ref 11.7–15.5)
LYMPHS ABS: 4400 {cells}/uL — AB (ref 850–3900)
Lymphocytes Relative: 50 %
MCH: 30.4 pg (ref 27.0–33.0)
MCHC: 33.4 g/dL (ref 32.0–36.0)
MCV: 91 fL (ref 80.0–100.0)
MONO ABS: 880 {cells}/uL (ref 200–950)
MPV: 9.9 fL (ref 7.5–12.5)
Monocytes Relative: 10 %
NEUTROS PCT: 37 %
Neutro Abs: 3256 cells/uL (ref 1500–7800)
Platelets: 580 10*3/uL — ABNORMAL HIGH (ref 140–400)
RBC: 4.77 MIL/uL (ref 3.80–5.10)
RDW: 13.6 % (ref 11.0–15.0)
WBC: 8.8 10*3/uL (ref 3.8–10.8)

## 2015-10-03 LAB — COMPREHENSIVE METABOLIC PANEL
ALBUMIN: 4.1 g/dL (ref 3.6–5.1)
ALK PHOS: 83 U/L (ref 33–130)
ALT: 25 U/L (ref 6–29)
AST: 19 U/L (ref 10–35)
BILIRUBIN TOTAL: 0.3 mg/dL (ref 0.2–1.2)
BUN: 13 mg/dL (ref 7–25)
CALCIUM: 9.5 mg/dL (ref 8.6–10.4)
CO2: 24 mmol/L (ref 20–31)
Chloride: 107 mmol/L (ref 98–110)
Creat: 0.67 mg/dL (ref 0.50–0.99)
Glucose, Bld: 91 mg/dL (ref 65–99)
Potassium: 4.6 mmol/L (ref 3.5–5.3)
Sodium: 142 mmol/L (ref 135–146)
Total Protein: 7.2 g/dL (ref 6.1–8.1)

## 2015-10-03 LAB — LIPID PANEL
CHOLESTEROL: 268 mg/dL — AB (ref 125–200)
HDL: 52 mg/dL (ref >=46)
LDL Cholesterol: 165 mg/dL — ABNORMAL HIGH (ref <130)
TRIGLYCERIDES: 254 mg/dL — AB (ref <150)
Total CHOL/HDL Ratio: 5.2 Ratio — ABNORMAL HIGH (ref <=5.0)
VLDL: 51 mg/dL — ABNORMAL HIGH (ref <30)

## 2015-10-03 MED ORDER — PRAVASTATIN SODIUM 20 MG PO TABS: 20.0000 mg | ORAL_TABLET | Freq: Every day | ORAL | 1 refills | Status: DC

## 2015-10-03 NOTE — Progress Notes (Signed)
Patient ID: Sabrina Knight, female   DOB: 1950-05-20, 65 y.o.   MRN: [id number]   By signing my name below I, Tereasa Coop, attest that this documentation has been prepared under the direction and in the presence of Wardell Honour MD. Electonically Signed. Tereasa Coop, Scribe 10/03/2015 at 11:20 AM   Subjective:    Patient ID: Sabrina Knight, female    DOB: Mar 21, 1950, 65 y.o.   MRN: [id number]  10/03/2015  Follow-up and Flu Vaccine   HPI Sabrina Knight is a 65 y.o. female who presents to the [facility] for follow up reevaluation of her HLD. Pt states that she took her HLD medication for 3 months, then discontinued them because they were causing muscle cramping. Pt has taken crestor and lipitor in the past. Pt has not taken pravastatin.  Pt has not used her Qvar in a year. Pt last saw her allergist a week ago and stated the pt was doing great. Pt takes zyrtec in the morning and Singulair at night.   Pt denies any palpitations, CP, leg swelling, cough, or SOB. Pt's BMs has been normal.  Pt works out 3 times a week in the pool.   Wt Readings from Last 3 Encounters:  10/03/15 186 lb (84.4 kg)  03/28/15 185 lb (83.9 kg)  01/10/15 186 lb 3.2 oz (84.5 kg)   BP Readings from Last 3 Encounters:  10/03/15 120/60  03/28/15 131/77  01/10/15 (!) 150/94    Review of Systems  Constitutional: Negative for chills, diaphoresis, fatigue and fever.  HENT: Negative for congestion, rhinorrhea, sinus pressure and sore throat.   Eyes: Negative for visual disturbance.  Respiratory: Negative for apnea, cough, choking, chest tightness, shortness of breath, wheezing and stridor.   Cardiovascular: Negative for chest pain, palpitations and leg swelling.  Gastrointestinal: Negative for abdominal distention, abdominal pain, anal bleeding, blood in stool, constipation, diarrhea, nausea, rectal pain and vomiting.  Endocrine: Negative for cold intolerance, heat intolerance, polydipsia,  polyphagia and polyuria.  Musculoskeletal: Positive for myalgias (muscle cramping, resolved after discontinuing atorvastatin).  Neurological: Negative for dizziness, tremors, seizures, syncope, facial asymmetry, speech difficulty, weakness, light-headedness, numbness and headaches.    Past Medical History:  Diagnosis Date  . Allergy    Zyrtec, Singulair; allergy testing negative; Orvil Feil.  . Cataract   . Clotting disorder ([place])   . Glaucoma   . ITP (idiopathic thrombocytopenic purpura)    Past Surgical History:  Procedure Laterality Date  . ABDOMINAL HYSTERECTOMY  01/22/1999   ovaries resected; DUB/fibroids  . 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 spleen removal     No Active Allergies  Social History   Social History  . Marital status: Widowed    Spouse name: N/A  . Number of children: N/A  . Years of education: N/A   Occupational History  . Retired    Social History Main Topics  . Smoking status: Former Smoker    Packs/day: 0.50    Quit date: 12/03/2013  . Smokeless tobacco: Never Used  . Alcohol use No  . Drug use: No  . Sexual activity: No   Other Topics Concern  . Not on file   Social History Narrative   Marital status:  Widowed x since 2001 from [place]; married x 27 years.  Not dating really in 2017.      Children:  None; failed in vitro.      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      Tobacco: quit ; smoked 20 years.      Alcohol: none      Education: [employer].       Exercise: Yes; 3 times per week 2 hours deep water aerobics.      ADLs: independent ADLs; no assistant devices.      Advanced Directives:  None; FULL CODE; no prolonged measures.     Family History  Problem Relation Age of Onset  . Cancer Mother 14    lung cancer  . Hypertension Mother   . Hyperlipidemia Mother   . Mental illness Mother   . Hypertension Sister   . Hypertension Brother   . Diabetes Brother   . Cancer Brother 45     bladder cancer; non-smoker  . Heart disease Brother 49    CABG/CAD  . Hypertension Sister   . Diabetes Sister   . Hypertension Brother   . Diabetes Brother   . Hypertension Brother   . Diabetes Brother   . Hypertension Brother   . COPD Father        Objective:    BP 120/60 (BP Location: Left Arm, Patient Position: Sitting)   Pulse 80   Temp 97.6 F (36.4 C) (Oral)   Resp 18   Ht 5' 5.5" (1.664 m)   Wt 186 lb (84.4 kg)   SpO2 96%   BMI 30.48 kg/m  Physical Exam  Constitutional: She is oriented to person, place, and time. She appears well-developed and well-nourished. No distress.  HENT:  Head: Normocephalic and atraumatic.  Right Ear: External ear normal.  Left Ear: External ear normal.  Nose: Nose normal.  Mouth/Throat: Oropharynx is clear and moist.  Eyes: Conjunctivae and EOM are normal. Pupils are equal, round, and reactive to light.  Neck: Normal range of motion. Neck supple. Carotid bruit is not present. No thyromegaly present.  Cardiovascular: Normal rate, regular rhythm, normal heart sounds and intact distal pulses.  Exam reveals no gallop and no friction rub.   No murmur heard. Pulmonary/Chest: Effort normal and breath sounds normal. She has no wheezes. She has no rales.  Abdominal: Soft. Bowel sounds are normal. She exhibits no distension and no mass. There is no tenderness. There is no rebound and no guarding.  Lymphadenopathy:    She has no cervical adenopathy.  Neurological: She is alert and oriented to person, place, and time. No cranial nerve deficit.  Skin: Skin is warm and dry. No rash noted. She is not diaphoretic. No erythema. No pallor.  Psychiatric: She has a normal mood and affect. Her behavior is normal.   Note During physical exam, pt's BP was 120/60.        Assessment & Plan:   1. High cholesterol   2. H/O splenectomy   3. Glaucoma   4. Thrombocytosis ([place])   5. Non-allergic rhinitis   6. Elevated LFTs   7. Need for prophylactic  vaccination and inoculation against influenza   8. Estrogen deficiency    -uncontrolled hypercholesterolemia due to intolerance of statin; rx for Pravastatin provided. -refer for bone density. -obtain labs.  Orders Placed This Encounter  Procedures  . DG Bone Density    Standing Status:   Future    Standing Expiration Date:   12/02/2016    Scheduling Instructions:     [facility]    Order Specific Question:   Reason for Exam (SYMPTOM  OR DIAGNOSIS REQUIRED)    Answer:  estrogen deficiency    Order Specific Question:   Preferred imaging location?    Answer:   External  . Flu Vaccine QUAD 36+ mos IM  . Comprehensive metabolic panel    Order Specific Question:   Has the patient fasted?    Answer:   Yes  . Lipid panel    Order Specific Question:   Has the patient fasted?    Answer:   Yes  . CBC with Differential/Platelet   Meds ordered this encounter  Medications  . pravastatin (PRAVACHOL) 20 MG tablet    Sig: Take 1 tablet (20 mg total) by mouth daily.    Dispense:  90 tablet    Refill:  1    Return in about 6 months (around 04/01/2016) for complete physical examiniation.   I personally performed the services described in this documentation, which was scribed in my presence. The recorded information has been reviewed and considered.  Shakala Marlatt Elayne Guerin, M.D. [facility] [address] [place], [place]  [address] [phone] phone [phone] fax

## 2015-10-03 NOTE — Patient Instructions (Addendum)
IF you received an x-ray today, you will receive an invoice from [facility]. Please contact [facility] at [phone] with questions or concerns regarding your invoice.   IF you received labwork today, you will receive an invoice from [employer]. Please contact [facility] at [phone] with questions or concerns regarding your invoice.   Our billing staff will not be able to assist you with questions regarding bills from these companies.  You will be contacted with the lab results as soon as they are available. The fastest way to get your results is to activate your My Chart account. Instructions are located on the last page of this paperwork. If you have not heard from [place] regarding the results in 2 weeks, please contact this office.     High Cholesterol High cholesterol refers to having a high level of cholesterol in your blood. Cholesterol is a white, waxy, fat-like protein that your body needs in small amounts. Your liver makes all the cholesterol you need. Excess cholesterol comes from the food you eat. Cholesterol travels in your bloodstream through your blood vessels. If you have high cholesterol, deposits (plaque) may build up on the walls of your blood vessels. This makes the arteries narrower and stiffer. Plaque increases your risk of heart attack and stroke. Work with your health care provider to keep your cholesterol levels in a healthy range. RISK FACTORS Several things can make you more likely to have high cholesterol. These include:   Eating foods high in animal fat (saturated fat) or cholesterol.  Being overweight.  Not getting enough exercise.  Having a family history of high cholesterol. SIGNS AND SYMPTOMS High cholesterol does not cause symptoms. DIAGNOSIS  Your health care provider can do a blood test to check whether you have high cholesterol. If you are older than 20, your health care provider may check your  cholesterol every 4-6 years. You may be checked more often if you already have high cholesterol or other risk factors for heart disease. The blood test for cholesterol measures the following:  Bad cholesterol (LDL cholesterol). This is the type of cholesterol that causes heart disease. This number should be less than 100.  Good cholesterol (HDL cholesterol). This type helps protect against heart disease. A healthy level of HDL cholesterol is 60 or higher.  Total cholesterol. This is the combined number of LDL cholesterol and HDL cholesterol. A healthy number is less than 200. TREATMENT  High cholesterol can be treated with diet changes, lifestyle changes, and medicine.   Diet changes may include eating more whole grains, fruits, vegetables, nuts, and fish. You may also have to cut back on red meat and foods with a lot of added sugar.  Lifestyle changes may include getting at least 40 minutes of aerobic exercise three times a week. Aerobic exercises include walking, biking, and swimming. Aerobic exercise along with a healthy diet can help you maintain a healthy weight. Lifestyle changes may also include quitting smoking.  If diet and lifestyle changes are not enough to lower your cholesterol, your health care provider may prescribe a statin medicine. This medicine has been shown to lower cholesterol and also lower the risk of heart disease. HOME CARE INSTRUCTIONS  Only take over-the-counter or prescription medicines as directed by your health care provider.   Follow a healthy diet as directed by your health care provider. For instance:   Eat chicken (without skin), fish, veal, shellfish, ground [place] breast, and round or loin cuts of  red meat.  Do not eat fried foods and fatty meats, such as hot dogs and salami.   Eat plenty of fruits, such as apples.   Eat plenty of vegetables, such as broccoli, potatoes, and carrots.   Eat beans, peas, and lentils.   Eat grains, such as barley,  rice, couscous, and bulgur wheat.   Eat pasta without cream sauces.   Use skim or nonfat milk and low-fat or nonfat yogurt and cheeses. Do not eat or drink whole milk, cream, ice cream, egg yolks, and hard cheeses.   Do not eat stick margarine or tub margarines that contain trans fats (also called partially hydrogenated oils).   Do not eat cakes, cookies, crackers, or other baked goods that contain trans fats.   Do not eat saturated tropical oils, such as coconut and palm oil.   Exercise as directed by your health care provider. Increase your activity level with activities such as gardening or walking.   Keep all follow-up appointments.  SEEK MEDICAL CARE IF:  You are struggling to maintain a healthy diet or weight.  You need help starting an exercise program.  You need help to stop smoking. SEEK IMMEDIATE MEDICAL CARE IF:  You have chest pain.  You have trouble breathing.   This information is not intended to replace advice given to you by your health care provider. Make sure you discuss any questions you have with your health care provider.   Document Released: 01/07/2005 Document Revised: 01/28/2014 Document Reviewed: 10/30/2012 Elsevier Interactive Patient Education [employer].

## 2015-10-16 DIAGNOSIS — M8589 Other specified disorders of bone density and structure, multiple sites: Secondary | ICD-10-CM | POA: Diagnosis not present

## 2015-10-16 DIAGNOSIS — Z780 Asymptomatic menopausal state: Secondary | ICD-10-CM | POA: Diagnosis not present

## 2015-10-16 DIAGNOSIS — Z1231 Encounter for screening mammogram for malignant neoplasm of breast: Secondary | ICD-10-CM | POA: Diagnosis not present

## 2015-10-16 LAB — HM MAMMOGRAPHY

## 2015-11-22 ENCOUNTER — Encounter: Payer: Self-pay | Admitting: Family Medicine

## 2015-12-21 DIAGNOSIS — H2513 Age-related nuclear cataract, bilateral: Secondary | ICD-10-CM | POA: Diagnosis not present

## 2015-12-21 DIAGNOSIS — H35363 Drusen (degenerative) of macula, bilateral: Secondary | ICD-10-CM | POA: Diagnosis not present

## 2015-12-21 DIAGNOSIS — H40033 Anatomical narrow angle, bilateral: Secondary | ICD-10-CM | POA: Diagnosis not present

## 2015-12-21 DIAGNOSIS — H40013 Open angle with borderline findings, low risk, bilateral: Secondary | ICD-10-CM | POA: Diagnosis not present

## 2016-03-26 ENCOUNTER — Ambulatory Visit (INDEPENDENT_AMBULATORY_CARE_PROVIDER_SITE_OTHER): Payer: Medicare HMO | Admitting: Physician Assistant

## 2016-03-26 VITALS — BP 130/82 | HR 90 | Temp 98.6°F | Resp 18 | Ht 65.5 in | Wt 192.2 lb

## 2016-03-26 DIAGNOSIS — J22 Unspecified acute lower respiratory infection: Secondary | ICD-10-CM | POA: Diagnosis not present

## 2016-03-26 DIAGNOSIS — J9801 Acute bronchospasm: Secondary | ICD-10-CM

## 2016-03-26 MED ORDER — AZITHROMYCIN 250 MG PO TABS: ORAL_TABLET | ORAL | 0 refills | Status: DC

## 2016-03-26 MED ORDER — BENZONATATE 100 MG PO CAPS: 100.0000 mg | ORAL_CAPSULE | Freq: Three times a day (TID) | ORAL | 0 refills | Status: DC | PRN

## 2016-03-26 MED ORDER — HYDROCOD POLST-CPM POLST ER 10-8 MG/5ML PO SUER: 5.0000 mL | Freq: Every evening | ORAL | 0 refills | Status: DC | PRN

## 2016-03-26 MED ORDER — BECLOMETHASONE DIPROPIONATE 40 MCG/ACT IN AERS: 1.0000 | INHALATION_SPRAY | Freq: Two times a day (BID) | RESPIRATORY_TRACT | 0 refills | Status: DC

## 2016-03-26 NOTE — Patient Instructions (Addendum)
Make sure you continue to hydrate You may use a humidifier to help with your hoarseness. Take medication as prescribed. Please let us know if your symptoms do not improve.    Bronchospasm, Adult Bronchospasm is when airways in the lungs get smaller. When this happens, it can be hard to breathe. You may cough. You may also make a whistling sound when you breathe (wheeze). Follow these instructions at home: Medicines   Take over-the-counter and prescription medicines only as told by your doctor.  If you need to use an inhaler or nebulizer to take your medicine, ask your doctor how to use it.  If you were given a spacer, always use it with your inhaler. Lifestyle   Change your heating and air conditioning filter. Do this at least once a month.  Try not to use fireplaces and wood stoves.  Do not  smoke. Do not  allow smoking in your home.  Try not to use things that have a strong smell, like perfume.  Get rid of pests (such as roaches and mice) and their poop.  Remove any mold from your home.  Keep your house clean. Get rid of dust.  Use cleaning products that have no smell.  Replace carpet with wood, tile, or vinyl flooring.  Use allergy-proof pillows, mattress covers, and box spring covers.  Wash bed sheets and blankets every week. Use hot water. Dry them in a dryer.  Use blankets that are made of polyester or cotton.  Wash your hands often.  Keep pets out of your bedroom.  When you exercise, try not to breathe in cold air. General instructions   Have a plan for getting medical care. Know these things:  When to call your doctor.  When to call local emergency services (911 in the [place]  Where to go in an emergency.  Stay up to date on your shots (immunizations).  When you have an episode:  Stay calm.  Relax.  Breathe slowly. Contact a doctor if:  Your muscles ache.  Your chest hurts.  The color of the mucus you cough up (sputum) changes from clear  or white to yellow, green, gray, or bloody.  The mucus you cough up gets thicker.  You have a fever. Get help right away if:  The whistling sound gets worse, even after you take your medicines.  Your coughing gets worse.  You find it even harder to breathe.  Your chest hurts very much. Summary  Bronchospasm is when airways in the lungs get smaller.  When this happens, it can be hard to breathe. You may cough. You may also make a whistling sound when you breathe.  Stay away from things that cause you to have episodes. These include smoke or dust. This information is not intended to replace advice given to you by your health care provider. Make sure you discuss any questions you have with your health care provider. Document Released: 11/04/2008 Document Revised: 01/11/2016 Document Reviewed: 01/11/2016 Elsevier Interactive Patient Education  2017 [employer].     IF you received an x-ray today, you will receive an invoice from [facility]. Please contact [facility] at [phone] with questions or concerns regarding your invoice.   IF you received labwork today, you will receive an invoice from [facility]. Please contact [facility] at [phone] with questions or concerns regarding your invoice.   Our billing staff will not be able to assist you with questions regarding bills from these companies.  You will be contacted with the  lab results as soon as they are available. The fastest way to get your results is to activate your My Chart account. Instructions are located on the last page of this paperwork. If you have not heard from [place] regarding the results in 2 weeks, please contact this office.

## 2016-03-28 ENCOUNTER — Telehealth: Payer: Self-pay | Admitting: Physician Assistant

## 2016-03-28 DIAGNOSIS — J22 Unspecified acute lower respiratory infection: Secondary | ICD-10-CM

## 2016-03-28 DIAGNOSIS — J9801 Acute bronchospasm: Secondary | ICD-10-CM

## 2016-03-28 MED ORDER — BECLOMETHASONE DIPROPIONATE 40 MCG/ACT IN AERS: 1.0000 | INHALATION_SPRAY | Freq: Two times a day (BID) | RESPIRATORY_TRACT | 0 refills | Status: DC

## 2016-03-28 NOTE — Telephone Encounter (Signed)
Pt was prescribed QVAR inhaler but [facility] pharmacy says it is no longer made. Pharmacist said it is now the [employer]. Pharmacy is requesting a new prescription written for that. Pharmacy callback number is [phone].

## 2016-04-02 ENCOUNTER — Encounter: Payer: Medicare HMO | Admitting: Family Medicine

## 2016-04-05 ENCOUNTER — Telehealth: Payer: Self-pay | Admitting: Family Medicine

## 2016-04-05 NOTE — Progress Notes (Signed)
PRIMARY CARE AT [facility] [address], [place] [address] [phone]  Date:  03/26/2016   Name:  Sabrina Knight   DOB:  [birth year]   MRN:  [id number]  PCP:  Reginia Forts, MD    History of Present Illness:  Sabrina Knight is a 66 y.o. female patient who presents to PCP with  Chief Complaint  Patient presents with  . Sinusitis    x 10 days   . Laryngitis  . Cough    --sinus pressure for 10 days --productive cough --no fever --feels hoarseness --no sob or dyspnea --he does feel winded with doing daily activities.   Patient Active Problem List   Diagnosis Date Noted  . Thrombocytosis ([place]) 02/08/2014  . High cholesterol 02/01/2013  . H/O splenectomy 02/01/2013  . Non-allergic rhinitis 02/01/2013  . Glaucoma 02/01/2013    Past Medical History:  Diagnosis Date  . Allergy    Zyrtec, Singulair; allergy testing negative; Orvil Feil.  . Cataract   . Clotting disorder ([place])   . Glaucoma   . ITP (idiopathic thrombocytopenic purpura)     Past Surgical History:  Procedure Laterality Date  . ABDOMINAL HYSTERECTOMY  01/22/1999   ovaries resected; DUB/fibroids  . 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 spleen removal      Social History  Substance Use Topics  . Smoking status: Former Smoker    Packs/day: 0.50    Quit date: 12/03/2013  . Smokeless tobacco: Never Used  . Alcohol use No    Family History  Problem Relation Age of Onset  . Cancer Mother 21    lung cancer  . Hypertension Mother   . Hyperlipidemia Mother   . Mental illness Mother   . Hypertension Sister   . Hypertension Brother   . Diabetes Brother   . Cancer Brother 64    bladder cancer; non-smoker  . Heart disease Brother 37    CABG/CAD  . Hypertension Sister   . Diabetes Sister   . Hypertension Brother   . Diabetes Brother   . Hypertension Brother   . Diabetes Brother   . Hypertension Brother   . COPD Father     No Known Allergies  Medication list has been reviewed and  updated.  Current Outpatient Prescriptions on File Prior to Visit  Medication Sig Dispense Refill  . montelukast (SINGULAIR) 10 MG tablet Take 10 mg by mouth daily.    . Multiple Vitamin (MULTIVITAMIN WITH MINERALS) TABS tablet Take 1 tablet by mouth daily.    . Olopatadine HCl 0.6 % SOLN Place 1 spray into the nose daily.    Marland Kitchen PRESCRIPTION MEDICATION Zyrtec 10 mg taking daily    . PRESCRIPTION MEDICATION Patonase nasal spray 665 mg taking daily    . atorvastatin (LIPITOR) 20 MG tablet Take 1 tablet (20 mg total) by mouth daily. (Patient not taking: Reported on 10/03/2015) 90 tablet 0  . carboxymethylcellulose (REFRESH PLUS) 0.5 % SOLN Apply 1 drop to eye 3 (three) times daily as needed (for dry eyes).    . pravastatin (PRAVACHOL) 20 MG tablet Take 1 tablet (20 mg total) by mouth daily. (Patient not taking: Reported on 03/26/2016) 90 tablet 1   No current facility-administered medications on file prior to visit.     ROS ROS otherwise unremarkable unless listed above.  Physical Examination: BP 130/82 (BP Location: Right Arm, Patient Position: Sitting, Cuff Size: Small)   Pulse 90   Temp 98.6 F (37 C) (Oral)   Resp  18   Ht 5' 5.5" (1.664 m)   Wt 192 lb 3.2 oz (87.2 kg)   SpO2 94%   BMI 31.50 kg/m  Ideal Body Weight: Weight in (lb) to have BMI = 25: 152.2  Physical Exam  Constitutional: She is oriented to person, place, and time. She appears well-developed and well-nourished. No distress.  HENT:  Head: Normocephalic and atraumatic.  Right Ear: Tympanic membrane, external ear and ear canal normal.  Left Ear: Tympanic membrane, external ear and ear canal normal.  Nose: Mucosal edema and rhinorrhea present. Right sinus exhibits maxillary sinus tenderness. Right sinus exhibits no frontal sinus tenderness. Left sinus exhibits maxillary sinus tenderness. Left sinus exhibits no frontal sinus tenderness.  Mouth/Throat: No uvula swelling. No oropharyngeal exudate, posterior oropharyngeal  edema or posterior oropharyngeal erythema.  Eyes: Conjunctivae and EOM are normal. Pupils are equal, round, and reactive to light.  Cardiovascular: Normal rate and regular rhythm.  Exam reveals no gallop, no distant heart sounds and no friction rub.   No murmur heard. Pulmonary/Chest: Effort normal. No respiratory distress. She has no decreased breath sounds. She has no wheezes. She has rhonchi.  Lymphadenopathy:       Head (right side): No submandibular, no tonsillar, no preauricular and no posterior auricular adenopathy present.       Head (left side): No submandibular, no tonsillar, no preauricular and no posterior auricular adenopathy present.  Neurological: She is alert and oriented to person, place, and time.  Skin: She is not diaphoretic.  Psychiatric: She has a normal mood and affect. Her behavior is normal.     Assessment and Plan: [name] is a 66 y.o. female who is here today for sinus congestioon. Bronchospasm - Plan: chlorpheniramine-HYDROcodone (TUSSIONEX PENNKINETIC ER) 10-8 MG/5ML SUER, benzonatate (TESSALON) 100 MG capsule, azithromycin (ZITHROMAX) 250 MG tablet, DISCONTINUED: beclomethasone (QVAR) 40 MCG/ACT inhaler  Lower respiratory infection (e.g., bronchitis, pneumonia, pneumonitis, pulmonitis) - Plan: chlorpheniramine-HYDROcodone (TUSSIONEX PENNKINETIC ER) 10-8 MG/5ML SUER, benzonatate (TESSALON) 100 MG capsule, azithromycin (ZITHROMAX) 250 MG tablet, DISCONTINUED: beclomethasone (QVAR) 40 MCG/ACT inhaler  [name], PA-C [facility] 3/16/20183:41 PM

## 2016-04-05 NOTE — Telephone Encounter (Signed)
Lm with appt reminder 3/17 830am

## 2016-04-06 ENCOUNTER — Encounter: Payer: Self-pay | Admitting: Family Medicine

## 2016-04-06 ENCOUNTER — Ambulatory Visit (INDEPENDENT_AMBULATORY_CARE_PROVIDER_SITE_OTHER): Payer: Medicare HMO | Admitting: Family Medicine

## 2016-04-06 VITALS — BP 138/88 | HR 76 | Temp 97.5°F | Resp 16 | Ht 65.5 in | Wt 193.0 lb

## 2016-04-06 DIAGNOSIS — Z9081 Acquired absence of spleen: Secondary | ICD-10-CM | POA: Diagnosis not present

## 2016-04-06 DIAGNOSIS — M85852 Other specified disorders of bone density and structure, left thigh: Secondary | ICD-10-CM | POA: Diagnosis not present

## 2016-04-06 DIAGNOSIS — J310 Chronic rhinitis: Secondary | ICD-10-CM | POA: Diagnosis not present

## 2016-04-06 DIAGNOSIS — H401132 Primary open-angle glaucoma, bilateral, moderate stage: Secondary | ICD-10-CM | POA: Diagnosis not present

## 2016-04-06 DIAGNOSIS — E7800 Pure hypercholesterolemia, unspecified: Secondary | ICD-10-CM

## 2016-04-06 DIAGNOSIS — D473 Essential (hemorrhagic) thrombocythemia: Secondary | ICD-10-CM | POA: Diagnosis not present

## 2016-04-06 DIAGNOSIS — M85851 Other specified disorders of bone density and structure, right thigh: Secondary | ICD-10-CM | POA: Diagnosis not present

## 2016-04-06 DIAGNOSIS — Z6831 Body mass index (BMI) 31.0-31.9, adult: Secondary | ICD-10-CM

## 2016-04-06 DIAGNOSIS — E6609 Other obesity due to excess calories: Secondary | ICD-10-CM | POA: Diagnosis not present

## 2016-04-06 DIAGNOSIS — D75839 Thrombocytosis, unspecified: Secondary | ICD-10-CM

## 2016-04-06 DIAGNOSIS — Z0000 Encounter for general adult medical examination without abnormal findings: Secondary | ICD-10-CM

## 2016-04-06 NOTE — Patient Instructions (Addendum)
   IF you received an x-ray today, you will receive an invoice from [facility]. Please contact [facility] at [phone] with questions or concerns regarding your invoice.   IF you received labwork today, you will receive an invoice from [facility]. Please contact [facility] at [phone] with questions or concerns regarding your invoice.   Our billing staff will not be able to assist you with questions regarding bills from these companies.  You will be contacted with the lab results as soon as they are available. The fastest way to get your results is to activate your My Chart account. Instructions are located on the last page of this paperwork. If you have not heard from us regarding the results in 2 weeks, please contact this office.     Advance Directive Advance directives are legal documents that let you make choices ahead of time about your health care and medical treatment in case you become unable to communicate for yourself. Advance directives are a way for you to communicate your wishes to family, friends, and health care providers. This can help convey your decisions about end-of-life care if you become unable to communicate. Discussing and writing advance directives should happen over time rather than all at once. Advance directives can be changed depending on your situation and what you want, even after you have signed the advance directives. If you do not have an advance directive, some states assign family decision makers to act on your behalf based on how closely you are related to them. Each state has its own laws regarding advance directives. You may want to check with your health care provider, attorney, or state representative about the laws in your state. There are different types of advance directives, such as:  Medical power of attorney.  Living will.  Do not resuscitate (DNR) or do not attempt resuscitation (DNAR) order.  Health care proxy and  medical power of attorney A health care proxy, also called a health care agent, is a person who is appointed to make medical decisions for you in cases in which you are unable to make the decisions yourself. Generally, people choose someone they know well and trust to represent their preferences. Make sure to ask this person for an agreement to act as your proxy. A proxy may have to exercise judgment in the event of a medical decision for which your wishes are not known. A medical power of attorney is a legal document that names your health care proxy. Depending on the laws in your state, after the document is written, it may also need to be:  Signed.  Notarized.  Dated.  Copied.  Witnessed.  Incorporated into your medical record.  You may also want to appoint someone to manage your financial affairs in a situation in which you are unable to do so. This is called a durable power of attorney for finances. It is a separate legal document from the durable power of attorney for health care. You may choose the same person or someone different from your health care proxy to act as your agent in financial matters. If you do not appoint a proxy, or if there is a concern that the proxy is not acting in your best interests, a court-appointed guardian may be designated to act on your behalf. Living will A living will is a set of instructions documenting your wishes about medical care when you cannot express them yourself. Health care providers should keep a copy of your living will in   your medical record. You may want to give a copy to family members or friends. To alert caregivers in case of an emergency, you can place a card in your wallet to let them know that you have a living will and where they can find it. A living will is used if you become:  Terminally ill.  Incapacitated.  Unable to communicate or make decisions.  Items to consider in your living will include:  The use or non-use of  life-sustaining equipment, such as dialysis machines and breathing machines (ventilators).  A DNR or DNAR order, which is the instruction not to use cardiopulmonary resuscitation (CPR) if breathing or heartbeat stops.  The use or non-use of tube feeding.  Withholding of food and fluids.  Comfort (palliative) care when the goal becomes comfort rather than a cure.  Organ and tissue donation.  A living will does not give instructions for distributing your money and property if you should pass away. It is recommended that you seek the advice of a lawyer when writing a will. Decisions about taxes, beneficiaries, and asset distribution will be legally binding. This process can relieve your family and friends of any concerns surrounding disputes or questions that may come up about the distribution of your assets. DNR or DNAR A DNR or DNAR order is a request not to have CPR in the event that your heart stops beating or you stop breathing. If a DNR or DNAR order has not been made and shared, a health care provider will try to help any patient whose heart has stopped or who has stopped breathing. If you plan to have surgery, talk with your health care provider about how your DNR or DNAR order will be followed if problems occur. Summary  Advance directives are the legal documents that allow you to make choices ahead of time about your health care and medical treatment in case you become unable to communicate for yourself.  The process of discussing and writing advance directives should happen over time. You can change the advance directives, even after you have signed them.  Advance directives include DNR or DNAR orders, living wills, and designating an agent as your medical power of attorney. This information is not intended to replace advice given to you by your health care provider. Make sure you discuss any questions you have with your health care provider. Document Released: 04/16/2007 Document  Revised: 11/27/2015 Document Reviewed: 11/27/2015 Elsevier Interactive Patient Education  2017 [employer]  

## 2016-04-07 LAB — CBC WITH DIFFERENTIAL/PLATELET
BASOS: 1 %
Basophils Absolute: 0.1 10*3/uL (ref 0.0–0.2)
EOS (ABSOLUTE): 0.2 10*3/uL (ref 0.0–0.4)
EOS: 3 %
HEMATOCRIT: 43.3 % (ref 34.0–46.6)
Hemoglobin: 14.4 g/dL (ref 11.1–15.9)
Immature Grans (Abs): 0 10*3/uL (ref 0.0–0.1)
Immature Granulocytes: 0 %
LYMPHS ABS: 4.3 10*3/uL — AB (ref 0.7–3.1)
Lymphs: 44 %
MCH: 29.9 pg (ref 26.6–33.0)
MCHC: 33.3 g/dL (ref 31.5–35.7)
MCV: 90 fL (ref 79–97)
MONOS ABS: 0.9 10*3/uL (ref 0.1–0.9)
Monocytes: 10 %
Neutrophils Absolute: 4 10*3/uL (ref 1.4–7.0)
Neutrophils: 42 %
Platelets: 531 10*3/uL — ABNORMAL HIGH (ref 150–379)
RBC: 4.82 x10E6/uL (ref 3.77–5.28)
RDW: 14.1 % (ref 12.3–15.4)
WBC: 9.5 10*3/uL (ref 3.4–10.8)

## 2016-04-07 LAB — LIPID PANEL
Chol/HDL Ratio: 5.7 ratio units — ABNORMAL HIGH (ref 0.0–4.4)
Cholesterol, Total: 256 mg/dL — ABNORMAL HIGH (ref 100–199)
HDL: 45 mg/dL (ref >39)
LDL CALC: 152 mg/dL — AB (ref 0–99)
Triglycerides: 295 mg/dL — ABNORMAL HIGH (ref 0–149)
VLDL Cholesterol Cal: 59 mg/dL — ABNORMAL HIGH (ref 5–40)

## 2016-04-10 ENCOUNTER — Encounter: Payer: Self-pay | Admitting: Family Medicine

## 2016-04-12 LAB — COMPREHENSIVE METABOLIC PANEL
ALBUMIN: 4.3 g/dL (ref 3.6–4.8)
ALK PHOS: 97 IU/L (ref 39–117)
ALT: 36 IU/L — ABNORMAL HIGH (ref 0–32)
AST: 25 IU/L (ref 0–40)
Albumin/Globulin Ratio: 1.4 (ref 1.2–2.2)
BUN / CREAT RATIO: 20 (ref 12–28)
BUN: 14 mg/dL (ref 8–27)
Bilirubin Total: 0.2 mg/dL (ref 0.0–1.2)
CO2: 18 mmol/L (ref 18–29)
Calcium: 10 mg/dL (ref 8.7–10.3)
Chloride: 103 mmol/L (ref 96–106)
Creatinine, Ser: 0.71 mg/dL (ref 0.57–1.00)
GFR calc non Af Amer: 89 mL/min/{1.73_m2} (ref >59)
GFR, EST AFRICAN AMERICAN: 103 mL/min/{1.73_m2} (ref >59)
GLOBULIN, TOTAL: 3 g/dL (ref 1.5–4.5)
Glucose: 102 mg/dL — ABNORMAL HIGH (ref 65–99)
Potassium: 4.8 mmol/L (ref 3.5–5.2)
SODIUM: 143 mmol/L (ref 134–144)
Total Protein: 7.3 g/dL (ref 6.0–8.5)

## 2016-04-12 LAB — SPECIMEN STATUS REPORT

## 2016-05-19 DIAGNOSIS — M85851 Other specified disorders of bone density and structure, right thigh: Secondary | ICD-10-CM | POA: Insufficient documentation

## 2016-05-19 DIAGNOSIS — Z6831 Body mass index (BMI) 31.0-31.9, adult: Secondary | ICD-10-CM

## 2016-05-19 DIAGNOSIS — M85852 Other specified disorders of bone density and structure, left thigh: Secondary | ICD-10-CM

## 2016-05-19 DIAGNOSIS — E6609 Other obesity due to excess calories: Secondary | ICD-10-CM | POA: Insufficient documentation

## 2016-05-19 NOTE — Progress Notes (Signed)
Subjective:    Patient ID: Sabrina Knight, female    DOB: [birth year], 66 y.o.   MRN: [id number]  04/06/2016  Annual Exam   HPI This 66 y.o. female presents for Annual Wellness Examination and Complete Physical Examination and follow-up of chronic medical conditions.  Last physical: 03-28-2015 Pap smear: hysterectomy 2001 fibroids and bleeding; no abnormal pap smears; ovaries remove.d Mammogram:  10-16-2015 Colonoscopy:  08-01-2011 Bone density:  10-16-2015 osteopenia.  There is no immunization history for the selected administration types on file for this patient. BP Readings from Last 3 Encounters:  04/06/16 138/88  03/26/16 130/82  10/03/15 120/60   Wt Readings from Last 3 Encounters:  04/06/16 193 lb (87.5 kg)  03/26/16 192 lb 3.2 oz (87.2 kg)  10/03/15 186 lb (84.4 kg)     Visual Acuity Screening   Right eye Left eye Both eyes  Without correction:     With correction: 20/20 20/40 20/15  -1   Hypercholesterolemia: LDL 165 in 09/2015; Pravastatin started.  Allergic Rhinitis: Patient reports good compliance with medication, good tolerance to medication, and good symptom control.     Review of Systems  Constitutional: Negative for activity change, appetite change, chills, diaphoresis, fatigue, fever and unexpected weight change.  HENT: Negative for congestion, dental problem, drooling, ear discharge, ear pain, facial swelling, hearing loss, mouth sores, nosebleeds, postnasal drip, rhinorrhea, sinus pressure, sneezing, sore throat, tinnitus, trouble swallowing and voice change.   Eyes: Negative for photophobia, pain, discharge, redness, itching and visual disturbance.  Respiratory: Negative for apnea, cough, choking, chest tightness, shortness of breath, wheezing and stridor.   Cardiovascular: Negative for chest pain, palpitations and leg swelling.  Gastrointestinal: Negative for abdominal distention, abdominal pain, anal bleeding, blood in stool, constipation, diarrhea,  nausea, rectal pain and vomiting.  Endocrine: Negative for cold intolerance, heat intolerance, polydipsia, polyphagia and polyuria.  Genitourinary: Negative for decreased urine volume, difficulty urinating, dyspareunia, dysuria, enuresis, flank pain, frequency, genital sores, hematuria, menstrual problem, pelvic pain, urgency, vaginal bleeding, vaginal discharge and vaginal pain.  Musculoskeletal: Negative for arthralgias, back pain, gait problem, joint swelling, myalgias, neck pain and neck stiffness.  Skin: Negative for color change, pallor, rash and wound.  Allergic/Immunologic: Negative for environmental allergies, food allergies and immunocompromised state.  Neurological: Negative for dizziness, tremors, seizures, syncope, facial asymmetry, speech difficulty, weakness, light-headedness, numbness and headaches.  Hematological: Negative for adenopathy. Does not bruise/bleed easily.  Psychiatric/Behavioral: Negative for agitation, behavioral problems, confusion, decreased concentration, dysphoric mood, hallucinations, self-injury, sleep disturbance and suicidal ideas. The patient is not nervous/anxious and is not hyperactive.     Past Medical History:  Diagnosis Date  . Allergy    Zyrtec, Singulair; allergy testing negative; Orvil Feil.  . Cataract   . Clotting disorder ([place])   . Glaucoma   . ITP (idiopathic thrombocytopenic purpura)    Past Surgical History:  Procedure Laterality Date  . ABDOMINAL HYSTERECTOMY  01/22/1999   ovaries resected; DUB/fibroids  . 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 spleen removal     No Known Allergies Current Outpatient Prescriptions  Medication Sig Dispense Refill  . chlorpheniramine-HYDROcodone (TUSSIONEX PENNKINETIC ER) 10-8 MG/5ML SUER Take 5 mLs by mouth at bedtime as needed. 60 mL 0  . montelukast (SINGULAIR) 10 MG tablet Take 10 mg by mouth daily.    . Multiple Vitamin (MULTIVITAMIN WITH MINERALS) TABS tablet Take 1 tablet by mouth daily.    .  Olopatadine HCl 0.6 % SOLN Place 1 spray into the nose daily.    Marland Kitchen  pravastatin (PRAVACHOL) 20 MG tablet Take 1 tablet (20 mg total) by mouth daily. 90 tablet 1  . PRESCRIPTION MEDICATION Zyrtec 10 mg taking daily    . PRESCRIPTION MEDICATION Patonase nasal spray 665 mg taking daily    . carboxymethylcellulose (REFRESH PLUS) 0.5 % SOLN Apply 1 drop to eye 3 (three) times daily as needed (for dry eyes).     No current facility-administered medications for this visit.    Social History   Social History  . Marital status: Widowed    Spouse name: N/A  . Number of children: N/A  . Years of education: N/A   Occupational History  . Retired    Social History Main Topics  . Smoking status: Former Smoker    Packs/day: 0.50    Quit date: 12/03/2013  . Smokeless tobacco: Never Used  . Alcohol use No  . Drug use: No  . Sexual activity: No   Other Topics Concern  . Not on file   Social History Narrative   Marital status:  Widowed x since 2001 from [place]; married x 27 years.  Not dating really in 2017.      Children:  None; failed in vitro.      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      Tobacco: quit ; smoked 20 years.      Alcohol: none      Education: [employer].       Exercise: Yes; 3 times per week 2 hours deep water aerobics.      ADLs: independent ADLs; no assistant devices.      Advanced Directives:  None; FULL CODE; no prolonged measures.     Family History  Problem Relation Age of Onset  . Cancer Mother 109    lung cancer  . Hypertension Mother   . Hyperlipidemia Mother   . Mental illness Mother   . Hypertension Sister   . Hypertension Brother   . Diabetes Brother   . Cancer Brother 26    bladder cancer; non-smoker  . Heart disease Brother 29    CABG/CAD  . Hypertension Sister   . Diabetes Sister   . Hypertension Brother   . Diabetes Brother   . Hypertension Brother   . Diabetes Brother   . Hypertension Brother   .  COPD Father        Objective:    BP 138/88   Pulse 76   Temp 97.5 F (36.4 C) (Oral)   Resp 16   Ht 5' 5.5" (1.664 m)   Wt 193 lb (87.5 kg)   SpO2 95%   BMI 31.63 kg/m  Physical Exam  Constitutional: She is oriented to person, place, and time. She appears well-developed and well-nourished. No distress.  HENT:  Head: Normocephalic and atraumatic.  Right Ear: External ear normal.  Left Ear: External ear normal.  Nose: Nose normal.  Mouth/Throat: Oropharynx is clear and moist.  Eyes: Conjunctivae and EOM are normal. Pupils are equal, round, and reactive to light.  Neck: Normal range of motion and full passive range of motion without pain. Neck supple. No JVD present. Carotid bruit is not present. No thyromegaly present.  Cardiovascular: Normal rate, regular rhythm and normal heart sounds.  Exam reveals no gallop and no friction rub.   No murmur heard. Pulmonary/Chest: Effort normal and breath sounds normal. She has no wheezes. She has no rales. Right breast exhibits no inverted nipple, no mass, no  nipple discharge, no skin change and no tenderness. Left breast exhibits no inverted nipple, no mass, no nipple discharge, no skin change and no tenderness. Breasts are symmetrical.  Abdominal: Soft. Bowel sounds are normal. She exhibits no distension and no mass. There is no tenderness. There is no rebound and no guarding.  Musculoskeletal:       Right shoulder: Normal.       Left shoulder: Normal.       Cervical back: Normal.  Lymphadenopathy:    She has no cervical adenopathy.  Neurological: She is alert and oriented to person, place, and time. She has normal reflexes. No cranial nerve deficit. She exhibits normal muscle tone. Coordination normal.  Skin: Skin is warm and dry. No rash noted. She is not diaphoretic. No erythema. No pallor.  Psychiatric: She has a normal mood and affect. Her behavior is normal. Judgment and thought content normal.  Nursing note and vitals  reviewed.  Depression screen [facility] 2/9 04/06/2016 03/26/2016 10/03/2015 03/28/2015 01/10/2015  Decreased Interest 0 0 0 0 0  Down, Depressed, Hopeless 0 0 0 0 0  PHQ - 2 Score 0 0 0 0 0   Fall Risk  04/06/2016 03/26/2016 10/03/2015 03/28/2015  Falls in the past year? No No No No   Functional Status Survey: Is the patient deaf or have difficulty hearing?: No Does the patient have difficulty seeing, even when wearing glasses/contacts?: No Does the patient have difficulty concentrating, remembering, or making decisions?: No Does the patient have difficulty walking or climbing stairs?: No Does the patient have difficulty dressing or bathing?: No Does the patient have difficulty doing errands alone such as visiting a doctor's office or shopping?: No      Assessment & Plan:   1. Medicare annual wellness visit, initial   2. Routine physical examination   3. High cholesterol   4. H/O splenectomy   5. Primary open angle glaucoma of both eyes, moderate stage   6. Non-allergic rhinitis   7. Class 1 obesity due to excess calories with serious comorbidity and body mass index (BMI) of 31.0 to 31.9 in adult   8. Thrombocytosis ([place])   9. Osteopenia of necks of both femurs    -anticipatory guidance --- exercise, weight loss, and calcium supplementation. -no evidence of hearing loss; low fall risk. No urinary incontinence.  Independent with ADLs.  Discussed advanced directives in detail during visit. -health maintenance reviewed and updated. -obtain labs for chronic disease management.  Orders Placed This Encounter  Procedures  . CBC with Differential/Platelet  . Lipid panel  . Comprehensive metabolic panel  . Specimen status report  . POCT urinalysis dipstick   No orders of the defined types were placed in this encounter.   Return in about 6 months (around 10/07/2016) for high cholesterol .   Addison Whidbee Elayne Guerin, M.D. Primary Care at [facility] previously [facility] [address] [place], [place]  [address] [phone] phone [phone] fax

## 2016-05-22 ENCOUNTER — Telehealth: Payer: Self-pay | Admitting: Family Medicine

## 2016-05-22 NOTE — Telephone Encounter (Signed)
DR Tamala Julian PT CALLING FOR A RX OF LIPITOR THE LOWEST DOSE TO BE SENT TO [facility]

## 2016-05-23 DIAGNOSIS — H40013 Open angle with borderline findings, low risk, bilateral: Secondary | ICD-10-CM | POA: Diagnosis not present

## 2016-05-23 DIAGNOSIS — H04123 Dry eye syndrome of bilateral lacrimal glands: Secondary | ICD-10-CM | POA: Diagnosis not present

## 2016-05-23 DIAGNOSIS — H1013 Acute atopic conjunctivitis, bilateral: Secondary | ICD-10-CM | POA: Diagnosis not present

## 2016-05-23 DIAGNOSIS — H40033 Anatomical narrow angle, bilateral: Secondary | ICD-10-CM | POA: Diagnosis not present

## 2016-05-23 NOTE — Telephone Encounter (Signed)
I see pravastatin on list

## 2016-06-25 DIAGNOSIS — Z7982 Long term (current) use of aspirin: Secondary | ICD-10-CM | POA: Diagnosis not present

## 2016-06-25 DIAGNOSIS — Z0000 Encounter for general adult medical examination without abnormal findings: Secondary | ICD-10-CM | POA: Diagnosis not present

## 2016-06-25 DIAGNOSIS — Z79899 Other long term (current) drug therapy: Secondary | ICD-10-CM | POA: Diagnosis not present

## 2016-06-25 DIAGNOSIS — H259 Unspecified age-related cataract: Secondary | ICD-10-CM | POA: Diagnosis not present

## 2016-06-25 DIAGNOSIS — E669 Obesity, unspecified: Secondary | ICD-10-CM | POA: Diagnosis not present

## 2016-06-25 DIAGNOSIS — H409 Unspecified glaucoma: Secondary | ICD-10-CM | POA: Diagnosis not present

## 2016-06-25 DIAGNOSIS — Z87891 Personal history of nicotine dependence: Secondary | ICD-10-CM | POA: Diagnosis not present

## 2016-06-25 DIAGNOSIS — J309 Allergic rhinitis, unspecified: Secondary | ICD-10-CM | POA: Diagnosis not present

## 2016-06-25 DIAGNOSIS — Z6832 Body mass index (BMI) 32.0-32.9, adult: Secondary | ICD-10-CM | POA: Diagnosis not present

## 2016-06-27 DIAGNOSIS — H40013 Open angle with borderline findings, low risk, bilateral: Secondary | ICD-10-CM | POA: Diagnosis not present

## 2016-06-27 DIAGNOSIS — H25013 Cortical age-related cataract, bilateral: Secondary | ICD-10-CM | POA: Diagnosis not present

## 2016-06-27 DIAGNOSIS — H40033 Anatomical narrow angle, bilateral: Secondary | ICD-10-CM | POA: Diagnosis not present

## 2016-06-27 DIAGNOSIS — H2511 Age-related nuclear cataract, right eye: Secondary | ICD-10-CM | POA: Diagnosis not present

## 2016-06-27 DIAGNOSIS — H2513 Age-related nuclear cataract, bilateral: Secondary | ICD-10-CM | POA: Diagnosis not present

## 2016-07-23 DIAGNOSIS — H25811 Combined forms of age-related cataract, right eye: Secondary | ICD-10-CM | POA: Diagnosis not present

## 2016-07-23 DIAGNOSIS — H2511 Age-related nuclear cataract, right eye: Secondary | ICD-10-CM | POA: Diagnosis not present

## 2016-08-13 DIAGNOSIS — J018 Other acute sinusitis: Secondary | ICD-10-CM | POA: Diagnosis not present

## 2016-08-14 ENCOUNTER — Ambulatory Visit: Payer: Medicare HMO | Admitting: Family Medicine

## 2016-08-14 DIAGNOSIS — H25012 Cortical age-related cataract, left eye: Secondary | ICD-10-CM | POA: Diagnosis not present

## 2016-08-14 DIAGNOSIS — H2512 Age-related nuclear cataract, left eye: Secondary | ICD-10-CM | POA: Diagnosis not present

## 2016-08-20 DIAGNOSIS — H25812 Combined forms of age-related cataract, left eye: Secondary | ICD-10-CM | POA: Diagnosis not present

## 2016-08-20 DIAGNOSIS — H2512 Age-related nuclear cataract, left eye: Secondary | ICD-10-CM | POA: Diagnosis not present

## 2016-09-26 DIAGNOSIS — J4530 Mild persistent asthma, uncomplicated: Secondary | ICD-10-CM | POA: Diagnosis not present

## 2016-09-26 DIAGNOSIS — J300 Vasomotor rhinitis: Secondary | ICD-10-CM | POA: Diagnosis not present
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
## 2016-10-08 ENCOUNTER — Ambulatory Visit (INDEPENDENT_AMBULATORY_CARE_PROVIDER_SITE_OTHER): Payer: Medicare HMO | Admitting: Family Medicine

## 2016-10-08 ENCOUNTER — Encounter: Payer: Self-pay | Admitting: Family Medicine

## 2016-10-08 VITALS — BP 122/78 | HR 66 | Temp 98.0°F | Resp 16 | Ht 65.35 in | Wt 191.0 lb

## 2016-10-08 DIAGNOSIS — Z6831 Body mass index (BMI) 31.0-31.9, adult: Secondary | ICD-10-CM | POA: Diagnosis not present

## 2016-10-08 DIAGNOSIS — E6609 Other obesity due to excess calories: Secondary | ICD-10-CM

## 2016-10-08 DIAGNOSIS — Z9081 Acquired absence of spleen: Secondary | ICD-10-CM | POA: Diagnosis not present

## 2016-10-08 DIAGNOSIS — D75839 Thrombocytosis, unspecified: Secondary | ICD-10-CM

## 2016-10-08 DIAGNOSIS — Z23 Encounter for immunization: Secondary | ICD-10-CM | POA: Diagnosis not present

## 2016-10-08 DIAGNOSIS — E7800 Pure hypercholesterolemia, unspecified: Secondary | ICD-10-CM

## 2016-10-08 DIAGNOSIS — L570 Actinic keratosis: Secondary | ICD-10-CM

## 2016-10-08 DIAGNOSIS — M85851 Other specified disorders of bone density and structure, right thigh: Secondary | ICD-10-CM

## 2016-10-08 DIAGNOSIS — D473 Essential (hemorrhagic) thrombocythemia: Secondary | ICD-10-CM | POA: Diagnosis not present

## 2016-10-08 DIAGNOSIS — J310 Chronic rhinitis: Secondary | ICD-10-CM | POA: Diagnosis not present

## 2016-10-08 DIAGNOSIS — S60511A Abrasion of right hand, initial encounter: Secondary | ICD-10-CM

## 2016-10-08 DIAGNOSIS — M85852 Other specified disorders of bone density and structure, left thigh: Secondary | ICD-10-CM | POA: Diagnosis not present

## 2016-10-08 DIAGNOSIS — H401132 Primary open-angle glaucoma, bilateral, moderate stage: Secondary | ICD-10-CM

## 2016-10-08 MED ORDER — PRAVASTATIN SODIUM 20 MG PO TABS: 20.0000 mg | ORAL_TABLET | Freq: Every day | ORAL | 1 refills | Status: DC

## 2016-10-08 NOTE — Patient Instructions (Addendum)
   IF you received an x-ray today, you will receive an invoice from [facility]. Please contact [facility] at [phone] with questions or concerns regarding your invoice.   IF you received labwork today, you will receive an invoice from [facility]. Please contact [facility] at [phone] with questions or concerns regarding your invoice.   Our billing staff will not be able to assist you with questions regarding bills from these companies.  You will be contacted with the lab results as soon as they are available. The fastest way to get your results is to activate your My Chart account. Instructions are located on the last page of this paperwork. If you have not heard from us regarding the results in 2 weeks, please contact this office.      Fat and Cholesterol Restricted Diet Getting too much fat and cholesterol in your diet may cause health problems. Following this diet helps keep your fat and cholesterol at normal levels. This can keep you from getting sick. What types of fat should I choose?  Choose monosaturated and polyunsaturated fats. These are found in foods such as olive oil, canola oil, flaxseeds, walnuts, almonds, and seeds.  Eat more omega-3 fats. Good choices include salmon, mackerel, sardines, tuna, flaxseed oil, and ground flaxseeds.  Limit saturated fats. These are in animal products such as meats, butter, and cream. They can also be in plant products such as palm oil, palm kernel oil, and coconut oil.  Avoid foods with partially hydrogenated oils in them. These contain trans fats. Examples of foods that have trans fats are stick margarine, some tub margarines, cookies, crackers, and other baked goods. What general guidelines do I need to follow?  Check food labels. Look for the words "trans fat" and "saturated fat."  When preparing a meal: ? Fill half of your plate with vegetables and green salads. ? Fill one fourth of your plate with whole  grains. Look for the word "whole" as the first word in the ingredient list. ? Fill one fourth of your plate with lean protein foods.  Eat more foods that have fiber, like apples, carrots, beans, peas, and barley.  Eat more home-cooked foods. Eat less at restaurants and buffets.  Limit or avoid alcohol.  Limit foods high in starch and sugar.  Limit fried foods.  Cook foods without frying them. Baking, boiling, grilling, and broiling are all great options.  Lose weight if you are overweight. Losing even a small amount of weight can help your overall health. It can also help prevent diseases such as diabetes and heart disease. What foods can I eat? Grains Whole grains, such as whole wheat or whole grain breads, crackers, cereals, and pasta. Unsweetened oatmeal, bulgur, barley, quinoa, or brown rice. Corn or whole wheat flour tortillas. Vegetables Fresh or frozen vegetables (raw, steamed, roasted, or grilled). Green salads. Fruits All fresh, canned (in natural juice), or frozen fruits. Meat and Other Protein Products Ground beef (85% or leaner), grass-fed beef, or beef trimmed of fat. Skinless chicken or turkey. Ground chicken or turkey. Pork trimmed of fat. All fish and seafood. Eggs. Dried beans, peas, or lentils. Unsalted nuts or seeds. Unsalted canned or dry beans. Dairy Low-fat dairy products, such as skim or 1% milk, 2% or reduced-fat cheeses, low-fat ricotta or cottage cheese, or plain low-fat yogurt. Fats and Oils Tub margarines without trans fats. Light or reduced-fat mayonnaise and salad dressings. Avocado. Olive, canola, sesame, or safflower oils. Natural peanut or almond butter (choose ones without   added sugar and oil). The items listed above may not be a complete list of recommended foods or beverages. Contact your dietitian for more options. What foods are not recommended? Grains White bread. White pasta. White rice. Cornbread. Bagels, pastries, and croissants. Crackers that  contain trans fat. Vegetables White potatoes. Corn. Creamed or fried vegetables. Vegetables in a cheese sauce. Fruits Dried fruits. Canned fruit in light or heavy syrup. Fruit juice. Meat and Other Protein Products Fatty cuts of meat. Ribs, chicken wings, bacon, sausage, bologna, salami, chitterlings, fatback, hot dogs, bratwurst, and packaged luncheon meats. Liver and organ meats. Dairy Whole or 2% milk, cream, half-and-half, and cream cheese. Whole milk cheeses. Whole-fat or sweetened yogurt. Full-fat cheeses. Nondairy creamers and whipped toppings. Processed cheese, cheese spreads, or cheese curds. Sweets and Desserts Corn syrup, sugars, honey, and molasses. Candy. Jam and jelly. Syrup. Sweetened cereals. Cookies, pies, cakes, donuts, muffins, and ice cream. Fats and Oils Butter, stick margarine, lard, shortening, ghee, or bacon fat. Coconut, palm kernel, or palm oils. Beverages Alcohol. Sweetened drinks (such as sodas, lemonade, and fruit drinks or punches). The items listed above may not be a complete list of foods and beverages to avoid. Contact your dietitian for more information. This information is not intended to replace advice given to you by your health care provider. Make sure you discuss any questions you have with your health care provider. Document Released: 07/09/2011 Document Revised: 09/14/2015 Document Reviewed: 04/08/2013 Elsevier Interactive Patient Education  2018 [employer]  

## 2016-10-09 LAB — COMPREHENSIVE METABOLIC PANEL
A/G RATIO: 1.5 (ref 1.2–2.2)
ALBUMIN: 4.5 g/dL (ref 3.6–4.8)
ALT: 44 IU/L — ABNORMAL HIGH (ref 0–32)
AST: 32 IU/L (ref 0–40)
Alkaline Phosphatase: 103 IU/L (ref 39–117)
BILIRUBIN TOTAL: 0.3 mg/dL (ref 0.0–1.2)
BUN / CREAT RATIO: 20 (ref 12–28)
BUN: 15 mg/dL (ref 8–27)
CHLORIDE: 103 mmol/L (ref 96–106)
CO2: 21 mmol/L (ref 20–29)
Calcium: 10 mg/dL (ref 8.7–10.3)
Creatinine, Ser: 0.76 mg/dL (ref 0.57–1.00)
GFR calc non Af Amer: 81 mL/min/{1.73_m2} (ref >59)
GFR, EST AFRICAN AMERICAN: 94 mL/min/{1.73_m2} (ref >59)
GLOBULIN, TOTAL: 3 g/dL (ref 1.5–4.5)
GLUCOSE: 108 mg/dL — AB (ref 65–99)
Potassium: 4.8 mmol/L (ref 3.5–5.2)
Sodium: 141 mmol/L (ref 134–144)
TOTAL PROTEIN: 7.5 g/dL (ref 6.0–8.5)

## 2016-10-09 LAB — LIPID PANEL
Chol/HDL Ratio: 5.1 ratio — ABNORMAL HIGH (ref 0.0–4.4)
Cholesterol, Total: 257 mg/dL — ABNORMAL HIGH (ref 100–199)
HDL: 50 mg/dL (ref >39)
LDL CALC: 143 mg/dL — AB (ref 0–99)
Triglycerides: 322 mg/dL — ABNORMAL HIGH (ref 0–149)
VLDL CHOLESTEROL CAL: 64 mg/dL — AB (ref 5–40)

## 2016-10-09 LAB — CBC WITH DIFFERENTIAL/PLATELET
Basophils Absolute: 0.1 10*3/uL (ref 0.0–0.2)
Basos: 1 %
EOS (ABSOLUTE): 0.2 10*3/uL (ref 0.0–0.4)
EOS: 2 %
HEMATOCRIT: 43.9 % (ref 34.0–46.6)
HEMOGLOBIN: 14.8 g/dL (ref 11.1–15.9)
IMMATURE GRANS (ABS): 0 10*3/uL (ref 0.0–0.1)
Immature Granulocytes: 0 %
LYMPHS: 50 %
Lymphocytes Absolute: 4.4 10*3/uL — ABNORMAL HIGH (ref 0.7–3.1)
MCH: 31.1 pg (ref 26.6–33.0)
MCHC: 33.7 g/dL (ref 31.5–35.7)
MCV: 92 fL (ref 79–97)
MONOCYTES: 9 %
Monocytes Absolute: 0.8 10*3/uL (ref 0.1–0.9)
NEUTROS ABS: 3.3 10*3/uL (ref 1.4–7.0)
Neutrophils: 38 %
Platelets: 520 10*3/uL — ABNORMAL HIGH (ref 150–379)
RBC: 4.76 x10E6/uL (ref 3.77–5.28)
RDW: 14.2 % (ref 12.3–15.4)
WBC: 8.8 10*3/uL (ref 3.4–10.8)

## 2016-10-15 DIAGNOSIS — Z1231 Encounter for screening mammogram for malignant neoplasm of breast: Secondary | ICD-10-CM | POA: Diagnosis not present

## 2016-10-16 ENCOUNTER — Encounter: Payer: Self-pay | Admitting: Family Medicine

## 2016-10-16 ENCOUNTER — Ambulatory Visit (INDEPENDENT_AMBULATORY_CARE_PROVIDER_SITE_OTHER): Payer: Medicare HMO | Admitting: Family Medicine

## 2016-10-16 VITALS — BP 122/72 | HR 85 | Temp 98.2°F | Resp 17 | Ht 66.5 in | Wt 190.0 lb

## 2016-10-16 DIAGNOSIS — J209 Acute bronchitis, unspecified: Secondary | ICD-10-CM | POA: Diagnosis not present

## 2016-10-16 MED ORDER — HYDROCOD POLST-CPM POLST ER 10-8 MG/5ML PO SUER: 5.0000 mL | Freq: Two times a day (BID) | ORAL | 0 refills | Status: DC | PRN

## 2016-10-16 MED ORDER — BENZONATATE 200 MG PO CAPS: 200.0000 mg | ORAL_CAPSULE | Freq: Three times a day (TID) | ORAL | 0 refills | Status: DC | PRN

## 2016-10-16 NOTE — Progress Notes (Signed)
Subjective:  By signing my name below, I, [name], attest that this documentation has been prepared under the direction and in the presence of Delman Cheadle, MD Electronically Signed: Ladene Artist, ED Scribe 10/16/2016 at 4:09 PM.   Patient ID: Sabrina Knight, female    DOB: 03/01/1950, 66 y.o.   MRN: [id number]  Chief Complaint  Patient presents with  . Cough   HPI Sabrina Knight is a 66 y.o. female who presents to Primary Care at [facility] complaining of gradually improving productive cough with yellow sputum x 3 days ago. Pt states symptoms started as scratchy throat after water aerobics and progressed to cough that is worse at night. She reports associated symptoms of intermittent nasal congestion, rhinorrhea, low grade fever of 99.7 F yesterday. Pt has tried Mucinex, Human resources officer in the morning, Singulair at night and [employer]. She has tried Mining engineer and Tussinex in the past which she states worked well. 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  Past Medical History:  Diagnosis Date  . Allergy    Zyrtec, Singulair; allergy testing negative; Orvil Feil.  . Cataract   . Clotting disorder ([place])   . Glaucoma   . ITP (idiopathic thrombocytopenic purpura)    Current Outpatient Prescriptions on File Prior to Visit  Medication Sig Dispense Refill  . carboxymethylcellulose (REFRESH PLUS) 0.5 % SOLN Apply 1 drop to eye 3 (three) times daily as needed (for dry eyes).    . montelukast (SINGULAIR) 10 MG tablet Take 10 mg by mouth daily.    . Multiple Vitamin (MULTIVITAMIN WITH MINERALS) TABS tablet Take 1 tablet by mouth daily.    . Olopatadine HCl 0.6 % SOLN Place 1 spray into the nose daily.    . pravastatin (PRAVACHOL) 20 MG tablet Take 1 tablet (20 mg total) by mouth daily. 90 tablet 1  . PRESCRIPTION MEDICATION Zyrtec 10 mg taking daily    . PRESCRIPTION MEDICATION Patonase nasal spray 665 mg taking daily     No current facility-administered medications on file prior to visit.    No Known Allergies    Past Surgical History:  Procedure Laterality Date  . ABDOMINAL HYSTERECTOMY  01/22/1999   ovaries resected; DUB/fibroids  . 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 spleen removal     Family History  Problem Relation Age of Onset  . Cancer Mother 78       lung cancer  . Hypertension Mother   . Hyperlipidemia Mother   . Mental illness Mother   . Hypertension Sister   . Hypertension Brother   . Diabetes Brother   . Cancer Brother 57       bladder cancer; non-smoker  . Heart disease Brother 18       CABG/CAD  . Hypertension Sister   . Diabetes Sister   . Hypertension Brother   . Diabetes Brother   . Hypertension Brother   . Diabetes Brother   . Hypertension Brother   . COPD Father    Social History   Social History  . Marital status: Widowed    Spouse name: N/A  . Number of children: N/A  . Years of education: N/A   Occupational History  . Retired    Social History Main Topics  . Smoking status: Former Smoker    Packs/day: 0.50    Quit date: 12/03/2013  . Smokeless tobacco: Never Used  . Alcohol use No  . Drug use: No  . Sexual activity: No   Other Topics Concern  .  None   Social History Narrative   Marital status:  Widowed x since 2001 from [place]; married x 27 years.  Not dating really in 2017.      Children:  None; failed in vitro.      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      Tobacco: quit ; smoked 20 years.      Alcohol: none      Education: [employer].       Exercise: Yes; 3 times per week 2 hours deep water aerobics.      ADLs: independent ADLs; no assistant devices.      Advanced Directives:  None; FULL CODE; no prolonged measures.     Depression screen [facility] 2/9 10/08/2016 04/06/2016 03/26/2016 10/03/2015 03/28/2015  Decreased Interest 0 0 0 0 0  Down, Depressed, Hopeless 0 0 0 0 0  PHQ - 2 Score 0 0 0 0 0    Review of Systems  Constitutional: Positive for fever (low-grade).  HENT: Positive for  congestion and rhinorrhea.   Respiratory: Positive for cough.       Objective:   Physical Exam  Constitutional: She is oriented to person, place, and time. She appears well-developed and well-nourished. No distress.  HENT:  Head: Normocephalic and atraumatic.  Right Ear: Tympanic membrane is injected and retracted.  Left Ear: Tympanic membrane is injected and retracted.  Mouth/Throat: Posterior oropharyngeal erythema present.  Nares: narrowed Mouth/Throat: lots of postnasal drip  Eyes: Conjunctivae and EOM are normal.  Neck: Neck supple. No tracheal deviation present.  Cardiovascular: Normal rate, regular rhythm, S1 normal, S2 normal and normal heart sounds.   Pulmonary/Chest: Effort normal and breath sounds normal. No respiratory distress.  Musculoskeletal: Normal range of motion.  Lymphadenopathy:    She has no cervical adenopathy.  Neurological: She is alert and oriented to person, place, and time.  Skin: Skin is warm and dry.  Psychiatric: She has a normal mood and affect. Her behavior is normal.  Nursing note and vitals reviewed.  BP 122/72   Pulse 85   Temp 98.2 F (36.8 C) (Oral)   Resp 17   Ht 5' 6.5" (1.689 m)   Wt 190 lb (86.2 kg)   SpO2 98%   BMI 30.21 kg/m     Assessment & Plan:   1. Acute bronchitis, unspecified organism   Suspect viral - symptomatic care. Pt concerned about her immunosuppressed status since does not have a spleen so ok to Call for z-pak if worsening.   Meds ordered this encounter  Medications  . chlorpheniramine-HYDROcodone (TUSSIONEX PENNKINETIC ER) 10-8 MG/5ML SUER    Sig: Take 5 mLs by mouth every 12 (twelve) hours as needed.    Dispense:  120 mL    Refill:  0  . benzonatate (TESSALON) 200 MG capsule    Sig: Take 1 capsule (200 mg total) by mouth 3 (three) times daily as needed for cough.    Dispense:  40 capsule    Refill:  0    I personally performed the services described in this documentation, which was scribed in my  presence. The recorded information has been reviewed and considered, and addended by me as needed.   Delman Cheadle, M.D.  Primary Care at [facility] [address] [place], [place] [address] [phone] phone [phone] fax  10/19/16 12:03 PM

## 2016-10-16 NOTE — Patient Instructions (Addendum)
   IF you received an x-ray today, you will receive an invoice from [facility]. Please contact [facility] at [phone] with questions or concerns regarding your invoice.   IF you received labwork today, you will receive an invoice from [facility]. Please contact [facility] at [phone] with questions or concerns regarding your invoice.   Our billing staff will not be able to assist you with questions regarding bills from these companies.  You will be contacted with the lab results as soon as they are available. The fastest way to get your results is to activate your My Chart account. Instructions are located on the last page of this paperwork. If you have not heard from us regarding the results in 2 weeks, please contact this office.      Acute Bronchitis, Adult Acute bronchitis is sudden (acute) swelling of the air tubes (bronchi) in the lungs. Acute bronchitis causes these tubes to fill with mucus, which can make it hard to breathe. It can also cause coughing or wheezing. In adults, acute bronchitis usually goes away within 2 weeks. A cough caused by bronchitis may last up to 3 weeks. Smoking, allergies, and asthma can make the condition worse. Repeated episodes of bronchitis may cause further lung problems, such as chronic obstructive pulmonary disease (COPD). What are the causes? This condition can be caused by germs and by substances that irritate the lungs, including:  Cold and flu viruses. This condition is most often caused by the same virus that causes a cold.  Bacteria.  Exposure to tobacco smoke, dust, fumes, and air pollution.  What increases the risk? This condition is more likely to develop in people who:  Have close contact with someone with acute bronchitis.  Are exposed to lung irritants, such as tobacco smoke, dust, fumes, and vapors.  Have a weak immune system.  Have a respiratory condition such as asthma.  What are the signs or  symptoms? Symptoms of this condition include:  A cough.  Coughing up clear, yellow, or green mucus.  Wheezing.  Chest congestion.  Shortness of breath.  A fever.  Body aches.  Chills.  A sore throat.  How is this diagnosed? This condition is usually diagnosed with a physical exam. During the exam, your health care provider may order tests, such as chest X-rays, to rule out other conditions. He or she may also:  Test a sample of your mucus for bacterial infection.  Check the level of oxygen in your blood. This is done to check for pneumonia.  Do a chest X-ray or lung function testing to rule out pneumonia and other conditions.  Perform blood tests.  Your health care provider will also ask about your symptoms and medical history. How is this treated? Most cases of acute bronchitis clear up over time without treatment. Your health care provider may recommend:  Drinking more fluids. Drinking more makes your mucus thinner, which may make it easier to breathe.  Taking a medicine for a fever or cough.  Taking an antibiotic medicine.  Using an inhaler to help improve shortness of breath and to control a cough.  Using a cool mist vaporizer or humidifier to make it easier to breathe.  Follow these instructions at home: Medicines  Take over-the-counter and prescription medicines only as told by your health care provider.  If you were prescribed an antibiotic, take it as told by your health care provider. Do not stop taking the antibiotic even if you start to feel better. General instructions    Get plenty of rest.  Drink enough fluids to keep your urine clear or pale yellow.  Avoid smoking and secondhand smoke. Exposure to cigarette smoke or irritating chemicals will make bronchitis worse. If you smoke and you need help quitting, ask your health care provider. Quitting smoking will help your lungs heal faster.  Use an inhaler, cool mist vaporizer, or humidifier as told  by your health care provider.  Keep all follow-up visits as told by your health care provider. This is important. How is this prevented? To lower your risk of getting this condition again:  Wash your hands often with soap and water. If soap and water are not available, use hand sanitizer.  Avoid contact with people who have cold symptoms.  Try not to touch your hands to your mouth, nose, or eyes.  Make sure to get the flu shot every year.  Contact a health care provider if:  Your symptoms do not improve in 2 weeks of treatment. Get help right away if:  You cough up blood.  You have chest pain.  You have severe shortness of breath.  You become dehydrated.  You faint or keep feeling like you are going to faint.  You keep vomiting.  You have a severe headache.  Your fever or chills gets worse. This information is not intended to replace advice given to you by your health care provider. Make sure you discuss any questions you have with your health care provider. Document Released: 02/15/2004 Document Revised: 08/02/2015 Document Reviewed: 06/28/2015 Elsevier Interactive Patient Education  2017 Elsevier Inc.  

## 2016-11-20 ENCOUNTER — Other Ambulatory Visit: Payer: Self-pay | Admitting: Physician Assistant

## 2016-11-20 DIAGNOSIS — D2371 Other benign neoplasm of skin of right lower limb, including hip: Secondary | ICD-10-CM | POA: Diagnosis not present

## 2016-11-20 DIAGNOSIS — L430 Hypertrophic lichen planus: Secondary | ICD-10-CM | POA: Diagnosis not present

## 2016-11-20 DIAGNOSIS — L570 Actinic keratosis: Secondary | ICD-10-CM | POA: Diagnosis not present

## 2016-11-20 DIAGNOSIS — D492 Neoplasm of unspecified behavior of bone, soft tissue, and skin: Secondary | ICD-10-CM | POA: Diagnosis not present

## 2016-11-20 DIAGNOSIS — D229 Melanocytic nevi, unspecified: Secondary | ICD-10-CM | POA: Diagnosis not present

## 2016-12-16 DIAGNOSIS — M109 Gout, unspecified: Secondary | ICD-10-CM | POA: Diagnosis not present

## 2016-12-23 ENCOUNTER — Ambulatory Visit (INDEPENDENT_AMBULATORY_CARE_PROVIDER_SITE_OTHER): Payer: Medicare HMO | Admitting: Family Medicine

## 2016-12-23 ENCOUNTER — Ambulatory Visit (INDEPENDENT_AMBULATORY_CARE_PROVIDER_SITE_OTHER): Payer: Medicare HMO

## 2016-12-23 ENCOUNTER — Other Ambulatory Visit: Payer: Self-pay

## 2016-12-23 ENCOUNTER — Encounter: Payer: Self-pay | Admitting: Family Medicine

## 2016-12-23 VITALS — BP 126/72 | HR 90 | Temp 98.0°F | Resp 16 | Ht 65.75 in | Wt 188.0 lb

## 2016-12-23 DIAGNOSIS — M10071 Idiopathic gout, right ankle and foot: Secondary | ICD-10-CM | POA: Diagnosis not present

## 2016-12-23 DIAGNOSIS — M109 Gout, unspecified: Secondary | ICD-10-CM | POA: Insufficient documentation

## 2016-12-23 DIAGNOSIS — M7989 Other specified soft tissue disorders: Secondary | ICD-10-CM | POA: Diagnosis not present

## 2016-12-23 IMAGING — DX DG TOE GREAT 2+V*R*
3 series · 3 of 3 positions shown · non-contrast
Comparison: None in PACs

CLINICAL DATA: Right first metatarsophalangeal joint region pain
swelling and erythema.

EXAM:
RIGHT GREAT TOE

[toe ap]
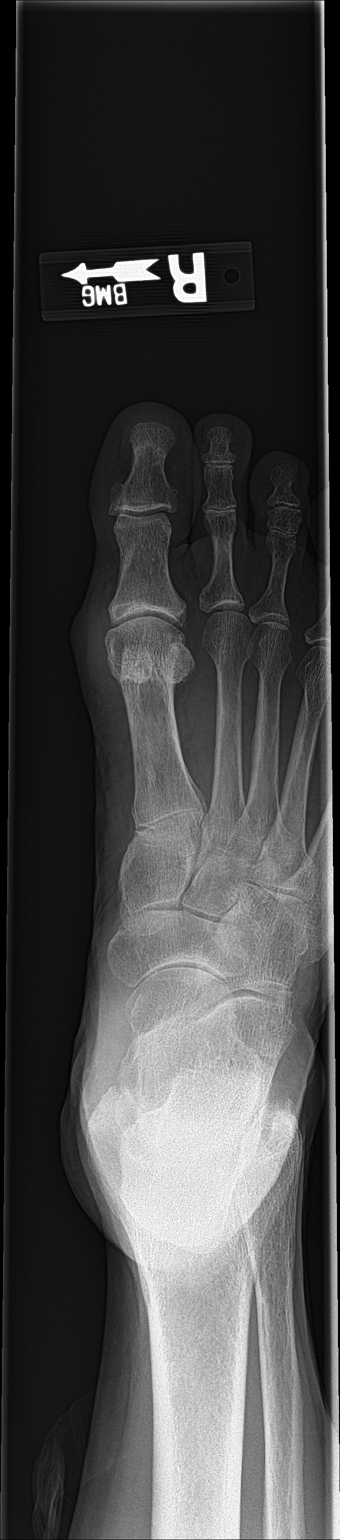

[toe obl]
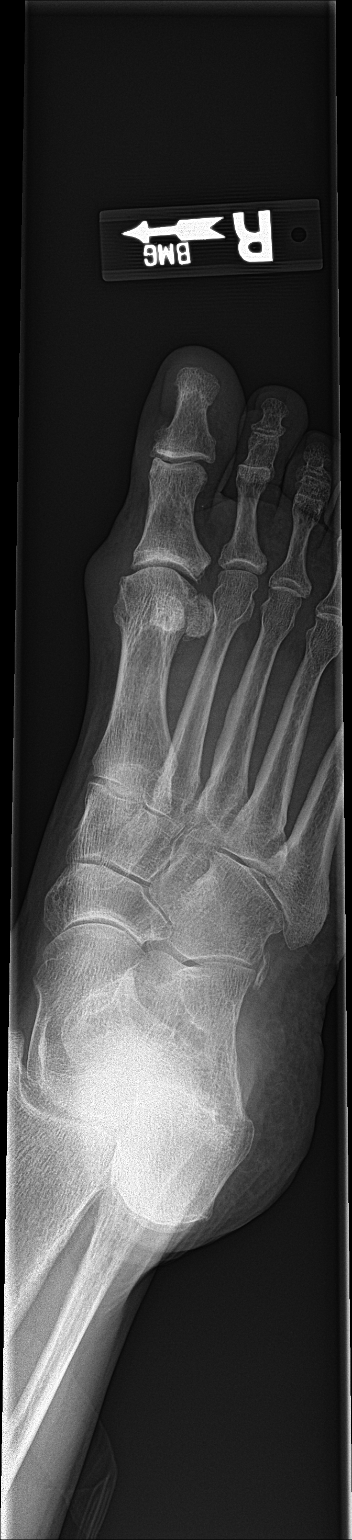

[toe lat]
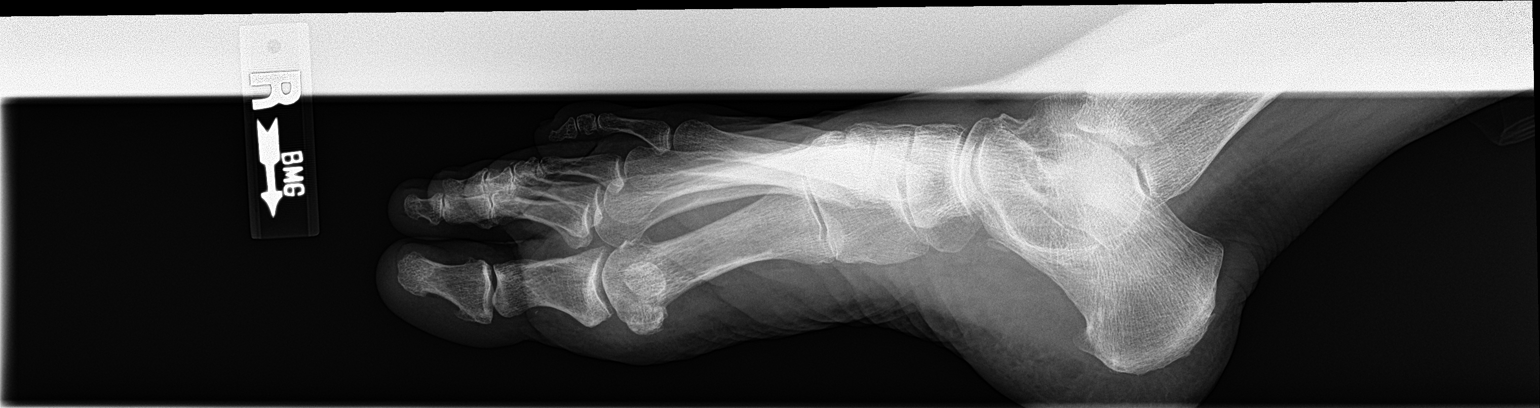

[3 of 3 positions shown; findings below may reference images not displayed]

FINDINGS: There is soft tissue swelling noted over the medial aspect of the
first MTP joint. The joint space is mildly narrowed. There is no
hallux valgus anatomy. No abnormal soft tissue calcification is
observed. The IP joint of the great toe is unremarkable.
IMPRESSION: Soft tissue swelling of the great toe overlying the medial aspect of
the first MTP joint. This could reflect gout in the appropriate
clinical setting. No acute bony abnormality. Mild symmetric joint
space narrowing of the first MTP joint may reflect underlying
osteoarthritis.

## 2016-12-23 MED ORDER — COLCHICINE 0.6 MG PO TABS: 0.6000 mg | ORAL_TABLET | Freq: Two times a day (BID) | ORAL | 2 refills | Status: DC | PRN

## 2016-12-23 MED ORDER — ALLOPURINOL 100 MG PO TABS: 100.0000 mg | ORAL_TABLET | Freq: Every day | ORAL | 1 refills | Status: DC

## 2016-12-23 NOTE — Progress Notes (Signed)
Subjective:    Patient ID: Sabrina Knight, female    DOB: 1950-08-22, 66 y.o.   MRN: [id number]  12/23/2016  Gout (since Thanksgiving on the right foot )    HPI This 66 y.o. female presents for evaluation of RIGHT foot pain, redness, swelling. [facility]uated by [facility] on December 16, 2016.  Diagnosed with gout.  Recommended follow-up with primary care physician for maintenance medication.  R first MTP joint with redness, swelling.  Injured R foot twenty years ago by falling.  On Thanksgiving, R first MTP joint started bothering patient; started hobbling around and pushed through it.  Then the following morning, went to [place] to visit to friends.  MTP was shiny bright and so painful; unable to bear weight; covers could not touch it.  Unable to sleep. Had to sleep with bed handing off; covers hurt; throbbing.  Set up in recliner; applied ice packs; Aleve.  No episode of this in the past.  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  Determined to get better; went to [facility] to swim and felt better.  Went to [facility] to get Aleve.  At [facility] and NP diagnosed immediately with gout.  Prescribed Prednisone 40mg  for five days.   Now has a bony prominence that was not there before.  One week since starting Prednisone.  Still slightly sore.  If restricts in tight shoes, still painful.   Has researched online.  Understands that uric acid can go to joints and kidney stones.   Hope that Allopurinol to prevent this again. Has never had pain like that in pt'slife.    Trying to take cholesterol medication.   BP Readings from Last 3 Encounters:  12/23/16 126/72  10/16/16 122/72  10/08/16 122/78   Wt Readings from Last 3 Encounters:  12/23/16 188 lb (85.3 kg)  10/16/16 190 lb (86.2 kg)  10/08/16 191 lb (86.6 kg)   Immunization History  Administered Date(s) Administered  . Influenza,inj,Quad PF,6+ Mos 10/08/2016  . Pneumococcal  Conjugate-13 10/08/2016  . Td 10/08/2016    Review of Systems  Constitutional: Negative for chills, diaphoresis, fatigue and fever.  Musculoskeletal: Positive for arthralgias, gait problem and joint swelling.  Skin: Positive for color change. Negative for pallor, rash and wound.  Neurological: Negative for weakness and numbness.    Past Medical History:  Diagnosis Date  . Allergy    Zyrtec, Singulair; allergy testing negative; Orvil Feil.  . Cataract   . Clotting disorder ([place])   . Glaucoma   . ITP (idiopathic thrombocytopenic purpura)    Past Surgical History:  Procedure Laterality Date  . ABDOMINAL HYSTERECTOMY  01/22/1999   ovaries resected; DUB/fibroids  . 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 spleen removal     No Known Allergies Current Outpatient Medications on File Prior to Visit  Medication Sig Dispense Refill  . carboxymethylcellulose (REFRESH PLUS) 0.5 % SOLN Apply 1 drop to eye 3 (three) times daily as needed (for dry eyes).    . montelukast (SINGULAIR) 10 MG tablet Take 10 mg by mouth daily.    . Multiple Vitamin (MULTIVITAMIN WITH MINERALS) TABS tablet Take 1 tablet by mouth daily.    . Olopatadine HCl 0.6 % SOLN Place 1 spray into the nose daily.    . pravastatin (PRAVACHOL) 20 MG tablet Take 1 tablet (20 mg total) by mouth daily. 90 tablet 1  . PRESCRIPTION MEDICATION Zyrtec 10 mg taking daily    .  PRESCRIPTION MEDICATION Patonase nasal spray 665 mg taking daily     No current facility-administered medications on file prior to visit.    Social History   Socioeconomic History  . Marital status: Widowed    Spouse name: Not on file  . Number of children: Not on file  . Years of education: Not on file  . Highest education level: Not on file  Social Needs  . Financial resource strain: Not on file  . Food insecurity - worry: Not on file  . Food insecurity - inability: Not on file  . Transportation needs - medical: Not on file  . Transportation needs -  non-medical: Not on file  Occupational History  . Occupation: Retired  Tobacco Use  . Smoking status: Former Smoker    Packs/day: 0.50    Last attempt to quit: 12/03/2013    Years since quitting: 3.0  . Smokeless tobacco: Never Used  Substance and Sexual Activity  . Alcohol use: No    Alcohol/week: 0.0 oz  . Drug use: No  . Sexual activity: No  Other Topics Concern  . Not on file  Social History Narrative   Marital status:  Widowed x since 2001 from [place]; married x 27 years.  Not dating really in 2017.      Children:  None; failed in vitro.      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      Tobacco: quit ; smoked 20 years.      Alcohol: none      Education: [employer].       Exercise: Yes; 3 times per week 2 hours deep water aerobics.      ADLs: independent ADLs; no assistant devices.      Advanced Directives:  None; FULL CODE; no prolonged measures.     Family History  Problem Relation Age of Onset  . Cancer Mother 6       lung cancer  . Hypertension Mother   . Hyperlipidemia Mother   . Mental illness Mother   . Hypertension Sister   . Hypertension Brother   . Diabetes Brother   . Cancer Brother 25       bladder cancer; non-smoker  . Heart disease Brother 76       CABG/CAD  . Hypertension Sister   . Diabetes Sister   . Hypertension Brother   . Diabetes Brother   . Hypertension Brother   . Diabetes Brother   . Hypertension Brother   . COPD Father        Objective:    BP 126/72   Pulse 90   Temp 98 F (36.7 C) (Oral)   Resp 16   Ht 5' 5.75" (1.67 m)   Wt 188 lb (85.3 kg)   SpO2 96%   BMI 30.58 kg/m  Physical Exam  Constitutional: She is oriented to person, place, and time. She appears well-developed and well-nourished. No distress.  HENT:  Head: Normocephalic and atraumatic.  Eyes: Conjunctivae are normal. Pupils are equal, round, and reactive to light.  Neck: Normal range of motion. Neck supple.    Cardiovascular: Intact distal pulses.  Pulmonary/Chest: Effort normal.  Musculoskeletal:       Right ankle: Normal.       Right foot: There is tenderness, bony tenderness and swelling. There is normal range of motion, normal capillary refill, no crepitus, no deformity and no laceration.       Feet:  +  TTP R first MTP with erythema and localized swelling and slight warmth; full ROM of R first MTP.  Neurological: She is alert and oriented to person, place, and time.  Skin: Skin is warm and dry. She is not diaphoretic. There is erythema.  Psychiatric: She has a normal mood and affect. Her behavior is normal.  Nursing note and vitals reviewed.  No results found. Depression screen [facility] 2/9 12/23/2016 10/08/2016 04/06/2016 03/26/2016 10/03/2015  Decreased Interest 0 0 0 0 0  Down, Depressed, Hopeless 0 0 0 0 0  PHQ - 2 Score 0 0 0 0 0   Fall Risk  12/23/2016 10/08/2016 04/06/2016 03/26/2016 10/03/2015  Falls in the past year? No No No No No        Assessment & Plan:   1. Acute idiopathic gout involving toe of right foot    New onset acute gouty attack.  Education counseling provided during visit. Obtain labs including CBC, CMAC, uric acid level. Obtain right great toe x-ray to evaluate further. Patient requesting maintenance medication.  Allopurinol 100 mg daily provided.  Advised patient that gout can acutely worsen with initiation of allopurinol use.  Recommend using Aleve twice daily if pain acutely worsens with initiating allopurinol.  Also provided patient with a prescription for colchicine to use for acute pain or gout.  Advised patient use 1 tablet 3 times daily for 5 days with each exacerbation of gout. Dietary recommendations reviewed in detail with patient.  Orders Placed This Encounter  Procedures  . DG Toe Great Right    Standing Status:   Future    Number of Occurrences:   1    Standing Expiration Date:   12/23/2017    Order Specific Question:   Reason for Exam (SYMPTOM  OR  DIAGNOSIS REQUIRED)    Answer:   R MTP swelling, redness, pain; no fever    Order Specific Question:   Preferred imaging location?    Answer:   External  . CBC with Differential/Platelet  . Comprehensive metabolic panel  . Uric acid   Meds ordered this encounter  Medications  . allopurinol (ZYLOPRIM) 100 MG tablet    Sig: Take 1 tablet (100 mg total) by mouth daily.    Dispense:  90 tablet    Refill:  1  . colchicine 0.6 MG tablet    Sig: Take 1 tablet (0.6 mg total) by mouth 2 (two) times daily as needed.    Dispense:  30 tablet    Refill:  2    Return in about 3 months (around 03/23/2017) for follow-up chronic medical conditions.   Anthany Thornhill Elayne Guerin, M.D. Primary Care at [facility] [address] [place], [place]  [address] [phone] phone [phone] fax

## 2016-12-23 NOTE — Patient Instructions (Addendum)
   IF you received an x-ray today, you will receive an invoice from [facility]. Please contact [facility] at [phone] with questions or concerns regarding your invoice.   IF you received labwork today, you will receive an invoice from [facility]. Please contact [facility] at [phone] with questions or concerns regarding your invoice.   Our billing staff will not be able to assist you with questions regarding bills from these companies.  You will be contacted with the lab results as soon as they are available. The fastest way to get your results is to activate your My Chart account. Instructions are located on the last page of this paperwork. If you have not heard from us regarding the results in 2 weeks, please contact this office.     Gout Gout is painful swelling that can occur in some of your joints. Gout is a type of arthritis. This condition is caused by having too much uric acid in your body. Uric acid is a chemical that forms when your body breaks down substances called purines. Purines are important for building body proteins. When your body has too much uric acid, sharp crystals can form and build up inside your joints. This causes pain and swelling. Gout attacks can happen quickly and be very painful (acute gout). Over time, the attacks can affect more joints and become more frequent (chronic gout). Gout can also cause uric acid to build up under your skin and inside your kidneys. What are the causes? This condition is caused by too much uric acid in your blood. This can occur because:  Your kidneys do not remove enough uric acid from your blood. This is the most common cause.  Your body makes too much uric acid. This can occur with some cancers and cancer treatments. It can also occur if your body is breaking down too many red blood cells (hemolytic anemia).  You eat too many foods that are high in purines. These foods include organ meats and some seafood.  Alcohol, especially beer, is also high in purines.  A gout attack may be triggered by trauma or stress. What increases the risk? This condition is more likely to develop in people who:  Have a family history of gout.  Are female and middle-aged.  Are female and have gone through menopause.  Are obese.  Frequently drink alcohol, especially beer.  Are dehydrated.  Lose weight too quickly.  Have an organ transplant.  Have lead poisoning.  Take certain medicines, including aspirin, cyclosporine, diuretics, levodopa, and niacin.  Have kidney disease or psoriasis.  What are the signs or symptoms? An attack of acute gout happens quickly. It usually occurs in just one joint. The most common place is the big toe. Attacks often start at night. Other joints that may be affected include joints of the feet, ankle, knee, fingers, wrist, or elbow. Symptoms may include:  Severe pain.  Warmth.  Swelling.  Stiffness.  Tenderness. The affected joint may be very painful to touch.  Shiny, red, or purple skin.  Chills and fever.  Chronic gout may cause symptoms more frequently. More joints may be involved. You may also have white or yellow lumps (tophi) on your hands or feet or in other areas near your joints. How is this diagnosed? This condition is diagnosed based on your symptoms, medical history, and physical exam. You may have tests, such as:  Blood tests to measure uric acid levels.  Removal of joint fluid with a needle (aspiration) to   to look for uric acid crystals.  X-rays to look for joint damage.  How is this treated? Treatment for this condition has two phases: treating an acute attack and preventing future attacks. Acute gout treatment may include medicines to reduce pain and swelling, including:  NSAIDs.  Steroids. These are strong anti-inflammatory medicines that can be taken by mouth (orally) or injected into a joint.  Colchicine. This medicine relieves pain  and swelling when it is taken soon after an attack. It can be given orally or through an IV tube.  Preventive treatment may include:  Daily use of smaller doses of NSAIDs or colchicine.  Use of a medicine that reduces uric acid levels in your blood.  Changes to your diet. You may need to see a specialist about healthy eating (dietitian).  Follow these instructions at home: During a Gout Attack  If directed, apply ice to the affected area: ? Put ice in a plastic bag. ? Place a towel between your skin and the bag. ? Leave the ice on for 20 minutes, 2-3 times a day.  Rest the joint as much as possible. If the affected joint is in your leg, you may be given crutches to use.  Raise (elevate) the affected joint above the level of your heart as often as possible.  Drink enough fluids to keep your urine clear or pale yellow.  Take over-the-counter and prescription medicines only as told by your health care provider.  Do not drive or operate heavy machinery while taking prescription pain medicine.  Follow instructions from your health care provider about eating or drinking restrictions.  Return to your normal activities as told by your health care provider. Ask your health care provider what activities are safe for you. Avoiding Future Gout Attacks  Follow a low-purine diet as told by your dietitian or health care provider. Avoid foods and drinks that are high in purines, including liver, kidney, anchovies, asparagus, herring, mushrooms, mussels, and beer.  Limit alcohol intake to no more than 1 drink a day for nonpregnant women and 2 drinks a day for men. One drink equals 12 oz of beer, 5 oz of wine, or 1 oz of hard liquor.  Maintain a healthy weight or lose weight if you are overweight. If you want to lose weight, talk with your health care provider. It is important that you do not lose weight too quickly.  Start or maintain an exercise program as told by your health care  provider.  Drink enough fluids to keep your urine clear or pale yellow.  Take over-the-counter and prescription medicines only as told by your health care provider.  Keep all follow-up visits as told by your health care provider. This is important. Contact a health care provider if:  You have another gout attack.  You continue to have symptoms of a gout attack after10 days of treatment.  You have side effects from your medicines.  You have chills or a fever.  You have burning pain when you urinate.  You have pain in your lower back or belly. Get help right away if:  You have severe or uncontrolled pain.  You cannot urinate. This information is not intended to replace advice given to you by your health care provider. Make sure you discuss any questions you have with your health care provider. Document Released: 01/05/2000 Document Revised: 06/15/2015 Document Reviewed: 10/20/2014 Elsevier Interactive Patient Education  2017 [employer].

## 2016-12-24 LAB — CBC WITH DIFFERENTIAL/PLATELET
BASOS: 1 %
Basophils Absolute: 0.1 10*3/uL (ref 0.0–0.2)
EOS (ABSOLUTE): 0.2 10*3/uL (ref 0.0–0.4)
EOS: 2 %
HEMATOCRIT: 44.7 % (ref 34.0–46.6)
HEMOGLOBIN: 14.7 g/dL (ref 11.1–15.9)
Immature Grans (Abs): 0 10*3/uL (ref 0.0–0.1)
Immature Granulocytes: 0 %
LYMPHS ABS: 4.5 10*3/uL — AB (ref 0.7–3.1)
Lymphs: 42 %
MCH: 30.2 pg (ref 26.6–33.0)
MCHC: 32.9 g/dL (ref 31.5–35.7)
MCV: 92 fL (ref 79–97)
MONOCYTES: 10 %
Monocytes Absolute: 1 10*3/uL — ABNORMAL HIGH (ref 0.1–0.9)
NEUTROS ABS: 4.9 10*3/uL (ref 1.4–7.0)
Neutrophils: 45 %
Platelets: 541 10*3/uL — ABNORMAL HIGH (ref 150–379)
RBC: 4.86 x10E6/uL (ref 3.77–5.28)
RDW: 14.1 % (ref 12.3–15.4)
WBC: 10.7 10*3/uL (ref 3.4–10.8)

## 2016-12-24 LAB — COMPREHENSIVE METABOLIC PANEL
ALBUMIN: 4 g/dL (ref 3.6–4.8)
ALK PHOS: 101 IU/L (ref 39–117)
ALT: 35 IU/L — ABNORMAL HIGH (ref 0–32)
AST: 19 IU/L (ref 0–40)
Albumin/Globulin Ratio: 1.3 (ref 1.2–2.2)
BILIRUBIN TOTAL: 0.3 mg/dL (ref 0.0–1.2)
BUN / CREAT RATIO: 17 (ref 12–28)
BUN: 16 mg/dL (ref 8–27)
CHLORIDE: 106 mmol/L (ref 96–106)
CO2: 22 mmol/L (ref 20–29)
Calcium: 9.5 mg/dL (ref 8.7–10.3)
Creatinine, Ser: 0.93 mg/dL (ref 0.57–1.00)
GFR calc non Af Amer: 64 mL/min/{1.73_m2} (ref >59)
GFR, EST AFRICAN AMERICAN: 74 mL/min/{1.73_m2} (ref >59)
GLOBULIN, TOTAL: 3 g/dL (ref 1.5–4.5)
Glucose: 101 mg/dL — ABNORMAL HIGH (ref 65–99)
Potassium: 4.7 mmol/L (ref 3.5–5.2)
SODIUM: 145 mmol/L — AB (ref 134–144)
Total Protein: 7 g/dL (ref 6.0–8.5)

## 2016-12-24 LAB — URIC ACID: URIC ACID: 8.5 mg/dL — AB (ref 2.5–7.1)

## 2017-02-03 ENCOUNTER — Other Ambulatory Visit: Payer: Self-pay | Admitting: Family Medicine

## 2017-02-21 ENCOUNTER — Encounter: Payer: Self-pay | Admitting: Family Medicine

## 2017-02-26 DIAGNOSIS — Z86010 Personal history of colonic polyps: Secondary | ICD-10-CM | POA: Diagnosis not present

## 2017-02-26 DIAGNOSIS — K635 Polyp of colon: Secondary | ICD-10-CM | POA: Diagnosis not present

## 2017-02-28 ENCOUNTER — Ambulatory Visit (INDEPENDENT_AMBULATORY_CARE_PROVIDER_SITE_OTHER): Payer: Medicare HMO | Admitting: Family Medicine

## 2017-02-28 ENCOUNTER — Other Ambulatory Visit: Payer: Self-pay

## 2017-02-28 ENCOUNTER — Encounter: Payer: Self-pay | Admitting: Family Medicine

## 2017-02-28 VITALS — BP 142/86 | HR 81 | Temp 98.3°F | Resp 16 | Ht 65.75 in | Wt 187.0 lb

## 2017-02-28 DIAGNOSIS — R0981 Nasal congestion: Secondary | ICD-10-CM

## 2017-02-28 DIAGNOSIS — J310 Chronic rhinitis: Secondary | ICD-10-CM | POA: Diagnosis not present

## 2017-02-28 LAB — POCT INFLUENZA A/B
Influenza A, POC: NEGATIVE
Influenza B, POC: NEGATIVE

## 2017-02-28 MED ORDER — FLUTICASONE PROPIONATE 50 MCG/ACT NA SUSP: 1.0000 | Freq: Two times a day (BID) | NASAL | 0 refills | Status: DC

## 2017-02-28 NOTE — Progress Notes (Signed)
2/8/20199:38 AM  Sabrina Knight 1950/04/04, 67 y.o. female [id number]  Chief Complaint  Patient presents with  . Nasal Congestion    X 2 days  . Cough    HPI:   Patient is a 67 y.o. female with past medical history significant for splenectomy for ITP and non allergic rhinitis who presents today for 2 days of nasal congestion, post nasal drip, scratchy throat, minimal cough, not getting better with mucinex, nasal decongestant, zyrtec and singulair. Her friend has recently been diagnosed with the flu and another friend had a ruptured eardrum from an ear infection. She denies any fever, chills, body aches, headaches, sinus pain, ear pain, nausea, vomiting or diarrhea. She does endorse itchy ears.  Depression screen [facility] 2/9 02/28/2017 12/23/2016 10/08/2016  Decreased Interest 0 0 0  Down, Depressed, Hopeless 0 0 0  PHQ - 2 Score 0 0 0    No Known Allergies  Prior to Admission medications   Medication Sig Start Date End Date Taking? Authorizing Provider  allopurinol (ZYLOPRIM) 100 MG tablet Take 1 tablet (100 mg total) by mouth daily. 12/23/16  Yes Wardell Honour, MD  carboxymethylcellulose (REFRESH PLUS) 0.5 % SOLN Apply 1 drop to eye 3 (three) times daily as needed (for dry eyes).   Yes [provider]  colchicine 0.6 MG tablet TAKE 1 TABLET (0.6 MG TOTAL) BY MOUTH 2 (TWO) TIMES DAILY AS NEEDED. 02/03/17  Yes Wardell Honour, MD  montelukast (SINGULAIR) 10 MG tablet Take 10 mg by mouth daily. 01/07/13  Yes [provider]  Multiple Vitamin (MULTIVITAMIN WITH MINERALS) TABS tablet Take 1 tablet by mouth daily.   Yes [provider]  Olopatadine HCl 0.6 % SOLN Place 1 spray into the nose daily. 01/07/13  Yes [provider]  pravastatin (PRAVACHOL) 20 MG tablet Take 1 tablet (20 mg total) by mouth daily. 10/08/16  Yes Wardell Honour, MD  PRESCRIPTION MEDICATION Zyrtec 10 mg taking daily   Yes [provider]  PRESCRIPTION MEDICATION Patonase  nasal spray 665 mg taking daily   Yes [provider]    Past Medical History:  Diagnosis Date  . Allergy    Zyrtec, Singulair; allergy testing negative; [facility].  . Cataract   . Clotting disorder ([place])   . Glaucoma   . ITP (idiopathic thrombocytopenic purpura)     Past Surgical History:  Procedure Laterality Date  . ABDOMINAL HYSTERECTOMY  01/22/1999   ovaries resected; DUB/fibroids  . 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 spleen removal      Social History   Tobacco Use  . Smoking status: Former Smoker    Packs/day: 0.50    Last attempt to quit: 12/03/2013    Years since quitting: 3.2  . Smokeless tobacco: Never Used  Substance Use Topics  . Alcohol use: No    Alcohol/week: 0.0 oz    Family History  Problem Relation Age of Onset  . Cancer Mother 39       lung cancer  . Hypertension Mother   . Hyperlipidemia Mother   . Mental illness Mother   . Hypertension Sister   . Hypertension Brother   . Diabetes Brother   . Cancer Brother 33       bladder cancer; non-smoker  . Heart disease Brother 65       CABG/CAD  . Hypertension Sister   . Diabetes Sister   . Hypertension Brother   . Diabetes Brother   . Hypertension  Brother   . Diabetes Brother   . Hypertension Brother   . COPD Father     ROS Per hpi  OBJECTIVE:  Blood pressure (!) 142/86, pulse 81, temperature 98.3 F (36.8 C), temperature source Oral, resp. rate 16, height 5' 5.75" (1.67 m), weight 187 lb (84.8 kg), SpO2 93 %.  Physical Exam  Constitutional: She is oriented to person, place, and time and well-developed, well-nourished, and in no distress.  HENT:  Head: Normocephalic and atraumatic.  Right Ear: Hearing, tympanic membrane, external ear and ear canal normal.  Left Ear: Hearing, tympanic membrane, external ear and ear canal normal.  Mouth/Throat: Oropharynx is clear and moist.  Eyes: EOM are normal. Pupils are equal, round, and reactive to light.  Neck: Neck supple.    Cardiovascular: Normal rate, regular rhythm and normal heart sounds. Exam reveals no gallop and no friction rub.  No murmur heard. Pulmonary/Chest: Effort normal and breath sounds normal. She has no wheezes. She has no rales.  Lymphadenopathy:    She has no cervical adenopathy.  Neurological: She is alert and oriented to person, place, and time. Gait normal.  Skin: Skin is warm and dry.      Results for orders placed or performed in visit on 02/28/17 (from the past 24 hour(s))  POCT Influenza A/B     Status: None   Collection Time: 02/28/17  9:39 AM  Result Value Ref Range   Influenza A, POC Negative Negative   Influenza B, POC Negative Negative    ASSESSMENT and PLAN  1. Nasal congestion Discussed supportive measures, new meds r/se/b and RTC precautions. - POCT Influenza A/B  2. Non-allergic rhinitis  Other orders - fluticasone (FLONASE) 50 MCG/ACT nasal spray; Place 1 spray into both nostrils 2 (two) times daily.  Return if symptoms worsen or fail to improve.    Rutherford Guys, MD [facility] at [place], [place] [address] Ph.  [phone] Fax [phone]

## 2017-02-28 NOTE — Patient Instructions (Addendum)
1. Add an oral decongestant such as phenylephrine, take 1 tab every 4 hours as needed 2. Start nasal saline washes twice a day 3. Start flonase 1 spray twice a day, use after saline nasal wash     IF you received an x-ray today, you will receive an invoice from [facility]. Please contact [facility] at [phone] with questions or concerns regarding your invoice.   IF you received labwork today, you will receive an invoice from [facility]. Please contact [facility] at [phone] with questions or concerns regarding your invoice.   Our billing staff will not be able to assist you with questions regarding bills from these companies.  You will be contacted with the lab results as soon as they are available. The fastest way to get your results is to activate your My Chart account. Instructions are located on the last page of this paperwork. If you have not heard from [place] regarding the results in 2 weeks, please contact this office.

## 2017-03-04 DIAGNOSIS — K635 Polyp of colon: Secondary | ICD-10-CM | POA: Diagnosis not present

## 2017-03-27 ENCOUNTER — Other Ambulatory Visit: Payer: Self-pay | Admitting: Family Medicine

## 2017-04-07 ENCOUNTER — Encounter: Payer: Self-pay | Admitting: Family Medicine

## 2017-04-07 ENCOUNTER — Other Ambulatory Visit: Payer: Self-pay

## 2017-04-07 ENCOUNTER — Ambulatory Visit (INDEPENDENT_AMBULATORY_CARE_PROVIDER_SITE_OTHER): Payer: Medicare HMO | Admitting: Family Medicine

## 2017-04-07 VITALS — BP 138/78 | HR 76 | Temp 98.0°F | Resp 16 | Ht 65.95 in | Wt 187.0 lb

## 2017-04-07 DIAGNOSIS — M1A0710 Idiopathic chronic gout, right ankle and foot, without tophus (tophi): Secondary | ICD-10-CM

## 2017-04-07 DIAGNOSIS — J310 Chronic rhinitis: Secondary | ICD-10-CM | POA: Diagnosis not present

## 2017-04-07 DIAGNOSIS — E6609 Other obesity due to excess calories: Secondary | ICD-10-CM

## 2017-04-07 DIAGNOSIS — D473 Essential (hemorrhagic) thrombocythemia: Secondary | ICD-10-CM

## 2017-04-07 DIAGNOSIS — M85852 Other specified disorders of bone density and structure, left thigh: Secondary | ICD-10-CM | POA: Diagnosis not present

## 2017-04-07 DIAGNOSIS — Z124 Encounter for screening for malignant neoplasm of cervix: Secondary | ICD-10-CM | POA: Diagnosis not present

## 2017-04-07 DIAGNOSIS — Z9081 Acquired absence of spleen: Secondary | ICD-10-CM

## 2017-04-07 DIAGNOSIS — E7800 Pure hypercholesterolemia, unspecified: Secondary | ICD-10-CM

## 2017-04-07 DIAGNOSIS — Z6831 Body mass index (BMI) 31.0-31.9, adult: Secondary | ICD-10-CM

## 2017-04-07 DIAGNOSIS — R739 Hyperglycemia, unspecified: Secondary | ICD-10-CM | POA: Diagnosis not present

## 2017-04-07 DIAGNOSIS — D75839 Thrombocytosis, unspecified: Secondary | ICD-10-CM

## 2017-04-07 DIAGNOSIS — Z0000 Encounter for general adult medical examination without abnormal findings: Secondary | ICD-10-CM | POA: Diagnosis not present

## 2017-04-07 DIAGNOSIS — M85851 Other specified disorders of bone density and structure, right thigh: Secondary | ICD-10-CM | POA: Diagnosis not present

## 2017-04-07 LAB — POCT URINALYSIS DIP (MANUAL ENTRY)
Bilirubin, UA: NEGATIVE
Blood, UA: NEGATIVE
GLUCOSE UA: NEGATIVE mg/dL
Ketones, POC UA: NEGATIVE mg/dL
Leukocytes, UA: NEGATIVE
NITRITE UA: NEGATIVE
Protein Ur, POC: NEGATIVE mg/dL
Spec Grav, UA: <=1.005 — AB (ref 1.010–1.025)
UROBILINOGEN UA: 0.2 U/dL
pH, UA: 5.5 (ref 5.0–8.0)

## 2017-04-07 MED ORDER — ALLOPURINOL 100 MG PO TABS: 100.0000 mg | ORAL_TABLET | Freq: Every day | ORAL | 3 refills | Status: DC

## 2017-04-07 MED ORDER — FLUTICASONE PROPIONATE 50 MCG/ACT NA SUSP: 1.0000 | Freq: Two times a day (BID) | NASAL | 3 refills | Status: DC

## 2017-04-07 NOTE — Patient Instructions (Addendum)
   IF you received an x-ray today, you will receive an invoice from [facility]. Please contact [facility] at [phone] with questions or concerns regarding your invoice.   IF you received labwork today, you will receive an invoice from [facility]. Please contact [facility] at [phone] with questions or concerns regarding your invoice.   Our billing staff will not be able to assist you with questions regarding bills from these companies.  You will be contacted with the lab results as soon as they are available. The fastest way to get your results is to activate your My Chart account. Instructions are located on the last page of this paperwork. If you have not heard from us regarding the results in 2 weeks, please contact this office.      Preventive Care 65 Years and Older, Female Preventive care refers to lifestyle choices and visits with your health care provider that can promote health and wellness. What does preventive care include?  A yearly physical exam. This is also called an annual well check.  Dental exams once or twice a year.  Routine eye exams. Ask your health care provider how often you should have your eyes checked.  Personal lifestyle choices, including: ? Daily care of your teeth and gums. ? Regular physical activity. ? Eating a healthy diet. ? Avoiding tobacco and drug use. ? Limiting alcohol use. ? Practicing safe sex. ? Taking low-dose aspirin every day. ? Taking vitamin and mineral supplements as recommended by your health care provider. What happens during an annual well check? The services and screenings done by your health care provider during your annual well check will depend on your age, overall health, lifestyle risk factors, and family history of disease. Counseling Your health care provider may ask you questions about your:  Alcohol use.  Tobacco use.  Drug use.  Emotional well-being.  Home and relationship  well-being.  Sexual activity.  Eating habits.  History of falls.  Memory and ability to understand (cognition).  Work and work environment.  Reproductive health.  Screening You may have the following tests or measurements:  Height, weight, and BMI.  Blood pressure.  Lipid and cholesterol levels. These may be checked every 5 years, or more frequently if you are over 50 years old.  Skin check.  Lung cancer screening. You may have this screening every year starting at age 55 if you have a 30-pack-year history of smoking and currently smoke or have quit within the past 15 years.  Fecal occult blood test (FOBT) of the stool. You may have this test every year starting at age 50.  Flexible sigmoidoscopy or colonoscopy. You may have a sigmoidoscopy every 5 years or a colonoscopy every 10 years starting at age 50.  Hepatitis C blood test.  Hepatitis B blood test.  Sexually transmitted disease (STD) testing.  Diabetes screening. This is done by checking your blood sugar (glucose) after you have not eaten for a while (fasting). You may have this done every 1-3 years.  Bone density scan. This is done to screen for osteoporosis. You may have this done starting at age 65.  Mammogram. This may be done every 1-2 years. Talk to your health care provider about how often you should have regular mammograms.  Talk with your health care provider about your test results, treatment options, and if necessary, the need for more tests. Vaccines Your health care provider may recommend certain vaccines, such as:  Influenza vaccine. This is recommended every year.    Tetanus, diphtheria, and acellular pertussis (Tdap, Td) vaccine. You may need a Td booster every 10 years.  Varicella vaccine. You may need this if you have not been vaccinated.  Zoster vaccine. You may need this after age 60.  Measles, mumps, and rubella (MMR) vaccine. You may need at least one dose of MMR if you were born in  1957 or later. You may also need a second dose.  Pneumococcal 13-valent conjugate (PCV13) vaccine. One dose is recommended after age 65.  Pneumococcal polysaccharide (PPSV23) vaccine. One dose is recommended after age 65.  Meningococcal vaccine. You may need this if you have certain conditions.  Hepatitis A vaccine. You may need this if you have certain conditions or if you travel or work in places where you may be exposed to hepatitis A.  Hepatitis B vaccine. You may need this if you have certain conditions or if you travel or work in places where you may be exposed to hepatitis B.  Haemophilus influenzae type b (Hib) vaccine. You may need this if you have certain conditions.  Talk to your health care provider about which screenings and vaccines you need and how often you need them. This information is not intended to replace advice given to you by your health care provider. Make sure you discuss any questions you have with your health care provider. Document Released: 02/03/2015 Document Revised: 09/27/2015 Document Reviewed: 11/08/2014 Elsevier Interactive Patient Education  2018 [employer]  

## 2017-04-07 NOTE — Progress Notes (Signed)
Subjective:    Patient ID: Sabrina Knight, female    DOB: [birth year], 67 y.o.   MRN: [id number]  04/07/2017  Medicare Wellness; Annual Exam; Hyperlipidemia; and Allergic Rhinitis     HPI This 67 y.o. female presents for [place] and follow-up of chronic medical condition.  Last physical:  04-06-2016 Pap smear:  hysterectomy Mammogram:   09-2015 Colonoscopy:  2013 Bone density:  [id number]    Visual Acuity Screening   Right eye Left eye Both eyes  Without correction: 20/30 20/30 20/30   With correction:       BP Readings from Last 3 Encounters:  04/07/17 138/78  02/28/17 (!) 142/86  12/23/16 126/72   Wt Readings from Last 3 Encounters:  04/07/17 187 lb (84.8 kg)  02/28/17 187 lb (84.8 kg)  12/23/16 188 lb (85.3 kg)   Immunization History  Administered Date(s) Administered  . Influenza,inj,Quad PF,6+ Mos 10/08/2016  . Pneumococcal Conjugate-13 10/08/2016  . Td 10/08/2016  . Zoster 06/29/2010   Health Maintenance  Topic Date Due  . PNA vac Low Risk Adult (2 of 2 - PPSV23) 10/08/2017  . MAMMOGRAM  10/15/2017  . COLONOSCOPY  07/31/2021  . TETANUS/TDAP  10/09/2026  . INFLUENZA VACCINE  Completed  . DEXA SCAN  Completed  . Hepatitis C Screening  Completed   Allergic Rhinitis: chronic and uncontrolled.  S/p splenectomy; grew into allergies.  Never goes away; 1996 splenectomy.  Prolonged illnesses for six weeks.  Sees allergist once per yeare; singulair, zyrtec, flonase, albuterol.    Hypercholesterolemia: Patient reports POOR compliance with medication, good tolerance to medication, and good symptom control.  Not taking statin therapy; taking chocoloate dark with plant sterols.   Gout: allopurinol started at last visit.  Loves it.      Review of Systems  Constitutional: Negative for activity change, appetite change, chills, diaphoresis, fatigue, fever and unexpected weight change.  HENT: Negative for congestion, dental problem, drooling, ear  discharge, ear pain, facial swelling, hearing loss, mouth sores, nosebleeds, postnasal drip, rhinorrhea, sinus pressure, sneezing, sore throat, tinnitus, trouble swallowing and voice change.   Eyes: Positive for itching. Negative for photophobia, pain, discharge, redness and visual disturbance.  Respiratory: Negative for apnea, cough, choking, chest tightness, shortness of breath, wheezing and stridor.   Cardiovascular: Negative for chest pain, palpitations and leg swelling.  Gastrointestinal: Negative for abdominal distention, abdominal pain, anal bleeding, blood in stool, constipation, diarrhea, nausea, rectal pain and vomiting.  Endocrine: Negative for cold intolerance, heat intolerance, polydipsia, polyphagia and polyuria.  Genitourinary: Negative for decreased urine volume, difficulty urinating, dyspareunia, dysuria, enuresis, flank pain, frequency, genital sores, hematuria, menstrual problem, pelvic pain, urgency, vaginal bleeding, vaginal discharge and vaginal pain.       Nocturia x 0-1.  No urinary leakage.  Musculoskeletal: Negative for arthralgias, back pain, gait problem, joint swelling, myalgias, neck pain and neck stiffness.  Skin: Negative for color change, pallor, rash and wound.  Allergic/Immunologic: Positive for environmental allergies. Negative for food allergies and immunocompromised state.  Neurological: Negative for dizziness, tremors, seizures, syncope, facial asymmetry, speech difficulty, weakness, light-headedness, numbness and headaches.  Hematological: Negative for adenopathy. Does not bruise/bleed easily.  Psychiatric/Behavioral: Negative for agitation, behavioral problems, confusion, decreased concentration, dysphoric mood, hallucinations, self-injury, sleep disturbance and suicidal ideas. The patient is not nervous/anxious and is not hyperactive.     Past Medical History:  Diagnosis Date  . Allergy    Zyrtec, Singulair; allergy testing negative; Orvil Feil.  . Cataract     . Clotting  disorder ([place])   . Glaucoma   . ITP (idiopathic thrombocytopenic purpura)    Past Surgical History:  Procedure Laterality Date  . ABDOMINAL HYSTERECTOMY  01/22/1999   ovaries resected; DUB/fibroids  . 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 spleen removal     No Known Allergies Current Outpatient Medications on File Prior to Visit  Medication Sig Dispense Refill  . brimonidine (ALPHAGAN) 0.2 % ophthalmic solution 1 DROP INTO RIGHT EYE TWICE A DAY START 48 HOURS PRIOR TO SURGERY    . carboxymethylcellulose (REFRESH PLUS) 0.5 % SOLN Apply 1 drop to eye 3 (three) times daily as needed (for dry eyes).    . colchicine 0.6 MG tablet TAKE 1 TABLET (0.6 MG TOTAL) BY MOUTH 2 (TWO) TIMES DAILY AS NEEDED. 30 tablet 2  . fexofenadine (ALLEGRA) 180 MG tablet TAKE 1 TABLET BY MOUTH EVERY DAY    . Fluticasone Furoate (ARNUITY ELLIPTA) 100 MCG/ACT AEPB INHALE ONCE A DAY    . GAVILYTE-N WITH FLAVOR PACK 420 g solution See admin instructions.  0  . montelukast (SINGULAIR) 10 MG tablet Take 10 mg by mouth daily.    . Multiple Vitamin (MULTIVITAMIN WITH MINERALS) TABS tablet Take 1 tablet by mouth daily.    . Olopatadine HCl 0.6 % SOLN Place 1 spray into the nose daily.    . pravastatin (PRAVACHOL) 20 MG tablet Take 1 tablet (20 mg total) by mouth daily. 90 tablet 1  . PRESCRIPTION MEDICATION Zyrtec 10 mg taking daily    . PRESCRIPTION MEDICATION Patonase nasal spray 665 mg taking daily     No current facility-administered medications on file prior to visit.    Social History   Socioeconomic History  . Marital status: Widowed    Spouse name: Not on file  . Number of children: 0  . Years of education: Not on file  . Highest education level: Not on file  Social Needs  . Financial resource strain: Not on file  . Food insecurity - worry: Not on file  . Food insecurity - inability: Not on file  . Transportation needs - medical: Not on file  . Transportation needs - non-medical: Not on file   Occupational History  . Occupation: Retired  Tobacco Use  . Smoking status: Former Smoker    Packs/day: 0.50    Last attempt to quit: 12/03/2013    Years since quitting: 3.3  . Smokeless tobacco: Never Used  Substance and Sexual Activity  . Alcohol use: No    Alcohol/week: 0.0 oz  . Drug use: No  . Sexual activity: Not Currently    Birth control/protection: Post-menopausal, Surgical  Other Topics Concern  . Not on file  Social History Narrative   Marital status:  Widowed x since 2001 from [place]; married x 27 years.  Not dating really in 2019.      Children:  None; failed in vitro.      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      Tobacco: quit ; smoked 20 years.      Alcohol: none      Education: [employer].       Exercise: Yes; 3 times per week 2 hours deep water aerobics.      ADLs: independent ADLs; no assistant devices.      Advanced Directives:  None; FULL CODE; no prolonged measures.    HCPOA: Darlene Jackson/sister-in-law.     Family History  Problem Relation Age of Onset  . Cancer Mother 16       lung cancer  . Hypertension Mother   . Hyperlipidemia Mother   . Mental illness Mother   . Hypertension Sister   . Hypertension Brother   . Diabetes Brother   . Cancer Brother 75       bladder cancer; non-smoker  . Heart disease Brother 65       CABG/CAD  . Hypertension Sister   . Diabetes Sister   . Hypertension Brother   . Diabetes Brother   . Hypertension Brother   . Diabetes Brother   . Hypertension Brother   . COPD Father        Objective:    BP 138/78   Pulse 76   Temp 98 F (36.7 C) (Oral)   Resp 16   Ht 5' 5.95" (1.675 m)   Wt 187 lb (84.8 kg)   SpO2 95%   BMI 30.23 kg/m  Physical Exam  Constitutional: She is oriented to person, place, and time. She appears well-developed and well-nourished. No distress.  HENT:  Head: Normocephalic and atraumatic.  Right Ear: Hearing, tympanic membrane, external  ear and ear canal normal.  Left Ear: Hearing, tympanic membrane, external ear and ear canal normal.  Nose: Nose normal.  Mouth/Throat: Oropharynx is clear and moist.  Eyes: Conjunctivae and EOM are normal. Pupils are equal, round, and reactive to light.  Neck: Normal range of motion and full passive range of motion without pain. Neck supple. No JVD present. Carotid bruit is not present. No thyromegaly present.  Cardiovascular: Normal rate, regular rhythm, normal heart sounds and intact distal pulses. Exam reveals no gallop and no friction rub.  No murmur heard. Pulmonary/Chest: Effort normal and breath sounds normal. No respiratory distress. She has no wheezes. She has no rales. Right breast exhibits no inverted nipple, no mass, no nipple discharge, no skin change and no tenderness. Left breast exhibits no inverted nipple, no mass, no nipple discharge, no skin change and no tenderness. Breasts are symmetrical.  Abdominal: Soft. Bowel sounds are normal. She exhibits no distension and no mass. There is no tenderness. There is no rebound and no guarding.  Genitourinary: Vagina normal. There is no rash, tenderness or lesion on the right labia. There is no rash, tenderness or lesion on the left labia. Right adnexum displays no mass, no tenderness and no fullness. Left adnexum displays no mass, no tenderness and no fullness.  Musculoskeletal:       Right shoulder: Normal.       Left shoulder: Normal.       Cervical back: Normal.  Lymphadenopathy:    She has no cervical adenopathy.  Neurological: She is alert and oriented to person, place, and time. She has normal reflexes. No cranial nerve deficit. She exhibits normal muscle tone. Coordination normal.  Skin: Skin is warm and dry. No rash noted. She is not diaphoretic. No erythema. No pallor.  Psychiatric: She has a normal mood and affect. Her behavior is normal. Judgment and thought content normal.  Nursing note and vitals reviewed.  No results  found. Depression screen [facility] 2/9 04/07/2017 02/28/2017 12/23/2016 10/08/2016 04/06/2016  Decreased Interest 0 0 0 0 0  Down, Depressed, Hopeless 0 0 0 0 0  PHQ - 2 Score 0 0 0 0 0   Fall Risk  04/07/2017 02/28/2017 12/23/2016 10/08/2016 04/06/2016  Falls in the past year? No No No No No    Functional Status Survey: Is  the patient deaf or have difficulty hearing?: Yes Does the patient have difficulty seeing, even when wearing glasses/contacts?: No Does the patient have difficulty concentrating, remembering, or making decisions?: No Does the patient have difficulty walking or climbing stairs?: No Does the patient have difficulty dressing or bathing?: No Does the patient have difficulty doing errands alone such as visiting a doctor's office or shopping?: No     Assessment & Plan:   1. Encounter for Medicare annual wellness exam   2. Routine physical examination   3. High cholesterol   4. Thrombocytosis ([place])   5. H/O splenectomy   6. Chronic idiopathic gout involving toe of right foot without tophus   7. Osteopenia of necks of both femurs   8. Non-allergic rhinitis   9. Class 1 obesity due to excess calories with serious comorbidity and body mass index (BMI) of 31.0 to 31.9 in adult   10. Hyperglycemia   11. Encounter for Papanicolaou smear for cervical cancer screening    -anticipatory guidance provided --- exercise, weight loss, safe driving practices, aspirin 81mg  daily. -obtain age appropriate screening labs and labs for chronic disease management. -low fall risk; no evidence of depression; +mild hearing loss.  Discussed advanced directives and living will; also discussed end of life issues including code status.  -allergic rhinitis poorly controlled; recommend taking Flonase daily; continue follow-up annually with allergy & immunology.   -hypercholesterolemia uncontrolled and reports poor compliance with statin due to arthralgias.  Repeat lipid panel; has been taking supplement.     -recommend weight loss, exercise for 30-60 minutes five days per week; recommend 1200 kcal restriction per day with a minimum of 60 grams of protein per day. -no recurrent gout with allopurinol; obtain uric acid level.   -s/p pelvic exam with pap smear per patient request; reviewed current guidelines.   Orders Placed This Encounter  Procedures  . CBC with Differential/Platelet  . Comprehensive metabolic panel    Order Specific Question:   Has the patient fasted?    Answer:   No  . Hemoglobin A1c  . Lipid panel    Order Specific Question:   Has the patient fasted?    Answer:   No  . TSH  . Uric acid  . POCT urinalysis dipstick   Meds ordered this encounter  Medications  . allopurinol (ZYLOPRIM) 100 MG tablet    Sig: Take 1 tablet (100 mg total) by mouth daily.    Dispense:  90 tablet    Refill:  3  . fluticasone (FLONASE) 50 MCG/ACT nasal spray    Sig: Place 1 spray into both nostrils 2 (two) times daily.    Dispense:  48 g    Refill:  3    Return in about 6 months (around 10/08/2017) for follow-up chronic medical conditions.   Cheresa Siers Elayne Guerin, M.D. Primary Care at [facility] previously [facility] [place] [address] [place], [place]  [address] [phone] phone [phone] fax

## 2017-04-08 ENCOUNTER — Ambulatory Visit: Payer: Medicare HMO

## 2017-04-08 LAB — COMPREHENSIVE METABOLIC PANEL
ALBUMIN: 4.3 g/dL (ref 3.6–4.8)
ALK PHOS: 91 IU/L (ref 39–117)
ALT: 32 IU/L (ref 0–32)
AST: 22 IU/L (ref 0–40)
Albumin/Globulin Ratio: 1.5 (ref 1.2–2.2)
BILIRUBIN TOTAL: 0.3 mg/dL (ref 0.0–1.2)
BUN / CREAT RATIO: 21 (ref 12–28)
BUN: 17 mg/dL (ref 8–27)
CHLORIDE: 104 mmol/L (ref 96–106)
CO2: 19 mmol/L — ABNORMAL LOW (ref 20–29)
Calcium: 9.9 mg/dL (ref 8.7–10.3)
Creatinine, Ser: 0.81 mg/dL (ref 0.57–1.00)
GFR calc Af Amer: 87 mL/min/{1.73_m2} (ref >59)
GFR calc non Af Amer: 75 mL/min/{1.73_m2} (ref >59)
GLUCOSE: 123 mg/dL — AB (ref 65–99)
Globulin, Total: 2.8 g/dL (ref 1.5–4.5)
POTASSIUM: 4.6 mmol/L (ref 3.5–5.2)
Sodium: 143 mmol/L (ref 134–144)
Total Protein: 7.1 g/dL (ref 6.0–8.5)

## 2017-04-08 LAB — CBC WITH DIFFERENTIAL/PLATELET
BASOS ABS: 0.1 10*3/uL (ref 0.0–0.2)
Basos: 1 %
EOS (ABSOLUTE): 0.3 10*3/uL (ref 0.0–0.4)
Eos: 3 %
HEMOGLOBIN: 14.6 g/dL (ref 11.1–15.9)
Hematocrit: 44.4 % (ref 34.0–46.6)
Immature Grans (Abs): 0 10*3/uL (ref 0.0–0.1)
Immature Granulocytes: 0 %
LYMPHS ABS: 4.3 10*3/uL — AB (ref 0.7–3.1)
Lymphs: 48 %
MCH: 30.3 pg (ref 26.6–33.0)
MCHC: 32.9 g/dL (ref 31.5–35.7)
MCV: 92 fL (ref 79–97)
Monocytes Absolute: 0.8 10*3/uL (ref 0.1–0.9)
Monocytes: 10 %
NEUTROS ABS: 3.3 10*3/uL (ref 1.4–7.0)
Neutrophils: 38 %
PLATELETS: 513 10*3/uL — AB (ref 150–379)
RBC: 4.82 x10E6/uL (ref 3.77–5.28)
RDW: 14 % (ref 12.3–15.4)
WBC: 8.7 10*3/uL (ref 3.4–10.8)

## 2017-04-08 LAB — HEMOGLOBIN A1C
Est. average glucose Bld gHb Est-mCnc: 143 mg/dL
Hgb A1c MFr Bld: 6.6 % — ABNORMAL HIGH (ref 4.8–5.6)

## 2017-04-08 LAB — URIC ACID: URIC ACID: 7 mg/dL (ref 2.5–7.1)

## 2017-04-08 LAB — LIPID PANEL
Chol/HDL Ratio: 6.4 ratio — ABNORMAL HIGH (ref 0.0–4.4)
Cholesterol, Total: 268 mg/dL — ABNORMAL HIGH (ref 100–199)
HDL: 42 mg/dL (ref >39)
LDL Calculated: 166 mg/dL — ABNORMAL HIGH (ref 0–99)
Triglycerides: 300 mg/dL — ABNORMAL HIGH (ref 0–149)
VLDL CHOLESTEROL CAL: 60 mg/dL — AB (ref 5–40)

## 2017-04-08 LAB — TSH: TSH: 1.24 u[IU]/mL (ref 0.450–4.500)

## 2017-04-09 LAB — PAP IG W/ RFLX HPV ASCU: PAP Smear Comment: 0

## 2017-04-17 DIAGNOSIS — H40013 Open angle with borderline findings, low risk, bilateral: Secondary | ICD-10-CM | POA: Diagnosis not present

## 2017-04-17 DIAGNOSIS — Z961 Presence of intraocular lens: Secondary | ICD-10-CM | POA: Diagnosis not present

## 2017-04-17 DIAGNOSIS — H35363 Drusen (degenerative) of macula, bilateral: Secondary | ICD-10-CM | POA: Diagnosis not present

## 2017-04-17 DIAGNOSIS — H04123 Dry eye syndrome of bilateral lacrimal glands: Secondary | ICD-10-CM | POA: Diagnosis not present

## 2017-04-29 ENCOUNTER — Telehealth: Payer: Self-pay | Admitting: Family Medicine

## 2017-04-29 DIAGNOSIS — R69 Illness, unspecified: Secondary | ICD-10-CM | POA: Diagnosis not present

## 2017-04-29 NOTE — Telephone Encounter (Signed)
Copied from [place] [id number]. Topic: Quick Communication - See Telephone Encounter >> Apr 29, 2017  1:41 PM Aurelio Brash B wrote: CRM for notification. See Telephone encounter for: 04/29/17.  Pt is asking for lab results from March,  she would like to have those mailed to her

## 2017-04-30 NOTE — Telephone Encounter (Signed)
Provider, please review and release labs.

## 2017-05-13 DIAGNOSIS — R69 Illness, unspecified: Secondary | ICD-10-CM | POA: Diagnosis not present

## 2017-05-28 ENCOUNTER — Encounter: Payer: Self-pay | Admitting: Family Medicine

## 2017-05-28 NOTE — Progress Notes (Deleted)
Subjective:    Patient ID: Sabrina Knight, female    DOB: 1950-04-08, 67 y.o.   MRN: [id number]  05/28/2017  No chief complaint on file.    HPI This 67 y.o. female presents for evaluation of ***. BP Readings from Last 3 Encounters:  04/07/17 138/78  02/28/17 (!) 142/86  12/23/16 126/72   Wt Readings from Last 3 Encounters:  04/07/17 187 lb (84.8 kg)  02/28/17 187 lb (84.8 kg)  12/23/16 188 lb (85.3 kg)   Immunization History  Administered Date(s) Administered  . Influenza,inj,Quad PF,6+ Mos 10/08/2016  . Pneumococcal Conjugate-13 10/08/2016  . Td 10/08/2016  . Zoster 06/29/2010    Review of Systems  Past Medical History:  Diagnosis Date  . Allergy    Zyrtec, Singulair; allergy testing negative; Orvil Feil.  . Cataract   . Clotting disorder ([place])   . Glaucoma   . ITP (idiopathic thrombocytopenic purpura)    Past Surgical History:  Procedure Laterality Date  . ABDOMINAL HYSTERECTOMY  01/22/1999   ovaries resected; DUB/fibroids  . 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 spleen removal     No Known Allergies Current Outpatient Medications on File Prior to Visit  Medication Sig Dispense Refill  . allopurinol (ZYLOPRIM) 100 MG tablet Take 1 tablet (100 mg total) by mouth daily. 90 tablet 3  . brimonidine (ALPHAGAN) 0.2 % ophthalmic solution 1 DROP INTO RIGHT EYE TWICE A DAY START 48 HOURS PRIOR TO SURGERY    . carboxymethylcellulose (REFRESH PLUS) 0.5 % SOLN Apply 1 drop to eye 3 (three) times daily as needed (for dry eyes).    . colchicine 0.6 MG tablet TAKE 1 TABLET (0.6 MG TOTAL) BY MOUTH 2 (TWO) TIMES DAILY AS NEEDED. 30 tablet 2  . fexofenadine (ALLEGRA) 180 MG tablet TAKE 1 TABLET BY MOUTH EVERY DAY    . fluticasone (FLONASE) 50 MCG/ACT nasal spray Place 1 spray into both nostrils 2 (two) times daily. 48 g 3  . Fluticasone Furoate (ARNUITY ELLIPTA) 100 MCG/ACT AEPB INHALE ONCE A DAY    . GAVILYTE-N WITH FLAVOR PACK 420 g solution See admin instructions.  0  .  montelukast (SINGULAIR) 10 MG tablet Take 10 mg by mouth daily.    . Multiple Vitamin (MULTIVITAMIN WITH MINERALS) TABS tablet Take 1 tablet by mouth daily.    . Olopatadine HCl 0.6 % SOLN Place 1 spray into the nose daily.    . pravastatin (PRAVACHOL) 20 MG tablet Take 1 tablet (20 mg total) by mouth daily. 90 tablet 1  . PRESCRIPTION MEDICATION Zyrtec 10 mg taking daily    . PRESCRIPTION MEDICATION Patonase nasal spray 665 mg taking daily     No current facility-administered medications on file prior to visit.    Social History   Socioeconomic History  . Marital status: Widowed    Spouse name: Not on file  . Number of children: 0  . Years of education: Not on file  . Highest education level: Not on file  Occupational History  . Occupation: Retired  Scientific laboratory technician  . Financial resource strain: Not on file  . Food insecurity:    Worry: Not on file    Inability: Not on file  . Transportation needs:    Medical: Not on file    Non-medical: Not on file  Tobacco Use  . Smoking status: Former Smoker    Packs/day: 0.50    Last attempt to quit: 12/03/2013    Years since quitting: 3.4  .  Smokeless tobacco: Never Used  Substance and Sexual Activity  . Alcohol use: No    Alcohol/week: 0.0 oz  . Drug use: No  . Sexual activity: Not Currently    Birth control/protection: Post-menopausal, Surgical  Lifestyle  . Physical activity:    Days per week: Not on file    Minutes per session: Not on file  . Stress: Not on file  Relationships  . Social connections:    Talks on phone: Not on file    Gets together: Not on file    Attends religious service: Not on file    Active member of club or organization: Not on file    Attends meetings of clubs or organizations: Not on file    Relationship status: Not on file  . Intimate partner violence:    Fear of current or ex partner: Not on file    Emotionally abused: Not on file    Physically abused: Not on file    Forced sexual activity: Not on  file  Other Topics Concern  . Not on file  Social History Narrative   Marital status:  Widowed x since 2001 from [place]; married x 27 years.  Not dating really in 2019.      Children:  None; failed in vitro.      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      Tobacco: quit ; smoked 20 years.      Alcohol: none      Education: [employer].       Exercise: Yes; 3 times per week 2 hours deep water aerobics.      ADLs: independent ADLs; no assistant devices.      Advanced Directives:  None; FULL CODE; no prolonged measures.    HCPOA: Darlene Jackson/sister-in-law.     Family History  Problem Relation Age of Onset  . Cancer Mother 25       lung cancer  . Hypertension Mother   . Hyperlipidemia Mother   . Mental illness Mother   . Hypertension Sister   . Hypertension Brother   . Diabetes Brother   . Cancer Brother 13       bladder cancer; non-smoker  . Heart disease Brother 24       CABG/CAD  . Hypertension Sister   . Diabetes Sister   . Hypertension Brother   . Diabetes Brother   . Hypertension Brother   . Diabetes Brother   . Hypertension Brother   . COPD Father        Objective:    There were no vitals taken for this visit. Physical Exam No results found. Depression screen [facility] 2/9 04/07/2017 02/28/2017 12/23/2016 10/08/2016 04/06/2016  Decreased Interest 0 0 0 0 0  Down, Depressed, Hopeless 0 0 0 0 0  PHQ - 2 Score 0 0 0 0 0   Fall Risk  04/07/2017 02/28/2017 12/23/2016 10/08/2016 04/06/2016  Falls in the past year? No No No No No        Assessment & Plan:  No diagnosis found.  ***  No orders of the defined types were placed in this encounter.  No orders of the defined types were placed in this encounter.   No follow-ups on file.   Johni Narine Elayne Guerin, M.D. Primary Care at [facility] previously [facility] [place] [address] [place], [place]  [address] [phone] phone [phone] fax

## 2017-06-01 NOTE — Telephone Encounter (Signed)
Copy of labs provided to pt.

## 2017-06-17 ENCOUNTER — Encounter: Payer: Self-pay | Admitting: Family Medicine

## 2017-07-03 DIAGNOSIS — H16223 Keratoconjunctivitis sicca, not specified as Sjogren's, bilateral: Secondary | ICD-10-CM | POA: Diagnosis not present

## 2017-07-03 DIAGNOSIS — H1013 Acute atopic conjunctivitis, bilateral: Secondary | ICD-10-CM | POA: Diagnosis not present

## 2017-07-03 DIAGNOSIS — H04123 Dry eye syndrome of bilateral lacrimal glands: Secondary | ICD-10-CM | POA: Diagnosis not present

## 2017-07-03 DIAGNOSIS — H40013 Open angle with borderline findings, low risk, bilateral: Secondary | ICD-10-CM | POA: Diagnosis not present

## 2017-07-30 ENCOUNTER — Other Ambulatory Visit: Payer: Self-pay | Admitting: Family Medicine

## 2017-10-02 DIAGNOSIS — J300 Vasomotor rhinitis: Secondary | ICD-10-CM | POA: Diagnosis not present

## 2017-10-02 DIAGNOSIS — J4530 Mild persistent asthma, uncomplicated: Secondary | ICD-10-CM | POA: Diagnosis not present

## 2017-10-10 ENCOUNTER — Telehealth: Payer: Self-pay | Admitting: General Practice

## 2017-10-10 NOTE — Telephone Encounter (Signed)
Copied from [place] [id number]. Topic: Quick Communication - Rx Refill/Question >> Oct 10, 2017 11:46 AM Scherrie Gerlach wrote: Medication: allopurinol (ZYLOPRIM) 100 MG tablet  Pt states Dr Tamala Julian advised pt to take 2 of this a day. So pt had run out early and pharmacy will not fill. Pt would like a new rx with new direction for 2 a day. Pt would like a 90 day (180 tabs)  [id number] [place] [address], [place] [phone] (Phone) [phone] (Fax)  Pt has OV with Dr Nolon Rod on 10/20/17

## 2017-10-14 ENCOUNTER — Other Ambulatory Visit: Payer: Self-pay

## 2017-10-14 MED ORDER — ALLOPURINOL 100 MG PO TABS: 100.0000 mg | ORAL_TABLET | Freq: Two times a day (BID) | ORAL | 0 refills | Status: DC

## 2017-10-14 NOTE — Telephone Encounter (Signed)
Rx sent to pharmacy   

## 2017-10-15 DIAGNOSIS — R69 Illness, unspecified: Secondary | ICD-10-CM | POA: Diagnosis not present

## 2017-10-16 ENCOUNTER — Encounter: Payer: Self-pay | Admitting: Family Medicine

## 2017-10-16 DIAGNOSIS — M8589 Other specified disorders of bone density and structure, multiple sites: Secondary | ICD-10-CM | POA: Diagnosis not present

## 2017-10-16 DIAGNOSIS — Z780 Asymptomatic menopausal state: Secondary | ICD-10-CM | POA: Diagnosis not present

## 2017-10-16 DIAGNOSIS — Z1231 Encounter for screening mammogram for malignant neoplasm of breast: Secondary | ICD-10-CM | POA: Diagnosis not present

## 2017-10-16 LAB — HM MAMMOGRAPHY

## 2017-10-19 NOTE — Progress Notes (Signed)
Chief Complaint  Patient presents with  . chronic medical conditions    follow up    HPI  This is a former patient of Reginia Forts MD who is no longer with our practice.  She was last seen April 07, 2017 At that time she had an annual wellness exam with labs  Diabetes Mellitus: Patient presents with new onset of Type 2 diabetes. Symptoms: none.Patient denies foot ulcerations, hyperglycemia, hypoglycemia  and increase appetite.  Evaluation to date has been included: hemoglobin A1C.  Home sugars: patient does not check sugars. Treatment to date: none.  Lab Results  Component Value Date   HGBA1C 6.4 (A) 10/20/2017   Thrombocytosis Lab Results  Component Value Date   WBC 8.7 04/07/2017   HGB 14.6 04/07/2017   HCT 44.4 04/07/2017   MCV 92 04/07/2017   PLT 513 (H) 04/07/2017   She is s/p splenectomy  Current symptoms includes   Idiopathic Gout Lab Results  Component Value Date   LABURIC 7.0 04/07/2017   She reports that she has been taking the allupurinol She is taking 1 tab of allopurinol bid She denies any side effects of alluprinol - no bruising, abd pain, n/v  Dyslipidemia: Patient presents for evaluation of lipids.  Compliance with treatment thus far has been unsatisfactory due to chronic pain and arthralgia related to all statins.  A repeat fasting lipid profile was done.  The patient does not use medications that may worsen dyslipidemias (corticosteroids, progestins, anabolic steroids, diuretics, beta-blockers, amiodarone, cyclosporine, olanzapine). The patient exercises three times a week. For 2 hours and does water aerobics.  The patient is not known to have coexisting coronary artery disease.  She is not taking Prevachol but does not take   The 10-year ASCVD risk score Mikey Bussing DC Brooke Bonito., et al., 2013) is: 16%   Values used to calculate the score:     Age: 67 years     Sex: Female     Is Non-Hispanic African American: No     Diabetic: Yes     Tobacco smoker: No  Systolic Blood Pressure: 478 mmHg     Is BP treated: No     HDL Cholesterol: 42 mg/dL     Total Cholesterol: 268 mg/dL  Lab Results  Component Value Date   CHOL 268 (H) 04/07/2017   CHOL 257 (H) 10/08/2016   CHOL 256 (H) 04/06/2016   Lab Results  Component Value Date   HDL 42 04/07/2017   HDL 50 10/08/2016   HDL 45 04/06/2016   Lab Results  Component Value Date   LDLCALC 166 (H) 04/07/2017   LDLCALC 143 (H) 10/08/2016   LDLCALC 152 (H) 04/06/2016   Lab Results  Component Value Date   TRIG 300 (H) 04/07/2017   TRIG 322 (H) 10/08/2016   TRIG 295 (H) 04/06/2016   Lab Results  Component Value Date   CHOLHDL 6.4 (H) 04/07/2017   CHOLHDL 5.1 (H) 10/08/2016   CHOLHDL 5.7 (H) 04/06/2016   No results found for: LDLDIRECT   Past Medical History:  Diagnosis Date  . Allergy    Zyrtec, Singulair; allergy testing negative; Orvil Feil.  . Cataract   . Clotting disorder ([place])   . Glaucoma   . ITP (idiopathic thrombocytopenic purpura)     Current Outpatient Medications  Medication Sig Dispense Refill  . allopurinol (ZYLOPRIM) 100 MG tablet Take 1 tablet (100 mg total) by mouth 2 (two) times daily. 180 tablet 0  . brimonidine (ALPHAGAN) 0.2 % ophthalmic solution 1  DROP INTO RIGHT EYE TWICE A DAY START 48 HOURS PRIOR TO SURGERY    . carboxymethylcellulose (REFRESH PLUS) 0.5 % SOLN Apply 1 drop to eye 3 (three) times daily as needed (for dry eyes).    . colchicine 0.6 MG tablet TAKE 1 TABLET (0.6 MG TOTAL) BY MOUTH 2 (TWO) TIMES DAILY AS NEEDED. 30 tablet 2  . fexofenadine (ALLEGRA) 180 MG tablet TAKE 1 TABLET BY MOUTH EVERY DAY    . fluticasone (FLONASE) 50 MCG/ACT nasal spray Place 1 spray into both nostrils 2 (two) times daily. 48 g 3  . Fluticasone Furoate (ARNUITY ELLIPTA) 100 MCG/ACT AEPB INHALE ONCE A DAY    . GAVILYTE-N WITH FLAVOR PACK 420 g solution See admin instructions.  0  . montelukast (SINGULAIR) 10 MG tablet Take 10 mg by mouth daily.    . Multiple Vitamin  (MULTIVITAMIN WITH MINERALS) TABS tablet Take 1 tablet by mouth daily.    . Olopatadine HCl 0.6 % SOLN Place 1 spray into the nose daily.    . pravastatin (PRAVACHOL) 20 MG tablet TAKE 1 TABLET BY MOUTH EVERY DAY 90 tablet 1  . PRESCRIPTION MEDICATION Zyrtec 10 mg taking daily    . PRESCRIPTION MEDICATION Patonase nasal spray 665 mg taking daily     No current facility-administered medications for this visit.     Allergies: No Known Allergies  Past Surgical History:  Procedure Laterality Date  . ABDOMINAL HYSTERECTOMY  01/22/1999   ovaries resected; DUB/fibroids  . 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 spleen removal      Social History   Socioeconomic History  . Marital status: Widowed    Spouse name: Not on file  . Number of children: 0  . Years of education: Not on file  . Highest education level: Not on file  Occupational History  . Occupation: Retired  Scientific laboratory technician  . Financial resource strain: Not on file  . Food insecurity:    Worry: Not on file    Inability: Not on file  . Transportation needs:    Medical: Not on file    Non-medical: Not on file  Tobacco Use  . Smoking status: Former Smoker    Packs/day: 0.50    Last attempt to quit: 12/03/2013    Years since quitting: 3.8  . Smokeless tobacco: Never Used  Substance and Sexual Activity  . Alcohol use: No    Alcohol/week: 0.0 standard drinks  . Drug use: No  . Sexual activity: Not Currently    Birth control/protection: Post-menopausal, Surgical  Lifestyle  . Physical activity:    Days per week: Not on file    Minutes per session: Not on file  . Stress: Not on file  Relationships  . Social connections:    Talks on phone: Not on file    Gets together: Not on file    Attends religious service: Not on file    Active member of club or organization: Not on file    Attends meetings of clubs or organizations: Not on file    Relationship status: Not on file  Other Topics Concern  . Not on file  Social  History Narrative   Marital status:  Widowed x since 2001 from [place]; married x 27 years.  Not dating really in 2019.      Children:  None; failed in vitro.      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  Tobacco: quit ; smoked 20 years.      Alcohol: none      Education: [employer].       Exercise: Yes; 3 times per week 2 hours deep water aerobics.      ADLs: independent ADLs; no assistant devices.      Advanced Directives:  None; FULL CODE; no prolonged measures.    HCPOA: Darlene Jackson/sister-in-law.      Family History  Problem Relation Age of Onset  . Cancer Mother 3       lung cancer  . Hypertension Mother   . Hyperlipidemia Mother   . Mental illness Mother   . Hypertension Sister   . Hypertension Brother   . Diabetes Brother   . Cancer Brother 49       bladder cancer; non-smoker  . Heart disease Brother 73       CABG/CAD  . Hypertension Sister   . Diabetes Sister   . Hypertension Brother   . Diabetes Brother   . Hypertension Brother   . Diabetes Brother   . Hypertension Brother   . COPD Father      ROS Review of Systems See HPI Constitution: No fevers or chills No malaise No diaphoresis Skin: No rash or itching Eyes: no blurry vision, no double vision GU: no dysuria or hematuria Neuro: no dizziness or headaches all others reviewed and negative   Objective: Vitals:   10/20/17 0832  BP: 123/77  Pulse: 77  Resp: 17  Temp: 97.9 F (36.6 C)  TempSrc: Oral  SpO2: 95%  Weight: 190 lb (86.2 kg)  Height: 5' 5.95" (1.675 m)   Diabetic Foot Exam - Simple   Simple Foot Form Diabetic Foot exam was performed with the following findings:  Yes 10/20/2017  8:34 AM  Visual Inspection No deformities, no ulcerations, no other skin breakdown bilaterally:  Yes Sensation Testing See comments:  Yes Pulse Check Posterior Tibialis and Dorsalis pulse intact bilaterally:  Yes Comments Site 9 on both feet barely any  sensation per pt, also at sites 2 and 3 both feet.     Physical Exam  Constitutional: She is oriented to person, place, and time. She appears well-developed and well-nourished.  HENT:  Head: Normocephalic and atraumatic.  Eyes: Conjunctivae and EOM are normal.  Neck: Normal range of motion. Neck supple.  Cardiovascular: Normal rate, regular rhythm and normal heart sounds.  Pulmonary/Chest: Effort normal and breath sounds normal. No stridor. No respiratory distress. She has no wheezes.  Neurological: She is alert and oriented to person, place, and time.  Skin: Skin is warm. Capillary refill takes less than 2 seconds.  Psychiatric: She has a normal mood and affect. Her behavior is normal. Judgment and thought content normal.    Assessment and Plan Quaniyah was seen today for chronic medical conditions.  Diagnoses and all orders for this visit:  Newly diagnosed diabetes [facility])-  Discussed her a1c Discussed standard of care  -     Comprehensive metabolic panel -     POCT glycosylated hemoglobin (Hb A1C) -     HM DIABETES FOOT EXAM  Thrombocytosis ([place])- last plt stable  -     CBC with Differential/Platelet  Dyslipidemia- discussed praluent and other options for treated  -     Comprehensive metabolic panel -     Lipid panel  Need for prophylactic vaccination against Streptococcus pneumoniae (pneumococcus) -     Pneumococcal polysaccharide vaccine 23-valent greater than or equal to 2yo subcutaneous/IM  Chronic  idiopathic gout involving toe of right foot without tophus     Shanteria Laye A Nolon Rod

## 2017-10-20 ENCOUNTER — Ambulatory Visit: Payer: Medicare HMO | Admitting: Family Medicine

## 2017-10-20 ENCOUNTER — Other Ambulatory Visit: Payer: Self-pay

## 2017-10-20 ENCOUNTER — Ambulatory Visit (INDEPENDENT_AMBULATORY_CARE_PROVIDER_SITE_OTHER): Payer: Medicare HMO | Admitting: Family Medicine

## 2017-10-20 ENCOUNTER — Encounter: Payer: Self-pay | Admitting: Family Medicine

## 2017-10-20 VITALS — BP 123/77 | HR 77 | Temp 97.9°F | Resp 17 | Ht 65.95 in | Wt 190.0 lb

## 2017-10-20 DIAGNOSIS — M1A0710 Idiopathic chronic gout, right ankle and foot, without tophus (tophi): Secondary | ICD-10-CM

## 2017-10-20 DIAGNOSIS — D75839 Thrombocytosis, unspecified: Secondary | ICD-10-CM

## 2017-10-20 DIAGNOSIS — E785 Hyperlipidemia, unspecified: Secondary | ICD-10-CM

## 2017-10-20 DIAGNOSIS — Z23 Encounter for immunization: Secondary | ICD-10-CM

## 2017-10-20 DIAGNOSIS — D473 Essential (hemorrhagic) thrombocythemia: Secondary | ICD-10-CM | POA: Diagnosis not present

## 2017-10-20 DIAGNOSIS — E119 Type 2 diabetes mellitus without complications: Secondary | ICD-10-CM

## 2017-10-20 DIAGNOSIS — Z789 Other specified health status: Secondary | ICD-10-CM

## 2017-10-20 LAB — COMPREHENSIVE METABOLIC PANEL
ALT: 48 IU/L — ABNORMAL HIGH (ref 0–32)
AST: 32 IU/L (ref 0–40)
Albumin/Globulin Ratio: 1.6 (ref 1.2–2.2)
Albumin: 4.3 g/dL (ref 3.6–4.8)
Alkaline Phosphatase: 96 IU/L (ref 39–117)
BUN/Creatinine Ratio: 19 (ref 12–28)
BUN: 16 mg/dL (ref 8–27)
Bilirubin Total: 0.3 mg/dL (ref 0.0–1.2)
CO2: 22 mmol/L (ref 20–29)
Calcium: 10.3 mg/dL (ref 8.7–10.3)
Chloride: 102 mmol/L (ref 96–106)
Creatinine, Ser: 0.84 mg/dL (ref 0.57–1.00)
GFR calc Af Amer: 83 mL/min/{1.73_m2} (ref >59)
GFR calc non Af Amer: 72 mL/min/{1.73_m2} (ref >59)
Globulin, Total: 2.7 g/dL (ref 1.5–4.5)
Glucose: 106 mg/dL — ABNORMAL HIGH (ref 65–99)
Potassium: 4.5 mmol/L (ref 3.5–5.2)
Sodium: 141 mmol/L (ref 134–144)
Total Protein: 7 g/dL (ref 6.0–8.5)

## 2017-10-20 LAB — CBC WITH DIFFERENTIAL/PLATELET
BASOS: 1 %
Basophils Absolute: 0.1 10*3/uL (ref 0.0–0.2)
EOS (ABSOLUTE): 0.3 10*3/uL (ref 0.0–0.4)
EOS: 3 %
HEMATOCRIT: 43.3 % (ref 34.0–46.6)
HEMOGLOBIN: 15 g/dL (ref 11.1–15.9)
IMMATURE GRANS (ABS): 0 10*3/uL (ref 0.0–0.1)
IMMATURE GRANULOCYTES: 0 %
LYMPHS: 51 %
Lymphocytes Absolute: 4 10*3/uL — ABNORMAL HIGH (ref 0.7–3.1)
MCH: 31.4 pg (ref 26.6–33.0)
MCHC: 34.6 g/dL (ref 31.5–35.7)
MCV: 91 fL (ref 79–97)
MONOCYTES: 9 %
Monocytes Absolute: 0.7 10*3/uL (ref 0.1–0.9)
NEUTROS PCT: 36 %
Neutrophils Absolute: 2.9 10*3/uL (ref 1.4–7.0)
Platelets: 503 10*3/uL — ABNORMAL HIGH (ref 150–450)
RBC: 4.77 x10E6/uL (ref 3.77–5.28)
RDW: 13.9 % (ref 12.3–15.4)
WBC: 8 10*3/uL (ref 3.4–10.8)

## 2017-10-20 LAB — POCT GLYCOSYLATED HEMOGLOBIN (HGB A1C): Hemoglobin A1C: 6.4 % — AB (ref 4.0–5.6)

## 2017-10-20 LAB — LIPID PANEL
CHOL/HDL RATIO: 5.2 ratio — AB (ref 0.0–4.4)
CHOLESTEROL TOTAL: 235 mg/dL — AB (ref 100–199)
HDL: 45 mg/dL (ref >39)
LDL CALC: 143 mg/dL — AB (ref 0–99)
TRIGLYCERIDES: 234 mg/dL — AB (ref 0–149)
VLDL Cholesterol Cal: 47 mg/dL — ABNORMAL HIGH (ref 5–40)

## 2017-10-20 NOTE — Patient Instructions (Addendum)
   If you have lab work done today you will be contacted with your lab results within the next 2 weeks.  If you have not heard from us then please contact us. The fastest way to get your results is to register for My Chart.   IF you received an x-ray today, you will receive an invoice from [facility]. Please contact [facility] at [phone] with questions or concerns regarding your invoice.   IF you received labwork today, you will receive an invoice from [facility]. Please contact [facility] at [phone] with questions or concerns regarding your invoice.   Our billing staff will not be able to assist you with questions regarding bills from these companies.  You will be contacted with the lab results as soon as they are available. The fastest way to get your results is to activate your My Chart account. Instructions are located on the last page of this paperwork. If you have not heard from us regarding the results in 2 weeks, please contact this office.     Diabetes Mellitus and Standards of Medical Care Managing diabetes (diabetes mellitus) can be complicated. Your diabetes treatment may be managed by a team of health care providers, including:  A diet and nutrition specialist (registered dietitian).  A nurse.  A certified diabetes educator (CDE).  A diabetes specialist (endocrinologist).  An eye doctor.  A primary care provider.  A dentist.  Your health care providers follow a schedule in order to help you get the best quality of care. The following schedule is a general guideline for your diabetes management plan. Your health care providers may also give you more specific instructions. HbA1c ( hemoglobin A1c) test This test provides information about blood sugar (glucose) control over the previous 2-3 months. It is used to check whether your diabetes management plan needs to be adjusted.  If you are meeting your treatment goals, this test is done at  least 2 times a year.  If you are not meeting treatment goals or if your treatment goals have changed, this test is done 4 times a year.  Blood pressure test  This test is done at every routine medical visit. For most people, the goal is less than 130/80. Ask your health care provider what your goal blood pressure should be. Dental and eye exams  Visit your dentist two times a year.  If you have type 1 diabetes, get an eye exam 3-5 years after you are diagnosed, and then once a year after your first exam. ? If you were diagnosed with type 1 diabetes as a child, get an eye exam when you are age 10 or older and have had diabetes for 3-5 years. After the first exam, you should get an eye exam once a year.  If you have type 2 diabetes, have an eye exam as soon as you are diagnosed, and then once a year after your first exam. Foot care exam  Visual foot exams are done at every routine medical visit. The exams check for cuts, bruises, redness, blisters, sores, or other problems with the feet.  A complete foot exam is done by your health care provider once a year. This exam includes an inspection of the structure and skin of your feet, and a check of the pulses and sensation in your feet. ? Type 1 diabetes: Get your first exam 3-5 years after diagnosis. ? Type 2 diabetes: Get your first exam as soon as you are diagnosed.  Check your   feet every day for cuts, bruises, redness, blisters, or sores. If you have any of these or other problems that are not healing, contact your health care provider. Kidney function test ( urine microalbumin)  This test is done once a year. ? Type 1 diabetes: Get your first test 5 years after diagnosis. ? Type 2 diabetes: Get your first test as soon as you are diagnosed.  If you have chronic kidney disease (CKD), get a serum creatinine and estimated glomerular filtration rate (eGFR) test once a year. Lipid profile (cholesterol, HDL, LDL, triglycerides)  This test  should be done when you are diagnosed with diabetes, and every 5 years after the first test. If you are on medicines to lower your cholesterol, you may need to get this test done every year. ? The goal for LDL is less than 100 mg/dL (5.5 mmol/L). If you are at high risk, the goal is less than 70 mg/dL (3.9 mmol/L). ? The goal for HDL is 40 mg/dL (2.2 mmol/L) for men and 50 mg/dL(2.8 mmol/L) for women. An HDL cholesterol of 60 mg/dL (3.3 mmol/L) or higher gives some protection against heart disease. ? The goal for triglycerides is less than 150 mg/dL (8.3 mmol/L). Immunizations  The yearly flu (influenza) vaccine is recommended for everyone 6 months or older who has diabetes.  The pneumonia (pneumococcal) vaccine is recommended for everyone 2 years or older who has diabetes. If you are 65 or older, you may get the pneumonia vaccine as a series of two separate shots.  The hepatitis B vaccine is recommended for adults shortly after they have been diagnosed with diabetes.  The Tdap (tetanus, diphtheria, and pertussis) vaccine should be given: ? According to normal childhood vaccination schedules, for children. ? Every 10 years, for adults who have diabetes.  The shingles vaccine is recommended for people who have had chicken pox and are 50 years or older. Mental and emotional health  Screening for symptoms of eating disorders, anxiety, and depression is recommended at the time of diagnosis and afterward as needed. If your screening shows that you have symptoms (you have a positive screening result), you may need further evaluation and be referred to a mental health care provider. Diabetes self-management education  Education about how to manage your diabetes is recommended at diagnosis and ongoing as needed. Treatment plan  Your treatment plan will be reviewed at every medical visit. Summary  Managing diabetes (diabetes mellitus) can be complicated. Your diabetes treatment may be managed by a  team of health care providers.  Your health care providers follow a schedule in order to help you get the best quality of care.  Standards of care including having regular physical exams, blood tests, blood pressure monitoring, immunizations, screening tests, and education about how to manage your diabetes.  Your health care providers may also give you more specific instructions based on your individual health. This information is not intended to replace advice given to you by your health care provider. Make sure you discuss any questions you have with your health care provider. Document Released: 11/04/2008 Document Revised: 10/06/2015 Document Reviewed: 10/06/2015 Elsevier Interactive Patient Education  2018 Elsevier Inc.  

## 2017-10-22 ENCOUNTER — Telehealth: Payer: Self-pay

## 2017-10-22 DIAGNOSIS — Z789 Other specified health status: Secondary | ICD-10-CM | POA: Insufficient documentation

## 2017-10-22 DIAGNOSIS — E119 Type 2 diabetes mellitus without complications: Secondary | ICD-10-CM | POA: Insufficient documentation

## 2017-10-22 DIAGNOSIS — E785 Hyperlipidemia, unspecified: Secondary | ICD-10-CM | POA: Insufficient documentation

## 2017-10-22 NOTE — Telephone Encounter (Signed)
Left message on voicemail to return my call Britt Boozer) as we needed additional info to complete Patient assistance program enrollment form. Dgaddy, CMA

## 2017-10-22 NOTE — Telephone Encounter (Signed)
Patient returning my call and all additional info obtained from pt and patient assistance program enrollment form faxed back  To [phone].  Dgaddy, CMA

## 2017-10-24 NOTE — Telephone Encounter (Signed)
In refill section in #2 section of form 3 refills were given and faxed back. Dgaddy, CMA

## 2017-10-24 NOTE — Telephone Encounter (Signed)
Sabrina Knight is calling from the patient Assistance program regarding Praluent.  On the paperwork the amount of refills is not indicate. They would need section 1 & 2 completed Or just a current prescription. Fax [phone]

## 2017-10-27 NOTE — Telephone Encounter (Signed)
Patient called back to speak with Deloris Gaddy or Dr Nolon Rod, she stated that [facility] called to say that paperwork is incomplete. Pas ph# [phone] Patient will be out of town on 10/28/17 but can be reached by cell phone is any questions to be answered. Ph# [phone]

## 2017-10-28 ENCOUNTER — Encounter: Payer: Self-pay | Admitting: *Deleted

## 2017-10-28 MED ORDER — ALIROCUMAB 75 MG/ML ~~LOC~~ SOPN: 75.0000 mg | PEN_INJECTOR | SUBCUTANEOUS | 6 refills | Status: DC

## 2017-10-28 NOTE — Addendum Note (Signed)
Addended by: Delia Chimes A on: 10/28/2017 10:14 AM   Modules accepted: Orders

## 2017-10-28 NOTE — Telephone Encounter (Signed)
Printed prescription of Praluent. Will fax to PAS program.

## 2017-10-28 NOTE — Telephone Encounter (Signed)
Rx for alirocumab (praluent) 75 mg/ml rx sent via fax to [phone] Dgaddy, CMA

## 2017-11-03 ENCOUNTER — Telehealth: Payer: Self-pay | Admitting: Family Medicine

## 2017-11-03 NOTE — Telephone Encounter (Signed)
Copied from [place] [id number]. Topic: General - Inquiry >> Nov 03, 2017  4:19 PM Sabrina Knight wrote: Reason for CRM: [name] called stating pt applied for the free drug program for Alirocumab (PRALUENT) 75 MG/ML SOPN. They have been trying to contact the pt regarding her financial qualifications for the program and have not been successful. [name] is requesting the office to reach out to her to contact [employer] for this program. Phone # for pt to call [phone].

## 2017-11-04 NOTE — Telephone Encounter (Signed)
Called and informed pt to call [name]  at [phone] to discuss help with her medication.

## 2017-11-14 NOTE — Telephone Encounter (Signed)
Sent pt a MYCHART message asking her to inquire as to what information was missing. Asked her to send to [place] via MyChart and if we can complete any further forms for her about the missing information we would be gland to assist.

## 2017-11-14 NOTE — Telephone Encounter (Signed)
Patient is calling because she received letter regarding the drug Praluent that information was missing from the patient or provider.  This made her not elligible for the study. Patient Assistance Support [phone], [phone]-Fax   Please advise with Patient [phone]  Patient unsure if she just did not qualify.  She is unsure.  Based on program elligibity, she did not qualify.

## 2017-11-28 ENCOUNTER — Ambulatory Visit: Payer: Medicare HMO | Admitting: Family Medicine

## 2018-01-10 ENCOUNTER — Other Ambulatory Visit: Payer: Self-pay | Admitting: Family Medicine

## 2018-03-25 DIAGNOSIS — R200 Anesthesia of skin: Secondary | ICD-10-CM | POA: Diagnosis not present

## 2018-03-25 DIAGNOSIS — R52 Pain, unspecified: Secondary | ICD-10-CM | POA: Diagnosis not present

## 2018-03-25 DIAGNOSIS — M180 Bilateral primary osteoarthritis of first carpometacarpal joints: Secondary | ICD-10-CM | POA: Diagnosis not present

## 2018-03-27 ENCOUNTER — Encounter: Payer: Self-pay | Admitting: Neurology

## 2018-03-27 ENCOUNTER — Other Ambulatory Visit: Payer: Self-pay | Admitting: *Deleted

## 2018-03-27 DIAGNOSIS — R200 Anesthesia of skin: Secondary | ICD-10-CM

## 2018-03-31 DIAGNOSIS — G8929 Other chronic pain: Secondary | ICD-10-CM | POA: Diagnosis not present

## 2018-03-31 DIAGNOSIS — M180 Bilateral primary osteoarthritis of first carpometacarpal joints: Secondary | ICD-10-CM | POA: Diagnosis not present

## 2018-03-31 DIAGNOSIS — M79646 Pain in unspecified finger(s): Secondary | ICD-10-CM | POA: Diagnosis not present

## 2018-04-05 ENCOUNTER — Other Ambulatory Visit: Payer: Self-pay | Admitting: Family Medicine

## 2018-04-06 NOTE — Telephone Encounter (Signed)
Requested medication (s) are due for refill today: Yes  Requested medication (s) are on the active medication list: Yes  Last refill:  01/12/18  Future visit scheduled: No  Notes to clinic:  Uric acid last on 04/07/17, failed on protocol, unable to refill.     Requested Prescriptions  Pending Prescriptions Disp Refills   allopurinol (ZYLOPRIM) 100 MG tablet [Pharmacy Med Name: ALLOPURINOL 100 MG TABLET] 180 tablet 0    Sig: TAKE 1 TABLET BY MOUTH TWICE A DAY     Endocrinology:  Gout Agents Failed - 04/05/2018  1:14 AM      Failed - Uric Acid in normal range and within 360 days    Uric Acid  Date Value Ref Range Status  04/07/2017 7.0 2.5 - 7.1 mg/dL Final    Comment:               Therapeutic target for gout patients: <6.0         Passed - Cr in normal range and within 360 days    Creat  Date Value Ref Range Status  10/03/2015 0.67 0.50 - 0.99 mg/dL Final    Comment:      For patients > or = 69 years of age: The upper reference limit for Creatinine is approximately 13% higher for people identified as African-American.      Creatinine, Ser  Date Value Ref Range Status  10/20/2017 0.84 0.57 - 1.00 mg/dL Final         Passed - Valid encounter within last 12 months    Recent Outpatient Visits          5 months ago Newly diagnosed diabetes [facility])   Primary Care at [facility], Arlie Solomons, MD   12 months ago Encounter for Medicare annual wellness exam   Primary Care at [facility], Renette Butters, MD   1 year ago Nasal congestion   [facility] at [facility], Lilia Argue, MD   1 year ago Acute idiopathic gout involving toe of right foot   Primary Care at [facility], Renette Butters, MD   1 year ago Acute bronchitis, unspecified organism   Primary Care at [facility], Laurey Arrow, MD

## 2018-04-07 ENCOUNTER — Ambulatory Visit (INDEPENDENT_AMBULATORY_CARE_PROVIDER_SITE_OTHER): Payer: Medicare HMO | Admitting: Neurology

## 2018-04-07 ENCOUNTER — Other Ambulatory Visit: Payer: Self-pay

## 2018-04-07 DIAGNOSIS — R200 Anesthesia of skin: Secondary | ICD-10-CM | POA: Diagnosis not present

## 2018-04-07 DIAGNOSIS — G5603 Carpal tunnel syndrome, bilateral upper limbs: Secondary | ICD-10-CM

## 2018-04-07 NOTE — Procedures (Signed)
[facility]  [place], [place]  [place], [place] [address] Tel: [phone] Fax:  [phone] Test Date:  04/07/2018  Patient: Sabrina Knight DOB: November 04, 1950 Physician: Narda Amber, DO  Sex: Female Height: 5\' 9"  Ref Phys: Daryll Brod, MD  ID#: [id number] Temp: 34.0 Technician:    Patient Complaints: This is a 68 year old female referred for evaluation of bilateral hand numbness and tingling.  NCV & EMG Findings: Extensive electrodiagnostic testing of the right upper extremity and additional studies of the left shows:  1. Median sensory response is absent on the right and shows prolonged latency (6.9 ms) and reduced amplitude (9.3 V) on the left.  Bilateral ulnar sensory responses are within normal limits. 2. Bilateral median motor responses show prolonged latency (R8.7, L6.8 ms), worse on the right where there is reduced amplitude (4.8 mV). Of note, there is evidence of a right median-to-ulnar crossover in the forearm as seen by a motor response stimulating at the ulnar-wrist and recording at the abductor pollicis brevis muscle.  Bilateral ulnar motor responses are within normal limits. 3. Chronic motor axonal loss changes are seen affecting bilateral abductor pollicis brevis muscles, which is worse on the right.  There are no fibrillation potentials.  Impression: 1. Bilateral median neuropathy at or distal to the wrist, consistent with a clinical diagnosis of carpal tunnel syndrome, which is severe on the right and moderate on the left.  2. Incidentally, there is a right Martin-Gruber anastomosis, a normal anatomic variant.   ___________________________ Narda Amber, DO    Nerve Conduction Studies Anti Sensory Summary Table   Site NR Peak (ms) Norm Peak (ms) P-T Amp (V) Norm P-T Amp  Left Median Anti Sensory (2nd Digit)  34C  Wrist    6.9 <3.8 9.3 >10  Right Median Anti Sensory (2nd Digit)  34C  Wrist NR  <3.8  >10  Left Ulnar Anti Sensory (5th Digit)   34C  Wrist    2.9 <3.2 33.3 >5  Right Ulnar Anti Sensory (5th Digit)  34C  Wrist    3.0 <3.2 33.0 >5   Motor Summary Table   Site NR Onset (ms) Norm Onset (ms) O-P Amp (mV) Norm O-P Amp Site1 Site2 Delta-0 (ms) Dist (cm) Vel (m/s) Norm Vel (m/s)  Left Median Motor (Abd Poll Brev)  34C  Wrist    6.8 <4.0 6.7 >5 Elbow Wrist 5.4 28.0 52 >50  Elbow    12.2  6.1         Right Median Motor (Abd Poll Brev)  34C  Wrist    8.7 <4.0 4.8 >5 Elbow Wrist 5.3 27.0 51 >50  Elbow    14.0  4.1  Ulnar-wrist crossover Elbow 9.5 0.0    Ulnar-wrist crossover    4.5  3.8         Left Ulnar Motor (Abd Dig Minimi)  34C  Wrist    2.5 <3.1 9.7 >7 B Elbow Wrist 3.6 23.0 64 >50  B Elbow    6.1  9.4  A Elbow B Elbow 1.6 10.0 62 >50  A Elbow    7.7  9.0         Right Ulnar Motor (Abd Dig Minimi)  34C  Wrist    2.2 <3.1 8.8 >7 B Elbow Wrist 3.8 22.0 58 >50  B Elbow    6.0  8.0  A Elbow B Elbow 1.7 10.0 59 >50  A Elbow    7.7  7.9  EMG   Side Muscle Ins Act Fibs Psw Fasc Number Recrt Dur Dur. Amp Amp. Poly Poly. Comment  Right 1stDorInt Nml Nml Nml Nml Nml Nml Nml Nml Nml Nml Nml Nml N/A  Right Abd Poll Brev Nml Nml Nml Nml 2- Rapid Many 1+ Many 1+ Nml Nml N/A  Right PronatorTeres Nml Nml Nml Nml Nml Nml Nml Nml Nml Nml Nml Nml N/A  Right Biceps Nml Nml Nml Nml Nml Nml Nml Nml Nml Nml Nml Nml N/A  Right Triceps Nml Nml Nml Nml Nml Nml Nml Nml Nml Nml Nml Nml N/A  Right Deltoid Nml Nml Nml Nml Nml Nml Nml Nml Nml Nml Nml Nml N/A  Left 1stDorInt Nml Nml Nml Nml Nml Nml Nml Nml Nml Nml Nml Nml N/A  Left Abd Poll Brev Nml Nml Nml Nml 1- Rapid Some 1+ Some 1+ Nml Nml N/A  Left PronatorTeres Nml Nml Nml Nml Nml Nml Nml Nml Nml Nml Nml Nml N/A  Left Biceps Nml Nml Nml Nml Nml Nml Nml Nml Nml Nml Nml Nml N/A  Left Triceps Nml Nml Nml Nml Nml Nml Nml Nml Nml Nml Nml Nml N/A  Left Deltoid Nml Nml Nml Nml Nml Nml Nml Nml Nml Nml Nml Nml N/A      Waveforms:

## 2018-04-30 ENCOUNTER — Other Ambulatory Visit: Payer: Self-pay | Admitting: Family Medicine

## 2018-05-06 DIAGNOSIS — G5603 Carpal tunnel syndrome, bilateral upper limbs: Secondary | ICD-10-CM | POA: Diagnosis not present

## 2018-05-16 ENCOUNTER — Other Ambulatory Visit: Payer: Self-pay | Admitting: Family Medicine

## 2018-05-24 ENCOUNTER — Other Ambulatory Visit: Payer: Self-pay | Admitting: Family Medicine

## 2018-06-14 ENCOUNTER — Other Ambulatory Visit: Payer: Self-pay | Admitting: Family Medicine

## 2018-06-23 ENCOUNTER — Other Ambulatory Visit: Payer: Self-pay

## 2018-06-23 ENCOUNTER — Telehealth: Payer: Self-pay | Admitting: Family Medicine

## 2018-06-23 ENCOUNTER — Ambulatory Visit (INDEPENDENT_AMBULATORY_CARE_PROVIDER_SITE_OTHER): Payer: Medicare HMO | Admitting: Family Medicine

## 2018-06-23 ENCOUNTER — Encounter: Payer: Self-pay | Admitting: Family Medicine

## 2018-06-23 VITALS — BP 146/77 | HR 81 | Temp 98.4°F | Resp 16 | Ht 65.95 in | Wt 185.4 lb

## 2018-06-23 DIAGNOSIS — Z0001 Encounter for general adult medical examination with abnormal findings: Secondary | ICD-10-CM | POA: Diagnosis not present

## 2018-06-23 DIAGNOSIS — E7800 Pure hypercholesterolemia, unspecified: Secondary | ICD-10-CM | POA: Diagnosis not present

## 2018-06-23 DIAGNOSIS — R739 Hyperglycemia, unspecified: Secondary | ICD-10-CM

## 2018-06-23 DIAGNOSIS — Z794 Long term (current) use of insulin: Secondary | ICD-10-CM | POA: Diagnosis not present

## 2018-06-23 DIAGNOSIS — E1165 Type 2 diabetes mellitus with hyperglycemia: Secondary | ICD-10-CM | POA: Diagnosis not present

## 2018-06-23 DIAGNOSIS — E785 Hyperlipidemia, unspecified: Secondary | ICD-10-CM | POA: Diagnosis not present

## 2018-06-23 DIAGNOSIS — Z789 Other specified health status: Secondary | ICD-10-CM | POA: Diagnosis not present

## 2018-06-23 DIAGNOSIS — Z0000 Encounter for general adult medical examination without abnormal findings: Secondary | ICD-10-CM

## 2018-06-23 LAB — POCT GLYCOSYLATED HEMOGLOBIN (HGB A1C): Hemoglobin A1C: 6.5 % — AB (ref 4.0–5.6)

## 2018-06-23 NOTE — Progress Notes (Signed)
QUICK REFERENCE INFORMATION: The ABCs of Providing the Annual Wellness Visit  CMS.gov Medicare Learning Network  [employer] Annual Wellness Visit  Subjective:   Sabrina Knight is a 68 y.o. Female who presents for an Annual Wellness Visit.     Patient Active Problem List   Diagnosis Date Noted  . Dyslipidemia 10/22/2017  . Statin intolerance 10/22/2017  . Newly diagnosed diabetes ([place]) 10/22/2017  . Gout 12/23/2016  . Class 1 obesity due to excess calories with serious comorbidity and body mass index (BMI) of 31.0 to 31.9 in adult 05/19/2016  . Osteopenia of necks of both femurs 05/19/2016  . Thrombocytosis ([place]) 02/08/2014  . High cholesterol 02/01/2013  . H/O splenectomy 02/01/2013  . Non-allergic rhinitis 02/01/2013  . Glaucoma 02/01/2013    Past Medical History:  Diagnosis Date  . Allergy    Zyrtec, Singulair; allergy testing negative; Orvil Feil.  . Cataract   . Clotting disorder ([place])   . Glaucoma   . ITP (idiopathic thrombocytopenic purpura)      Past Surgical History:  Procedure Laterality Date  . ABDOMINAL HYSTERECTOMY  01/22/1999   ovaries resected; DUB/fibroids  . 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 spleen removal       Outpatient Medications Prior to Visit  Medication Sig Dispense Refill  . allopurinol (ZYLOPRIM) 100 MG tablet TAKE 1 TABLET BY MOUTH TWICE A DAY 60 tablet 0  . montelukast (SINGULAIR) 10 MG tablet Take 10 mg by mouth daily.    Marland Kitchen PRESCRIPTION MEDICATION Zyrtec 10 mg taking daily    . Alirocumab (PRALUENT) 75 MG/ML SOPN Inject 75 mg into the skin every 14 (fourteen) days. (Patient not taking: Reported on 06/23/2018) 2 mL 6  . brimonidine (ALPHAGAN) 0.2 % ophthalmic solution 1 DROP INTO RIGHT EYE TWICE A DAY START 48 HOURS PRIOR TO SURGERY    . carboxymethylcellulose (REFRESH PLUS) 0.5 % SOLN Apply 1 drop to eye 3 (three) times daily as needed (for dry eyes).    . colchicine 0.6 MG tablet TAKE 1 TABLET (0.6 MG TOTAL) BY MOUTH 2 (TWO) TIMES DAILY  AS NEEDED. (Patient not taking: Reported on 06/23/2018) 30 tablet 2  . fexofenadine (ALLEGRA) 180 MG tablet TAKE 1 TABLET BY MOUTH EVERY DAY    . fluticasone (FLONASE) 50 MCG/ACT nasal spray Place 1 spray into both nostrils 2 (two) times daily. (Patient not taking: Reported on 06/23/2018) 48 g 3  . Fluticasone Furoate (ARNUITY ELLIPTA) 100 MCG/ACT AEPB INHALE ONCE A DAY    . GAVILYTE-N WITH FLAVOR PACK 420 g solution See admin instructions.  0  . Multiple Vitamin (MULTIVITAMIN WITH MINERALS) TABS tablet Take 1 tablet by mouth daily.    . Olopatadine HCl 0.6 % SOLN Place 1 spray into the nose daily.    . pravastatin (PRAVACHOL) 20 MG tablet TAKE 1 TABLET BY MOUTH EVERY DAY (Patient not taking: Reported on 06/23/2018) 90 tablet 1  . PRESCRIPTION MEDICATION Patonase nasal spray 665 mg taking daily     No facility-administered medications prior to visit.     No Known Allergies   Family History  Problem Relation Age of Onset  . Cancer Mother 17       lung cancer  . Hypertension Mother   . Hyperlipidemia Mother   . Mental illness Mother   . Hypertension Sister   . Hypertension Brother   . Diabetes Brother   . Cancer Brother 17       bladder cancer; non-smoker  . Heart disease Brother  63       CABG/CAD  . Hypertension Sister   . Diabetes Sister   . Hypertension Brother   . Diabetes Brother   . Hypertension Brother   . Diabetes Brother   . Hypertension Brother   . COPD Father      Social History   Socioeconomic History  . Marital status: Widowed    Spouse name: Not on file  . Number of children: 0  . Years of education: Not on file  . Highest education level: Not on file  Occupational History  . Occupation: Retired  Scientific laboratory technician  . Financial resource strain: Not on file  . Food insecurity:    Worry: Not on file    Inability: Not on file  . Transportation needs:    Medical: Not on file    Non-medical: Not on file  Tobacco Use  . Smoking status: Former Smoker     Packs/day: 0.50    Last attempt to quit: 12/03/2013    Years since quitting: 4.5  . Smokeless tobacco: Never Used  Substance and Sexual Activity  . Alcohol use: No    Alcohol/week: 0.0 standard drinks  . Drug use: No  . Sexual activity: Not Currently    Birth control/protection: Post-menopausal, Surgical  Lifestyle  . Physical activity:    Days per week: Not on file    Minutes per session: Not on file  . Stress: Not on file  Relationships  . Social connections:    Talks on phone: Not on file    Gets together: Not on file    Attends religious service: Not on file    Active member of club or organization: Not on file    Attends meetings of clubs or organizations: Not on file    Relationship status: Not on file  Other Topics Concern  . Not on file  Social History Narrative   Marital status:  Widowed x since 2001 from [place]; married x 27 years.  Not dating really in 2019.      Children:  None; failed in vitro.      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      Tobacco: quit ; smoked 20 years.      Alcohol: none      Education: [employer].       Exercise: Yes; 3 times per week 2 hours deep water aerobics.      ADLs: independent ADLs; no assistant devices.      Advanced Directives:  None; FULL CODE; no prolonged measures.    HCPOA: Darlene Jackson/sister-in-law.        Recent Hospitalizations? No  Health Habits: Current exercise activities include: none Exercise: 0 times/week.  She plans to go back to the gym when it opens to do water aerobics Diet: in general, a "healthy" diet    Alcohol intake: none  Health Risk Assessment: The patient has completed a Health Risk Assessment. This has been reveiwed with them and has been scanned into the [facility] system as an attached document.  Current Medical Providers and Suppliers: [facility] Patient Care Team: Forrest Moron, MD as PCP - General (Internal Medicine) No future  appointments.   Age-appropriate Screening Schedule: The list below includes current immunization status and future screening recommendations based on patient's age. Orders for these recommended tests are listed in the plan section. The patient has been provided with a written plan. Immunization History  Administered Date(s)  Administered  . Influenza,inj,Quad PF,6+ Mos 10/08/2016  . Pneumococcal Conjugate-13 10/08/2016  . Pneumococcal Polysaccharide-23 10/20/2017  . Td 10/08/2016  . Zoster 06/29/2010    Health Maintenance reviewed -  patient asked to schedule her mammogram   Depression Screen-PHQ2/9 completed today  Depression screen [facility] 2/9 06/23/2018 10/20/2017 04/07/2017 02/28/2017 12/23/2016  Decreased Interest 0 0 0 0 0  Down, Depressed, Hopeless 0 0 0 0 0  PHQ - 2 Score 0 0 0 0 0       Depression Severity and Treatment Recommendations:  0-4= None  5-9= Mild / Treatment: Support, educate to call if worse; return in one month  10-14= Moderate / Treatment: Support, watchful waiting; Antidepressant or Psycotherapy  15-19= Moderately severe / Treatment: Antidepressant OR Psychotherapy  >= 20 = Major depression, severe / Antidepressant AND Psychotherapy  Functional Status Survey: Is the patient deaf or have difficulty hearing?: Yes Does the patient have difficulty seeing, even when wearing glasses/contacts?: No Does the patient have difficulty concentrating, remembering, or making decisions?: No Does the patient have difficulty walking or climbing stairs?: No Does the patient have difficulty dressing or bathing?: No Does the patient have difficulty doing errands alone such as visiting a doctor's office or shopping?: No   Hearing Evaluation: 1. Do you have trouble hearing the television when others do not?   No 2. Do you have to strain to hear/understand conversations? No   Advanced Care Planning: 1. Patient has executed an Advance Directive: Yes 2. If no, patient was given  the opportunity to execute an Advance Directive today? Yes 3. Are the patient's advanced directives in [place]? No 4. This patient has the ability to prepare an Advance Directive: Yes 5. Provider is willing to follow the patient's wishes: Yes  Cognitive Assessment: Does the patient have evidence of cognitive impairment? No The patient does not have any evidence of any cognitive problems and denies any  change in mood/affect, appearance, speech, memory or motor skills.  Identification of Risk Factors: Risk factors include: diabetes mellitus and hyperlipidemia  ROS  Review of Systems  Constitutional: Negative for activity change, appetite change, chills and fever.  HENT: Negative for congestion, nosebleeds, trouble swallowing and voice change.   Respiratory: Negative for cough, shortness of breath and wheezing.   Gastrointestinal: Negative for diarrhea, nausea and vomiting.  Genitourinary: Negative for difficulty urinating, dysuria, flank pain and hematuria.  Musculoskeletal: Negative for back pain, joint swelling and neck pain.  Neurological: Negative for dizziness, speech difficulty, light-headedness and numbness.  See HPI. All other review of systems negative.    Objective:   Vitals:   06/23/18 1336  BP: (!) 146/77  Pulse: 81  Resp: 16  Temp: 98.4 F (36.9 C)  TempSrc: Oral  SpO2: 95%  Weight: 185 lb 6.4 oz (84.1 kg)  Height: 5' 5.95" (1.675 m)   Body mass index is 29.97 kg/m.  Wt Readings from Last 3 Encounters:  06/23/18 185 lb 6.4 oz (84.1 kg)  10/20/17 190 lb (86.2 kg)  04/07/17 187 lb (84.8 kg)    Body mass index is 29.97 kg/m.  Physical Exam  Constitutional: Oriented to person, place, and time. Appears well-developed and well-nourished.  HENT:  Head: Normocephalic and atraumatic.  Eyes: Conjunctivae and EOM are normal.  Ears: normal TM, external canal normal Breast:  Symmetric, no axillary lymphadenopathy, no nipple discharge, no skin changes, no  palpable masses Cardiovascular: Normal rate, regular rhythm, normal heart sounds and intact distal pulses.  No murmur heard. Pulmonary/Chest: Effort  normal and breath sounds normal. No stridor. No respiratory distress. Has no wheezes.  Abdomen: nondistended, normoactive bs, soft, nontender Neurological: Is alert and oriented to person, place, and time.  Skin: Skin is warm. Capillary refill takes less than 2 seconds.  Psychiatric: Has a normal mood and affect. Behavior is normal. Judgment and thought content normal.     Assessment/Plan:   Patient Self-Management and Personalized Health Advice The patient has been provided with information about:  attempt to lose weight, use calcium 1 gram daily with Vit D, continue current medications, continue current healthy lifestyle patterns and return for routine annual checkups  During the course of the visit the patient was educated and counseled about appropriate screening and preventive services including:   lab testing as noted in orders section, return annually or prn     Body mass index is 29.97 kg/m. Discussed the patient's BMI with her.   Lenice was seen today for annual exam.  Diagnoses and all orders for this visit:  Encounter for Medicare annual wellness exam- reviewed screenings and vaccinations Reviewed cancer screenings  Type 2 diabetes mellitus with hyperglycemia, with long-term current use of insulin ([place]) well controlled hemoglobin a1c is at goal Continue exercise Lipids monitored and renal function in range On statin, continue Reviewed diabetic foot care Emphasized importance of eye and dental exam    -     POCT glycosylated hemoglobin (Hb A1C) -     Microalbumin, urine  Dyslipidemia -     Lipid panel  High cholesterol -     Lipid panel  Hyperglycemia -     Comprehensive metabolic panel      No follow-ups on file.  No future appointments.  Patient Instructions       If you have lab work done  today you will be contacted with your lab results within the next 2 weeks.  If you have not heard from [place] then please contact us. The fastest way to get your results is to register for My Chart.   IF you received an x-ray today, you will receive an invoice from [facility]. Please contact [facility] at [phone] with questions or concerns regarding your invoice.   IF you received labwork today, you will receive an invoice from [facility]. Please contact [facility] at [phone] with questions or concerns regarding your invoice.   Our billing staff will not be able to assist you with questions regarding bills from these companies.  You will be contacted with the lab results as soon as they are available. The fastest way to get your results is to activate your My Chart account. Instructions are located on the last page of this paperwork. If you have not heard from [place] regarding the results in 2 weeks, please contact this office.       An after visit summary with all of these plans was given to the patient.

## 2018-06-23 NOTE — Telephone Encounter (Signed)
Called pt to advise of appointment.

## 2018-06-23 NOTE — Patient Instructions (Signed)
° ° ° °  If you have lab work done today you will be contacted with your lab results within the next 2 weeks.  If you have not heard from us then please contact us. The fastest way to get your results is to register for My Chart. ° ° °IF you received an x-ray today, you will receive an invoice from [facility]. Please contact [facility] at [phone] with questions or concerns regarding your invoice.  ° °IF you received labwork today, you will receive an invoice from [facility]. Please contact [facility] at [phone] with questions or concerns regarding your invoice.  ° °Our billing staff will not be able to assist you with questions regarding bills from these companies. ° °You will be contacted with the lab results as soon as they are available. The fastest way to get your results is to activate your My Chart account. Instructions are located on the last page of this paperwork. If you have not heard from us regarding the results in 2 weeks, please contact this office. °  ° ° ° °

## 2018-06-23 NOTE — Telephone Encounter (Signed)
Copied from [place] [id number]. Topic: General - Call Back - No Documentation >> Jun 23, 2018  4:22 PM Erick Blinks wrote: Reason for CRM: Call back request. [phone]

## 2018-06-24 LAB — COMPREHENSIVE METABOLIC PANEL
ALT: 33 IU/L — ABNORMAL HIGH (ref 0–32)
AST: 26 IU/L (ref 0–40)
Albumin/Globulin Ratio: 1.8 (ref 1.2–2.2)
Albumin: 4.4 g/dL (ref 3.8–4.8)
Alkaline Phosphatase: 91 IU/L (ref 39–117)
BUN/Creatinine Ratio: 18 (ref 12–28)
BUN: 14 mg/dL (ref 8–27)
Bilirubin Total: 0.3 mg/dL (ref 0.0–1.2)
CO2: 20 mmol/L (ref 20–29)
Calcium: 9.7 mg/dL (ref 8.7–10.3)
Chloride: 102 mmol/L (ref 96–106)
Creatinine, Ser: 0.8 mg/dL (ref 0.57–1.00)
GFR calc Af Amer: 88 mL/min/{1.73_m2} (ref >59)
GFR calc non Af Amer: 76 mL/min/{1.73_m2} (ref >59)
Globulin, Total: 2.5 g/dL (ref 1.5–4.5)
Glucose: 89 mg/dL (ref 65–99)
Potassium: 4.2 mmol/L (ref 3.5–5.2)
Sodium: 141 mmol/L (ref 134–144)
Total Protein: 6.9 g/dL (ref 6.0–8.5)

## 2018-06-24 LAB — LIPID PANEL
Chol/HDL Ratio: 4.9 ratio — ABNORMAL HIGH (ref 0.0–4.4)
Cholesterol, Total: 245 mg/dL — ABNORMAL HIGH (ref 100–199)
HDL: 50 mg/dL (ref >39)
LDL Calculated: 154 mg/dL — ABNORMAL HIGH (ref 0–99)
Triglycerides: 206 mg/dL — ABNORMAL HIGH (ref 0–149)
VLDL Cholesterol Cal: 41 mg/dL — ABNORMAL HIGH (ref 5–40)

## 2018-06-24 LAB — MICROALBUMIN, URINE: Microalbumin, Urine: 3.7 ug/mL

## 2018-06-30 NOTE — Telephone Encounter (Signed)
Saw Dr. Nolon Rod 06/2018

## 2018-07-02 ENCOUNTER — Telehealth: Payer: Self-pay | Admitting: Family Medicine

## 2018-07-02 NOTE — Telephone Encounter (Signed)
Copied from [place] [id number]. Topic: General - Other >> Jul 02, 2018  9:54 AM [name] wrote: Reason for CRM:   Patient would like to have a hard copy of her recent lab results sent to her. >> Jul 02, 2018  9:59 AM [name] wrote: Patient has deactivated her mychart account because she doesn't know how to use it.  So she will need for someone to call her on her cellphone with the results.

## 2018-07-03 NOTE — Telephone Encounter (Signed)
Labs was printed and mailed to pt.

## 2018-07-04 MED ORDER — ROSUVASTATIN CALCIUM 10 MG PO TABS: 10.0000 mg | ORAL_TABLET | ORAL | 1 refills | Status: DC

## 2018-07-07 ENCOUNTER — Other Ambulatory Visit: Payer: Self-pay | Admitting: Family Medicine

## 2018-07-07 DIAGNOSIS — H04123 Dry eye syndrome of bilateral lacrimal glands: Secondary | ICD-10-CM | POA: Diagnosis not present

## 2018-07-07 DIAGNOSIS — H16223 Keratoconjunctivitis sicca, not specified as Sjogren's, bilateral: Secondary | ICD-10-CM | POA: Diagnosis not present

## 2018-07-07 DIAGNOSIS — H40013 Open angle with borderline findings, low risk, bilateral: Secondary | ICD-10-CM | POA: Diagnosis not present

## 2018-07-07 DIAGNOSIS — H1045 Other chronic allergic conjunctivitis: Secondary | ICD-10-CM | POA: Diagnosis not present

## 2018-09-07 ENCOUNTER — Other Ambulatory Visit: Payer: Self-pay

## 2018-09-07 ENCOUNTER — Other Ambulatory Visit: Payer: Self-pay | Admitting: Orthopedic Surgery

## 2018-09-07 ENCOUNTER — Encounter (HOSPITAL_BASED_OUTPATIENT_CLINIC_OR_DEPARTMENT_OTHER): Payer: Self-pay | Admitting: *Deleted

## 2018-09-07 DIAGNOSIS — G5603 Carpal tunnel syndrome, bilateral upper limbs: Secondary | ICD-10-CM | POA: Diagnosis not present

## 2018-09-07 DIAGNOSIS — M180 Bilateral primary osteoarthritis of first carpometacarpal joints: Secondary | ICD-10-CM | POA: Diagnosis not present

## 2018-09-08 ENCOUNTER — Other Ambulatory Visit (HOSPITAL_COMMUNITY)
Admission: RE | Admit: 2018-09-08 | Discharge: 2018-09-08 | Disposition: A | Discharge status: Home / Self Care | Payer: Medicare HMO | Source: Ambulatory Visit | Attending: Orthopedic Surgery | Admitting: Orthopedic Surgery

## 2018-09-08 DIAGNOSIS — Z01812 Encounter for preprocedural laboratory examination: Secondary | ICD-10-CM | POA: Diagnosis not present

## 2018-09-08 DIAGNOSIS — Z20828 Contact with and (suspected) exposure to other viral communicable diseases: Secondary | ICD-10-CM | POA: Diagnosis not present

## 2018-09-08 LAB — SARS CORONAVIRUS 2 (TAT 6-24 HRS): SARS Coronavirus 2: NEGATIVE

## 2018-09-08 NOTE — Progress Notes (Signed)

## 2018-09-10 NOTE — Anesthesia Preprocedure Evaluation (Addendum)
Anesthesia Evaluation  Patient identified by MRN, date of birth, ID band Patient awake    Reviewed: Allergy & Precautions, NPO status , Patient's Chart, lab work & pertinent test results  Airway Mallampati: II  TM Distance: >3 FB Neck ROM: Full    Dental no notable dental hx.    Pulmonary former smoker,    Pulmonary exam normal breath sounds clear to auscultation       Cardiovascular negative cardio ROS Normal cardiovascular exam Rhythm:Regular Rate:Normal     Neuro/Psych negative neurological ROS  negative psych ROS   GI/Hepatic negative GI ROS, Neg liver ROS,   Endo/Other  negative endocrine ROS  Renal/GU negative Renal ROS     Musculoskeletal Gout    Abdominal   Peds  Hematology HLD   Anesthesia Other Findings   Reproductive/Obstetrics                            Anesthesia Physical Anesthesia Plan  ASA: II  Anesthesia Plan: Bier Block and Bier Block-LIDOCAINE ONLY   Post-op Pain Management:    Induction: Intravenous  PONV Risk Score and Plan: 2 and Ondansetron, Dexamethasone, Midazolam and Treatment may vary due to age or medical condition  Airway Management Planned: Simple Face Mask  Additional Equipment:   Intra-op Plan:   Post-operative Plan:   Informed Consent: I have reviewed the patients History and Physical, chart, labs and discussed the procedure including the risks, benefits and alternatives for the proposed anesthesia with the patient or authorized representative who has indicated his/her understanding and acceptance.     Dental advisory given  Plan Discussed with: CRNA  Anesthesia Plan Comments:         Anesthesia Quick Evaluation

## 2018-09-11 ENCOUNTER — Encounter (HOSPITAL_BASED_OUTPATIENT_CLINIC_OR_DEPARTMENT_OTHER): Payer: Self-pay

## 2018-09-11 ENCOUNTER — Encounter (HOSPITAL_BASED_OUTPATIENT_CLINIC_OR_DEPARTMENT_OTHER)
Admission: RE | Disposition: A | Discharge status: Home / Self Care | Payer: Self-pay | Source: Home / Self Care | Attending: Orthopedic Surgery

## 2018-09-11 ENCOUNTER — Ambulatory Visit (HOSPITAL_BASED_OUTPATIENT_CLINIC_OR_DEPARTMENT_OTHER): Payer: Medicare HMO | Admitting: Certified Registered"

## 2018-09-11 ENCOUNTER — Ambulatory Visit (HOSPITAL_BASED_OUTPATIENT_CLINIC_OR_DEPARTMENT_OTHER)
Admission: RE | Admit: 2018-09-11 | Discharge: 2018-09-11 | Disposition: A | Discharge status: Home / Self Care | Payer: Medicare HMO | Attending: Orthopedic Surgery | Admitting: Orthopedic Surgery

## 2018-09-11 ENCOUNTER — Other Ambulatory Visit: Payer: Self-pay

## 2018-09-11 DIAGNOSIS — G5603 Carpal tunnel syndrome, bilateral upper limbs: Secondary | ICD-10-CM | POA: Diagnosis present

## 2018-09-11 DIAGNOSIS — G5601 Carpal tunnel syndrome, right upper limb: Secondary | ICD-10-CM | POA: Diagnosis not present

## 2018-09-11 DIAGNOSIS — D689 Coagulation defect, unspecified: Secondary | ICD-10-CM | POA: Insufficient documentation

## 2018-09-11 DIAGNOSIS — H409 Unspecified glaucoma: Secondary | ICD-10-CM | POA: Insufficient documentation

## 2018-09-11 DIAGNOSIS — M180 Bilateral primary osteoarthritis of first carpometacarpal joints: Secondary | ICD-10-CM | POA: Insufficient documentation

## 2018-09-11 DIAGNOSIS — Z87891 Personal history of nicotine dependence: Secondary | ICD-10-CM | POA: Diagnosis not present

## 2018-09-11 DIAGNOSIS — H269 Unspecified cataract: Secondary | ICD-10-CM | POA: Diagnosis not present

## 2018-09-11 DIAGNOSIS — D696 Thrombocytopenia, unspecified: Secondary | ICD-10-CM | POA: Diagnosis not present

## 2018-09-11 DIAGNOSIS — J45909 Unspecified asthma, uncomplicated: Secondary | ICD-10-CM | POA: Insufficient documentation

## 2018-09-11 DIAGNOSIS — E785 Hyperlipidemia, unspecified: Secondary | ICD-10-CM | POA: Diagnosis not present

## 2018-09-11 DIAGNOSIS — E119 Type 2 diabetes mellitus without complications: Secondary | ICD-10-CM | POA: Diagnosis not present

## 2018-09-11 DIAGNOSIS — D693 Immune thrombocytopenic purpura: Secondary | ICD-10-CM | POA: Insufficient documentation

## 2018-09-11 DIAGNOSIS — M109 Gout, unspecified: Secondary | ICD-10-CM | POA: Insufficient documentation

## 2018-09-11 HISTORY — PX: CARPAL TUNNEL RELEASE: SHX101

## 2018-09-11 HISTORY — DX: Unspecified asthma, uncomplicated: J45.909

## 2018-09-11 HISTORY — DX: Gout, unspecified: M10.9

## 2018-09-11 SURGERY — CARPAL TUNNEL RELEASE
Anesthesia: Regional | Site: Wrist | Laterality: Right

## 2018-09-11 MED ORDER — ROCURONIUM BROMIDE 10 MG/ML (PF) SYRINGE
PREFILLED_SYRINGE | INTRAVENOUS | Status: AC
  Filled 2018-09-11: qty 20

## 2018-09-11 MED ORDER — MIDAZOLAM HCL 2 MG/2ML IJ SOLN
INTRAMUSCULAR | Status: AC
  Filled 2018-09-11: qty 2

## 2018-09-11 MED ORDER — ONDANSETRON HCL 4 MG/2ML IJ SOLN
INTRAMUSCULAR | Status: AC
  Filled 2018-09-11: qty 6

## 2018-09-11 MED ORDER — KETOROLAC TROMETHAMINE 30 MG/ML IJ SOLN
INTRAMUSCULAR | Status: AC
  Filled 2018-09-11: qty 1

## 2018-09-11 MED ORDER — DEXAMETHASONE SODIUM PHOSPHATE 10 MG/ML IJ SOLN
INTRAMUSCULAR | Status: AC
  Filled 2018-09-11: qty 2

## 2018-09-11 MED ORDER — ACETAMINOPHEN 500 MG PO TABS
ORAL_TABLET | ORAL | Status: AC
  Filled 2018-09-11: qty 2

## 2018-09-11 MED ORDER — BUPIVACAINE HCL (PF) 0.25 % IJ SOLN
INTRAMUSCULAR | Status: DC | PRN
  Administered 2018-09-11: 6 mL

## 2018-09-11 MED ORDER — EPHEDRINE 5 MG/ML INJ
INTRAVENOUS | Status: AC
  Filled 2018-09-11: qty 20

## 2018-09-11 MED ORDER — MIDAZOLAM HCL 2 MG/2ML IJ SOLN
1.0000 mg | INTRAMUSCULAR | Status: DC | PRN
  Administered 2018-09-11: 1 mg via INTRAVENOUS

## 2018-09-11 MED ORDER — LACTATED RINGERS IV SOLN
INTRAVENOUS | Status: DC
  Administered 2018-09-11: 13:00:00 via INTRAVENOUS

## 2018-09-11 MED ORDER — ONDANSETRON HCL 4 MG/2ML IJ SOLN
INTRAMUSCULAR | Status: DC | PRN
  Administered 2018-09-11: 4 mg via INTRAVENOUS

## 2018-09-11 MED ORDER — PROPOFOL 500 MG/50ML IV EMUL
INTRAVENOUS | Status: AC
  Filled 2018-09-11: qty 50

## 2018-09-11 MED ORDER — CEFAZOLIN SODIUM-DEXTROSE 2-4 GM/100ML-% IV SOLN
2.0000 g | INTRAVENOUS | Status: AC
  Administered 2018-09-11: 2 g via INTRAVENOUS

## 2018-09-11 MED ORDER — CHLORHEXIDINE GLUCONATE 4 % EX LIQD: 60.0000 mL | Freq: Once | CUTANEOUS | Status: DC

## 2018-09-11 MED ORDER — ONDANSETRON HCL 4 MG/2ML IJ SOLN: 4.0000 mg | Freq: Once | INTRAMUSCULAR | Status: DC | PRN

## 2018-09-11 MED ORDER — SCOPOLAMINE 1 MG/3DAYS TD PT72: 1.0000 | MEDICATED_PATCH | Freq: Once | TRANSDERMAL | Status: DC

## 2018-09-11 MED ORDER — PROPOFOL 500 MG/50ML IV EMUL
INTRAVENOUS | Status: DC | PRN
  Administered 2018-09-11: 75 ug/kg/min via INTRAVENOUS

## 2018-09-11 MED ORDER — FENTANYL CITRATE (PF) 100 MCG/2ML IJ SOLN: 25.0000 ug | INTRAMUSCULAR | Status: DC | PRN

## 2018-09-11 MED ORDER — FENTANYL CITRATE (PF) 100 MCG/2ML IJ SOLN
INTRAMUSCULAR | Status: AC
  Filled 2018-09-11: qty 2

## 2018-09-11 MED ORDER — CEFAZOLIN SODIUM-DEXTROSE 2-4 GM/100ML-% IV SOLN
INTRAVENOUS | Status: AC
  Filled 2018-09-11: qty 100

## 2018-09-11 MED ORDER — TRAMADOL HCL 50 MG PO TABS: 50.0000 mg | ORAL_TABLET | Freq: Four times a day (QID) | ORAL | 0 refills | Status: DC | PRN

## 2018-09-11 MED ORDER — 0.9 % SODIUM CHLORIDE (POUR BTL) OPTIME
TOPICAL | Status: DC | PRN
  Administered 2018-09-11: 14:00:00 200 mL

## 2018-09-11 MED ORDER — FENTANYL CITRATE (PF) 100 MCG/2ML IJ SOLN
50.0000 ug | INTRAMUSCULAR | Status: DC | PRN
  Administered 2018-09-11: 50 ug via INTRAVENOUS

## 2018-09-11 MED ORDER — ACETAMINOPHEN 500 MG PO TABS
1000.0000 mg | ORAL_TABLET | Freq: Once | ORAL | Status: AC
  Administered 2018-09-11: 13:00:00 1000 mg via ORAL

## 2018-09-11 MED ORDER — PHENYLEPHRINE 40 MCG/ML (10ML) SYRINGE FOR IV PUSH (FOR BLOOD PRESSURE SUPPORT)
PREFILLED_SYRINGE | INTRAVENOUS | Status: AC
  Filled 2018-09-11: qty 10

## 2018-09-11 MED ORDER — LIDOCAINE 2% (20 MG/ML) 5 ML SYRINGE
INTRAMUSCULAR | Status: AC
  Filled 2018-09-11: qty 5

## 2018-09-11 SURGICAL SUPPLY — 35 items
APL PRP STRL LF DISP 70% ISPRP (MISCELLANEOUS) ×1
BLADE SURG 15 STRL LF DISP TIS (BLADE) ×1 IMPLANT
BLADE SURG 15 STRL SS (BLADE) ×2
BNDG CMPR 9X4 STRL LF SNTH (GAUZE/BANDAGES/DRESSINGS)
BNDG COHESIVE 3X5 TAN STRL LF (GAUZE/BANDAGES/DRESSINGS) ×2 IMPLANT
BNDG ESMARK 4X9 LF (GAUZE/BANDAGES/DRESSINGS) IMPLANT
BNDG GAUZE ELAST 4 BULKY (GAUZE/BANDAGES/DRESSINGS) ×2 IMPLANT
CHLORAPREP W/TINT 26 (MISCELLANEOUS) ×2 IMPLANT
CORD BIPOLAR FORCEPS 12FT (ELECTRODE) ×2 IMPLANT
COVER BACK TABLE REUSABLE LG (DRAPES) ×2 IMPLANT
COVER MAYO STAND REUSABLE (DRAPES) ×2 IMPLANT
COVER WAND RF STERILE (DRAPES) IMPLANT
CUFF TOURN SGL QUICK 18X4 (TOURNIQUET CUFF) ×2 IMPLANT
DRAPE EXTREMITY T 121X128X90 (DISPOSABLE) ×2 IMPLANT
DRAPE SURG 17X23 STRL (DRAPES) ×2 IMPLANT
DRSG PAD ABDOMINAL 8X10 ST (GAUZE/BANDAGES/DRESSINGS) ×2 IMPLANT
GAUZE SPONGE 4X4 12PLY STRL (GAUZE/BANDAGES/DRESSINGS) ×2 IMPLANT
GAUZE XEROFORM 1X8 LF (GAUZE/BANDAGES/DRESSINGS) ×2 IMPLANT
GLOVE BIOGEL PI IND STRL 8.5 (GLOVE) ×1 IMPLANT
GLOVE BIOGEL PI INDICATOR 8.5 (GLOVE) ×1
GLOVE SURG ORTHO 8.0 STRL STRW (GLOVE) ×2 IMPLANT
GOWN STRL REUS W/ TWL LRG LVL3 (GOWN DISPOSABLE) ×1 IMPLANT
GOWN STRL REUS W/TWL LRG LVL3 (GOWN DISPOSABLE) ×2
GOWN STRL REUS W/TWL XL LVL3 (GOWN DISPOSABLE) ×2 IMPLANT
NDL PRECISIONGLIDE 27X1.5 (NEEDLE) IMPLANT
NEEDLE PRECISIONGLIDE 27X1.5 (NEEDLE) IMPLANT
NS IRRIG 1000ML POUR BTL (IV SOLUTION) ×2 IMPLANT
PACK BASIN DAY SURGERY FS (CUSTOM PROCEDURE TRAY) ×2 IMPLANT
STOCKINETTE 4X48 STRL (DRAPES) ×2 IMPLANT
SUT ETHILON 4 0 PS 2 18 (SUTURE) ×2 IMPLANT
SUT VICRYL 4-0 PS2 18IN ABS (SUTURE) IMPLANT
SYR BULB 3OZ (MISCELLANEOUS) ×2 IMPLANT
SYR CONTROL 10ML LL (SYRINGE) IMPLANT
TOWEL GREEN STERILE FF (TOWEL DISPOSABLE) ×2 IMPLANT
UNDERPAD 30X30 (UNDERPADS AND DIAPERS) ×2 IMPLANT

## 2018-09-11 NOTE — Anesthesia Postprocedure Evaluation (Signed)
Anesthesia Post Note  Patient: Sabrina Knight  Procedure(s) Performed: CARPAL TUNNEL RELEASE (Right Wrist)     Patient location during evaluation: PACU Anesthesia Type: Bier Block Level of consciousness: awake and alert Pain management: pain level controlled Vital Signs Assessment: post-procedure vital signs reviewed and stable Respiratory status: spontaneous breathing, nonlabored ventilation, respiratory function stable and patient connected to nasal cannula oxygen Cardiovascular status: stable and blood pressure returned to baseline Postop Assessment: no apparent nausea or vomiting Anesthetic complications: no    Last Vitals:  Vitals:   09/11/18 1455 09/11/18 1517  BP: 117/65 139/75  Pulse: 71 79  Resp: 18 18  Temp:  36.6 C  SpO2: 95% 95%    Last Pain:  Vitals:   09/11/18 1517  TempSrc:   PainSc: 0-No pain                 Ryan P Ellender

## 2018-09-11 NOTE — Anesthesia Procedure Notes (Signed)
Anesthesia Regional Block: Bier block (IV Regional)   Pre-Anesthetic Checklist: ,, timeout performed, Correct Patient, Correct Site, Correct Laterality, Correct Procedure, Correct Position, site marked, Risks and benefits discussed, Surgical consent,  Pre-op evaluation,  At surgeon's request  Laterality: Right  Prep: Maximum Sterile Barrier Precautions used, alcohol swabs        Procedures:,,,,, intact distal pulses, Esmarch exsanguination, single tourniquet utilized,  Narrative:

## 2018-09-11 NOTE — Transfer of Care (Signed)
Immediate Anesthesia Transfer of Care Note  Patient: Sabrina Knight  Procedure(s) Performed: CARPAL TUNNEL RELEASE (Right Wrist)  Patient Location: PACU  Anesthesia Type:MAC and Bier block  Level of Consciousness: awake, alert  and oriented  Airway & Oxygen Therapy: Patient Spontanous Breathing and Patient connected to face mask oxygen  Post-op Assessment: Report given to RN and Post -op Vital signs reviewed and stable  Post vital signs: Reviewed and stable  Last Vitals:  Vitals Value Taken Time  BP 122/54 09/11/18 1440  Temp    Pulse 71 09/11/18 1443  Resp 15 09/11/18 1443  SpO2 93 % 09/11/18 1443  Vitals shown include unvalidated device data.  Last Pain:  Vitals:   09/11/18 1311  TempSrc: Oral  PainSc: 0-No pain         Complications: No apparent anesthesia complications

## 2018-09-11 NOTE — Discharge Instructions (Addendum)
[facility] Instructions Hand Surgery  Wound Care: Keep your hand elevated above the level of your heart.  Do not allow it to dangle by your side.  Keep the dressing dry and do not remove it unless your doctor advises you to do so.  He will usually change it at the time of your post-op visit.  Moving your fingers is advised to stimulate circulation but will depend on the site of your surgery.  If you have a splint applied, your doctor will advise you regarding movement.  Activity: Do not drive or operate machinery today.  Rest today and then you may return to your normal activity and work as indicated by your physician.  Diet:  Drink liquids today or eat a light diet.  You may resume a regular diet tomorrow.    General expectations: Pain for two to three days. Fingers may become slightly swollen.  Call your doctor if any of the following occur: Severe pain not relieved by pain medication. Elevated temperature. Dressing soaked with blood. Inability to move fingers. White or bluish color to fingers.  No Tylenol until 7:30pm if needed   Post Anesthesia Home Care Instructions  Activity: Get plenty of rest for the remainder of the day. A responsible individual must stay with you for 24 hours following the procedure.  For the next 24 hours, DO NOT: -Drive a car -Paediatric nurse -Drink alcoholic beverages -Take any medication unless instructed by your physician -Make any legal decisions or sign important papers.  Meals: Start with liquid foods such as gelatin or soup. Progress to regular foods as tolerated. Avoid greasy, spicy, heavy foods. If nausea and/or vomiting occur, drink only clear liquids until the nausea and/or vomiting subsides. Call your physician if vomiting continues.  Special Instructions/Symptoms: Your throat may feel dry or sore from the anesthesia or the breathing tube placed in your throat during surgery. If this causes discomfort, gargle with warm salt water.  The discomfort should disappear within 24 hours.  If you had a scopolamine patch placed behind your ear for the management of post- operative nausea and/or vomiting:  1. The medication in the patch is effective for 72 hours, after which it should be removed.  Wrap patch in a tissue and discard in the trash. Wash hands thoroughly with soap and water. 2. You may remove the patch earlier than 72 hours if you experience unpleasant side effects which may include dry mouth, dizziness or visual disturbances. 3. Avoid touching the patch. Wash your hands with soap and water after contact with the patch.

## 2018-09-11 NOTE — Op Note (Signed)
NAME: Sabrina Knight [facility] MEDICAL RECORD NO: [id number] DATE OF BIRTH: Oct 19, 1950 [facility]AC[facility] LOCATION: [facility] PHYSICIAN: Wynonia Sours, MD   OPERATIVE REPORT   DATE OF PROCEDURE: 09/11/18    PREOPERATIVE DIAGNOSIS:   Carpal tunnel syndrome right hand   POSTOPERATIVE DIAGNOSIS:   Same   PROCEDURE:   Decompression median nerve right hand   SURGEON: Daryll Brod, M.D.   ASSISTANT: none   ANESTHESIA:  Bier block with sedation and Local   INTRAVENOUS FLUIDS:  Per anesthesia flow sheet.   ESTIMATED BLOOD LOSS:  Minimal.   COMPLICATIONS:  None.   SPECIMENS:  none   TOURNIQUET TIME:    Total Tourniquet Time Documented: Forearm (Right) - 22 minutes Total: Forearm (Right) - 22 minutes    DISPOSITION:  Stable to PACU.   INDICATIONS: Patient is a 68 year old female with a history of numbness and tingling of her right hand.  Nerve conductions reveal carpal tunnel syndrome.  This not responded to conservative treatment.  She has elected undergo surgical decompression of the median nerve right side.  Pre-peri-and postoperative course been discussed along with risk complications.  She is aware that there is no guarantee to the surgery the possibility of infection recurrence injury to arteries nerves tendons complete relief of symptoms and dystrophy.  She is aware that we are attempting to halt the process and allow the nerve the opportunity to get better but cannot guarantee this.  In the preoperative area the patient is seen the extremity marked by both patient and surgeon antibiotic given  OPERATIVE COURSE: She is brought to the operating room where form based IV regional anesthetic was carried out without difficulty.  She was prepped using ChloraPrep in a supine position with a right arm free.  A three-minute dry time was allowed and a timeout taken to confirm patient procedure.  A longitudinal incision was made in the right palm carried down through subcutaneous  tissue.  Bleeders were electrocauterized with bipolar.  The palmar fascia was split.  The superficial palmar arch was identified along with the flexor tendon to the ring little finger.  On the ulnar side of the median nerve the carpal retinaculum was incised with sharp dissection after a retractor was placed retracting the median nerve and flexor tendons radially and ulnar nerve ulnarly.  Right angle and stool retractor placed between skin and forearm fascia this allowed visualization of the proximal flexor retinaculum distal forearm fascia which was released for approximately 2 cm proximal to the wrist crease under direct vision after the dissecting deep structures away from the flexor retinaculum distal forearm fascia with blunt scissors.  Blunt scissors were used to do the release proximally.  The canal was explored.  The nerve was found to be hyperemic with an hourglass deformity.  Motor branch entered in the muscle distally.  The wound was copious irrigated with saline.  The skin was closed with interrupted 4 o nylon sutures.  A local infiltration quarter percent bupivacaine without epinephrine was given.  Approximately 7 to 8 cc was used.  A sterile compressive dressing with the fingers free was applied.  And deflation of the tourniquet all fingers immediately pink.  She was taken to the recovery room for observation in satisfactory condition.  Daryll Brod, MD Electronically signed, 09/11/18
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
## 2018-09-11 NOTE — Brief Op Note (Signed)
09/11/2018  2:37 PM  PATIENT:  Sabrina Knight  68 y.o. female  PRE-OPERATIVE DIAGNOSIS:  RIGHT CARPAL TUNNEL SYNDROME  POST-OPERATIVE DIAGNOSIS:  RIGHT CARPAL TUNNEL SYNDROME  PROCEDURE:  Procedure(s): CARPAL TUNNEL RELEASE (Right)  SURGEON:  Surgeon(s) and Role:    * Daryll Brod, MD - Primary  PHYSICIAN ASSISTANT:   ASSISTANTS: none   ANESTHESIA:   local, regional and IV sedation  EBL:  5 mL   BLOOD ADMINISTERED:none  DRAINS: none   LOCAL MEDICATIONS USED:  BUPIVICAINE   SPECIMEN:  No Specimen  DISPOSITION OF SPECIMEN:  N/A  COUNTS:  YES  TOURNIQUET:   Total Tourniquet Time Documented: Forearm (Right) - 22 minutes Total: Forearm (Right) - 22 minutes   DICTATION: .Dragon Dictation  PLAN OF CARE: Discharge to home after PACU  PATIENT DISPOSITION:  PACU - hemodynamically stable.

## 2018-09-14 ENCOUNTER — Encounter (HOSPITAL_BASED_OUTPATIENT_CLINIC_OR_DEPARTMENT_OTHER): Payer: Self-pay | Admitting: Orthopedic Surgery

## 2018-09-14 NOTE — Addendum Note (Signed)
Addendum  created 09/14/18 1151 by Tawni Millers, CRNA   Charge Capture section accepted

## 2018-09-25 ENCOUNTER — Other Ambulatory Visit: Payer: Self-pay

## 2018-09-25 ENCOUNTER — Other Ambulatory Visit: Payer: Self-pay | Admitting: Orthopedic Surgery

## 2018-09-25 ENCOUNTER — Encounter (HOSPITAL_BASED_OUTPATIENT_CLINIC_OR_DEPARTMENT_OTHER): Payer: Self-pay | Admitting: *Deleted

## 2018-09-25 DIAGNOSIS — G5603 Carpal tunnel syndrome, bilateral upper limbs: Secondary | ICD-10-CM | POA: Insufficient documentation

## 2018-09-29 ENCOUNTER — Other Ambulatory Visit (HOSPITAL_COMMUNITY)
Admission: RE | Admit: 2018-09-29 | Discharge: 2018-09-29 | Disposition: A | Discharge status: Home / Self Care | Payer: Medicare HMO | Source: Ambulatory Visit | Attending: Orthopedic Surgery | Admitting: Orthopedic Surgery

## 2018-09-29 DIAGNOSIS — Z01812 Encounter for preprocedural laboratory examination: Secondary | ICD-10-CM | POA: Diagnosis not present

## 2018-09-29 DIAGNOSIS — Z20828 Contact with and (suspected) exposure to other viral communicable diseases: Secondary | ICD-10-CM | POA: Insufficient documentation

## 2018-10-01 LAB — NOVEL CORONAVIRUS, NAA (HOSP ORDER, SEND-OUT TO REF LAB; TAT 18-24 HRS): SARS-CoV-2, NAA: NOT DETECTED

## 2018-10-02 ENCOUNTER — Ambulatory Visit (HOSPITAL_BASED_OUTPATIENT_CLINIC_OR_DEPARTMENT_OTHER)
Admission: RE | Admit: 2018-10-02 | Discharge: 2018-10-02 | Disposition: A | Discharge status: Home / Self Care | Payer: Medicare HMO | Source: Ambulatory Visit | Attending: Orthopedic Surgery | Admitting: Orthopedic Surgery

## 2018-10-02 ENCOUNTER — Other Ambulatory Visit: Payer: Self-pay

## 2018-10-02 ENCOUNTER — Ambulatory Visit (HOSPITAL_BASED_OUTPATIENT_CLINIC_OR_DEPARTMENT_OTHER): Payer: Medicare HMO | Admitting: Anesthesiology

## 2018-10-02 ENCOUNTER — Encounter (HOSPITAL_BASED_OUTPATIENT_CLINIC_OR_DEPARTMENT_OTHER): Payer: Self-pay | Admitting: Certified Registered"

## 2018-10-02 ENCOUNTER — Encounter (HOSPITAL_BASED_OUTPATIENT_CLINIC_OR_DEPARTMENT_OTHER)
Admission: RE | Disposition: A | Discharge status: Home / Self Care | Payer: Self-pay | Source: Ambulatory Visit | Attending: Orthopedic Surgery

## 2018-10-02 DIAGNOSIS — Z87891 Personal history of nicotine dependence: Secondary | ICD-10-CM | POA: Insufficient documentation

## 2018-10-02 DIAGNOSIS — G5602 Carpal tunnel syndrome, left upper limb: Secondary | ICD-10-CM | POA: Diagnosis not present

## 2018-10-02 DIAGNOSIS — H42 Glaucoma in diseases classified elsewhere: Secondary | ICD-10-CM | POA: Diagnosis not present

## 2018-10-02 DIAGNOSIS — J45909 Unspecified asthma, uncomplicated: Secondary | ICD-10-CM | POA: Insufficient documentation

## 2018-10-02 DIAGNOSIS — D696 Thrombocytopenia, unspecified: Secondary | ICD-10-CM | POA: Diagnosis not present

## 2018-10-02 DIAGNOSIS — E1139 Type 2 diabetes mellitus with other diabetic ophthalmic complication: Secondary | ICD-10-CM | POA: Diagnosis not present

## 2018-10-02 HISTORY — PX: CARPAL TUNNEL RELEASE: SHX101

## 2018-10-02 SURGERY — CARPAL TUNNEL RELEASE
Anesthesia: Regional | Site: Wrist | Laterality: Left

## 2018-10-02 MED ORDER — ACETAMINOPHEN 500 MG PO TABS
ORAL_TABLET | ORAL | Status: AC
  Filled 2018-10-02: qty 2

## 2018-10-02 MED ORDER — CEFAZOLIN SODIUM-DEXTROSE 2-4 GM/100ML-% IV SOLN
INTRAVENOUS | Status: AC
  Filled 2018-10-02: qty 100

## 2018-10-02 MED ORDER — LACTATED RINGERS IV SOLN
INTRAVENOUS | Status: DC
  Administered 2018-10-02: 10:00:00 via INTRAVENOUS

## 2018-10-02 MED ORDER — LIDOCAINE HCL (CARDIAC) PF 100 MG/5ML IV SOSY
PREFILLED_SYRINGE | INTRAVENOUS | Status: DC | PRN
  Administered 2018-10-02: 30 mg via INTRAVENOUS

## 2018-10-02 MED ORDER — BUPIVACAINE HCL (PF) 0.25 % IJ SOLN
INTRAMUSCULAR | Status: DC | PRN
  Administered 2018-10-02: 8 mL

## 2018-10-02 MED ORDER — 0.9 % SODIUM CHLORIDE (POUR BTL) OPTIME
TOPICAL | Status: DC | PRN
  Administered 2018-10-02: 12:00:00 200 mL

## 2018-10-02 MED ORDER — LIDOCAINE HCL (PF) 0.5 % IJ SOLN
INTRAMUSCULAR | Status: DC | PRN
  Administered 2018-10-02: 30 mL via INTRAVENOUS

## 2018-10-02 MED ORDER — FENTANYL CITRATE (PF) 100 MCG/2ML IJ SOLN: 25.0000 ug | INTRAMUSCULAR | Status: DC | PRN

## 2018-10-02 MED ORDER — SCOPOLAMINE 1 MG/3DAYS TD PT72: 1.0000 | MEDICATED_PATCH | Freq: Once | TRANSDERMAL | Status: DC

## 2018-10-02 MED ORDER — MIDAZOLAM HCL 2 MG/2ML IJ SOLN: 1.0000 mg | INTRAMUSCULAR | Status: DC | PRN

## 2018-10-02 MED ORDER — FENTANYL CITRATE (PF) 100 MCG/2ML IJ SOLN
INTRAMUSCULAR | Status: AC
  Filled 2018-10-02: qty 2

## 2018-10-02 MED ORDER — ACETAMINOPHEN 500 MG PO TABS
1000.0000 mg | ORAL_TABLET | Freq: Once | ORAL | Status: AC
  Administered 2018-10-02: 1000 mg via ORAL

## 2018-10-02 MED ORDER — FENTANYL CITRATE (PF) 100 MCG/2ML IJ SOLN
50.0000 ug | INTRAMUSCULAR | Status: DC | PRN
  Administered 2018-10-02: 100 ug via INTRAVENOUS

## 2018-10-02 MED ORDER — PROPOFOL 500 MG/50ML IV EMUL
INTRAVENOUS | Status: DC | PRN
  Administered 2018-10-02: 75 ug/kg/min via INTRAVENOUS

## 2018-10-02 MED ORDER — ONDANSETRON HCL 4 MG/2ML IJ SOLN
INTRAMUSCULAR | Status: DC | PRN
  Administered 2018-10-02: 4 mg via INTRAVENOUS

## 2018-10-02 MED ORDER — CEFAZOLIN SODIUM-DEXTROSE 2-4 GM/100ML-% IV SOLN
2.0000 g | INTRAVENOUS | Status: AC
  Administered 2018-10-02: 2 g via INTRAVENOUS

## 2018-10-02 MED ORDER — CHLORHEXIDINE GLUCONATE 4 % EX LIQD: 60.0000 mL | Freq: Once | CUTANEOUS | Status: DC

## 2018-10-02 MED ORDER — PROMETHAZINE HCL 25 MG/ML IJ SOLN: 6.2500 mg | INTRAMUSCULAR | Status: DC | PRN

## 2018-10-02 SURGICAL SUPPLY — 37 items
APL PRP STRL LF DISP 70% ISPRP (MISCELLANEOUS) ×1
BLADE SURG 15 STRL LF DISP TIS (BLADE) ×1 IMPLANT
BLADE SURG 15 STRL SS (BLADE) ×2
BNDG CMPR 9X4 STRL LF SNTH (GAUZE/BANDAGES/DRESSINGS)
BNDG COHESIVE 3X5 TAN STRL LF (GAUZE/BANDAGES/DRESSINGS) ×2 IMPLANT
BNDG ESMARK 4X9 LF (GAUZE/BANDAGES/DRESSINGS) IMPLANT
BNDG GAUZE ELAST 4 BULKY (GAUZE/BANDAGES/DRESSINGS) ×2 IMPLANT
CHLORAPREP W/TINT 26 (MISCELLANEOUS) ×2 IMPLANT
CORD BIPOLAR FORCEPS 12FT (ELECTRODE) ×2 IMPLANT
COVER BACK TABLE REUSABLE LG (DRAPES) ×2 IMPLANT
COVER MAYO STAND REUSABLE (DRAPES) ×2 IMPLANT
COVER WAND RF STERILE (DRAPES) IMPLANT
CUFF TOURN SGL QUICK 18X4 (TOURNIQUET CUFF) ×2 IMPLANT
DRAPE EXTREMITY T 121X128X90 (DISPOSABLE) ×2 IMPLANT
DRAPE SURG 17X23 STRL (DRAPES) ×2 IMPLANT
DRSG PAD ABDOMINAL 8X10 ST (GAUZE/BANDAGES/DRESSINGS) ×2 IMPLANT
GAUZE SPONGE 4X4 12PLY STRL (GAUZE/BANDAGES/DRESSINGS) ×2 IMPLANT
GAUZE XEROFORM 1X8 LF (GAUZE/BANDAGES/DRESSINGS) ×2 IMPLANT
GLOVE BIO SURGEON STRL SZ7 (GLOVE) ×2 IMPLANT
GLOVE BIOGEL PI IND STRL 8.5 (GLOVE) ×1 IMPLANT
GLOVE BIOGEL PI INDICATOR 8.5 (GLOVE) ×1
GLOVE ECLIPSE 6.5 STRL STRAW (GLOVE) ×1 IMPLANT
GLOVE SURG ORTHO 8.0 STRL STRW (GLOVE) ×2 IMPLANT
GOWN STRL REUS W/ TWL LRG LVL3 (GOWN DISPOSABLE) ×1 IMPLANT
GOWN STRL REUS W/TWL LRG LVL3 (GOWN DISPOSABLE) ×4
GOWN STRL REUS W/TWL XL LVL3 (GOWN DISPOSABLE) ×2 IMPLANT
NDL PRECISIONGLIDE 27X1.5 (NEEDLE) IMPLANT
NEEDLE PRECISIONGLIDE 27X1.5 (NEEDLE) ×2 IMPLANT
NS IRRIG 1000ML POUR BTL (IV SOLUTION) ×2 IMPLANT
PACK BASIN DAY SURGERY FS (CUSTOM PROCEDURE TRAY) ×2 IMPLANT
STOCKINETTE 4X48 STRL (DRAPES) ×2 IMPLANT
SUT ETHILON 4 0 PS 2 18 (SUTURE) ×2 IMPLANT
SUT VICRYL 4-0 PS2 18IN ABS (SUTURE) IMPLANT
SYR BULB 3OZ (MISCELLANEOUS) ×2 IMPLANT
SYR CONTROL 10ML LL (SYRINGE) ×1 IMPLANT
TOWEL GREEN STERILE FF (TOWEL DISPOSABLE) ×2 IMPLANT
UNDERPAD 30X36 HEAVY ABSORB (UNDERPADS AND DIAPERS) ×2 IMPLANT

## 2018-10-02 NOTE — Anesthesia Postprocedure Evaluation (Signed)
Anesthesia Post Note  Patient: Sabrina Knight  Procedure(s) Performed: CARPAL TUNNEL RELEASE (Left Wrist)     Patient location during evaluation: PACU Anesthesia Type: Bier Block Level of consciousness: awake and alert Pain management: pain level controlled Vital Signs Assessment: post-procedure vital signs reviewed and stable Respiratory status: spontaneous breathing and respiratory function stable Cardiovascular status: stable Postop Assessment: no apparent nausea or vomiting Anesthetic complications: no    Last Vitals:  Vitals:   10/02/18 1206 10/02/18 1215  BP: 126/64 138/75  Pulse: 65 63  Resp: 16 16  Temp:  36.6 C  SpO2: 93% 94%    Last Pain:  Vitals:   10/02/18 1215  TempSrc:   PainSc: 0-No pain                 Jaydon Avina DANIEL

## 2018-10-02 NOTE — Anesthesia Procedure Notes (Signed)
Anesthesia Regional Block: Bier block (IV Regional)   Pre-Anesthetic Checklist: ,, timeout performed, Correct Patient, Correct Site, Correct Laterality, Correct Procedure,, site marked, surgical consent,, at surgeon's request  Laterality: Left     Needles:  Injection technique: Single-shot  Needle Type: Other      Needle Gauge: 20     Additional Needles:   Procedures:,,,,, intact distal pulses, Esmarch exsanguination, single tourniquet utilized,  Narrative:   Performed by: Personally       

## 2018-10-02 NOTE — Anesthesia Preprocedure Evaluation (Addendum)
Anesthesia Evaluation  Patient identified by MRN, date of birth, ID band Patient awake    Reviewed: Allergy & Precautions, NPO status , Patient's Chart, lab work & pertinent test results  History of Anesthesia Complications Negative for: history of anesthetic complications  Airway Mallampati: II  TM Distance: >3 FB Neck ROM: Full    Dental no notable dental hx. (+) Dental Advisory Given   Pulmonary former smoker,    Pulmonary exam normal        Cardiovascular negative cardio ROS Normal cardiovascular exam     Neuro/Psych negative neurological ROS  negative psych ROS   GI/Hepatic negative GI ROS, Neg liver ROS,   Endo/Other  negative endocrine ROS  Renal/GU negative Renal ROS     Musculoskeletal Gout    Abdominal   Peds  Hematology HLD   Anesthesia Other Findings   Reproductive/Obstetrics                            Anesthesia Physical  Anesthesia Plan  ASA: II  Anesthesia Plan: Bier Block and Bier Block-LIDOCAINE ONLY   Post-op Pain Management:    Induction: Intravenous  PONV Risk Score and Plan: 2 and Ondansetron, Dexamethasone, Midazolam and Treatment may vary due to age or medical condition  Airway Management Planned: Simple Face Mask  Additional Equipment:   Intra-op Plan:   Post-operative Plan:   Informed Consent: I have reviewed the patients History and Physical, chart, labs and discussed the procedure including the risks, benefits and alternatives for the proposed anesthesia with the patient or authorized representative who has indicated his/her understanding and acceptance.     Dental advisory given  Plan Discussed with: CRNA, Anesthesiologist and Surgeon  Anesthesia Plan Comments:        Anesthesia Quick Evaluation

## 2018-10-02 NOTE — Op Note (Signed)
NAME: Sabrina Knight [facility] MEDICAL RECORD NO: [id number] DATE OF BIRTH: [birth year] [facility] LOCATION: [facility] PHYSICIAN: Wynonia Sours, MD   OPERATIVE REPORT   DATE OF PROCEDURE: 10/02/18    PREOPERATIVE DIAGNOSIS:   Carpal tunnel syndrome left hand   POSTOPERATIVE DIAGNOSIS:   Same   PROCEDURE:   Decompression median nerve left hand   SURGEON: Daryll Brod, M.D.   ASSISTANT: none   ANESTHESIA:  Bier block with sedation and Local   INTRAVENOUS FLUIDS:  Per anesthesia flow sheet.   ESTIMATED BLOOD LOSS:  Minimal.   COMPLICATIONS:  None.   SPECIMENS:  none   TOURNIQUET TIME:    Total Tourniquet Time Documented: Forearm (Left) - 22 minutes Total: Forearm (Left) - 22 minutes    DISPOSITION:  Stable to PACU.   INDICATIONS: Patient is a 68 year old female with numbness and tingling bilateral hands with positive nerve conductions.  This is indicative of bilateral carpal tunnel syndrome she is undergone release on her right side is admitted now for release to the left.  Pre-peri-and postoperative course been discussed along with risks and complications.  She is aware that there is no guarantee to the surgery the possibility of infection recurrence injury to arteries nerves tendons complete relief symptoms distally.  The preoperative the patient is seen extremity marked by both patient and surgeon antibiotic given  OPERATIVE COURSE: Patient is brought to the operating room where form based IV regional anesthetic was carried out without difficulty.  She was prepped using ChloraPrep and in supine position with a left arm free.  A three-minute dry time was allowed and timeout taken confirming patient procedure.  A longitudinal incision was made left palm carried down through subcutaneous tissue.  Bleeders were electrocauterized with bipolar.  The palmar fascia was split.  The superficial palmar arch was identified.  Flexor tendon of the ring little finger was  identified and retractors placed retracting median nerve radially and ulnar nerve ulnarly.  The flexor retinaculum was then incised on its ulnar border.  A communicating branch from the ulnar nerve was immediately apparent going into the thenar musculature.  This was protected.  The dissection carried proximally through the flexor retinaculum.  Right angle and stool retractor placed between skin and forearm fascia after dissecting the tissues free.  Deep structures were then dissected with blunt dissection.  With protection to the trans-burst nerve.  The proximal aspect of the proximal flexor retinaculum distal forearm fascia was then released for approximately 3 cm proximal to the wrist crease under direct vision.  The canal was explored.  Motor and there was a motor branch entering into muscle distally.  Moderate swelling of the tenosynovial tissue was present.  The wound was copiously irrigated with saline.  The skin was then closed interrupted 4-0 nylon sutures.  A sterile impressive dressing was applied with the fingers free after local infiltration with quarter percent view bupivacaine without epinephrine was given approximately 8 cc was used.  And deflation of the tourniquet all fingers immediately pink.  She was taken to the recovery room for observation in satisfactory condition.  She will be discharged home to return the hand center [facility] in 1 week on Tylenol ibuprofen for pain.  She has Ultram from her prior surgery.   Daryll Brod, MD Electronically signed, 10/02/18

## 2018-10-02 NOTE — H&P (Signed)
Sabrina Knight is an 68 y.o. female.   Chief Complaint: numbness left hand [id number] is a 68 yo female with bilateral CTS and post release right median nerve release. Her NCVs are  positive and she wishes to proceed with release of her left median nerve  Past Medical History:  Diagnosis Date  . Allergy    Zyrtec, Singulair; allergy testing negative; Orvil Feil.  . Asthma due to seasonal allergies   . Cataract   . Clotting disorder ([place])   . Glaucoma   . Gout   . ITP (idiopathic thrombocytopenic purpura)     Past Surgical History:  Procedure Laterality Date  . ABDOMINAL HYSTERECTOMY  01/22/1999   ovaries resected; DUB/fibroids  . CARPAL TUNNEL RELEASE Right 09/11/2018   Procedure: CARPAL TUNNEL RELEASE;  Surgeon: Daryll Brod, MD;  Location: [place];  Service: Orthopedics;  Laterality: Right;  . 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 spleen removal      Family History  Problem Relation Age of Onset  . Cancer Mother 88       lung cancer  . Hypertension Mother   . Hyperlipidemia Mother   . Mental illness Mother   . Hypertension Sister   . Hypertension Brother   . Diabetes Brother   . Cancer Brother 85       bladder cancer; non-smoker  . Heart disease Brother 86       CABG/CAD  . Hypertension Sister   . Diabetes Sister   . Hypertension Brother   . Diabetes Brother   . Hypertension Brother   . Diabetes Brother   . Hypertension Brother   . COPD Father    Social History:  reports that she quit smoking about 14 years ago. She smoked 0.50 packs per day. She has never used smokeless tobacco. She reports that she does not drink alcohol or use drugs.  Allergies: No Known Allergies  No medications prior to admission.    No results found for this or any previous visit (from the past 48 hour(s)).  No results found.   Pertinent items are noted in HPI.  Height 5\' 5"  (1.651 m), weight 85 kg.  General appearance: alert, cooperative and appears stated  age Head: Normocephalic, without obvious abnormality Neck: no JVD Resp: clear to auscultation bilaterally Cardio: regular rate and rhythm, S1, S2 normal, no murmur, click, rub or gallop GI: soft, non-tender; bowel sounds normal; no masses,  no organomegaly Extremities: numbness left hand Pulses: 2+ and symmetric Skin: Skin color, texture, turgor normal. No rashes or lesions Neurologic: Grossly normal Incision/Wound: na  Assessment/Plan CTS left Plan: CTR left. We discussed pre peri and postoperative course with the patient. She are aware that there is no guarantee to the surgery the possibility of infection recurrence and regard reserves tensioning completely symptoms distally. Questions are encouraged answer to their satisfaction. Conservative treatment has been undertaken without success. Nerve connections are positive. Patient has agreed and requested release of median nerve at the wrist. Daryll Brod 10/02/2018, 5:41 AM

## 2018-10-02 NOTE — Transfer of Care (Signed)
Immediate Anesthesia Transfer of Care Note  Patient: Sabrina Knight  Procedure(s) Performed: CARPAL TUNNEL RELEASE (Left Wrist)  Patient Location: PACU  Anesthesia Type:Bier block  Level of Consciousness: awake, alert , oriented and patient cooperative  Airway & Oxygen Therapy: Patient Spontanous Breathing  Post-op Assessment: Report given to RN and Post -op Vital signs reviewed and stable  Post vital signs: Reviewed and stable  Last Vitals:  Vitals Value Taken Time  BP    Temp    Pulse 71 10/02/18 1146  Resp 11 10/02/18 1146  SpO2 94 % 10/02/18 1146  Vitals shown include unvalidated device data.  Last Pain:  Vitals:   10/02/18 0957  TempSrc: Oral  PainSc: 0-No pain      Patients Stated Pain Goal: 3 ([id number])  Complications: No apparent anesthesia complications

## 2018-10-02 NOTE — Discharge Instructions (Addendum)

## 2018-10-02 NOTE — Brief Op Note (Signed)
10/02/2018  11:43 AM  PATIENT:  Sabrina Knight  68 y.o. female  PRE-OPERATIVE DIAGNOSIS:  LEFT CARPAL TUNNEL SYNDROME  POST-OPERATIVE DIAGNOSIS:  LEFT CARPAL TUNNEL SYNDROME  PROCEDURE:  Procedure(s) with comments: CARPAL TUNNEL RELEASE (Left) - FAB  SURGEON:  Surgeon(s) and Role:    * Daryll Brod, MD - Primary  PHYSICIAN ASSISTANT:   ASSISTANTS: none   ANESTHESIA:   local, regional and IV sedation  EBL:  26ml BLOOD ADMINISTERED:none  DRAINS: none   LOCAL MEDICATIONS USED:  BUPIVICAINE   SPECIMEN:  No Specimen  DISPOSITION OF SPECIMEN:  N/A  COUNTS:  YES  TOURNIQUET:   Total Tourniquet Time Documented: Forearm (Left) - 22 minutes Total: Forearm (Left) - 22 minutes   DICTATION: .Dragon Dictation  PLAN OF CARE: Discharge to home after PACU  PATIENT DISPOSITION:  PACU - hemodynamically stable.

## 2018-10-02 NOTE — Anesthesia Procedure Notes (Signed)
Procedure Name: MAC Date/Time: 10/02/2018 11:29 AM Performed by: Signe Colt, CRNA Pre-anesthesia Checklist: Patient identified, Emergency Drugs available, Suction available, Patient being monitored and Timeout performed Patient Re-evaluated:Patient Re-evaluated prior to induction Oxygen Delivery Method: Simple face mask

## 2018-10-05 ENCOUNTER — Encounter (HOSPITAL_BASED_OUTPATIENT_CLINIC_OR_DEPARTMENT_OTHER): Payer: Self-pay | Admitting: Orthopedic Surgery

## 2018-10-06 ENCOUNTER — Ambulatory Visit: Payer: Medicare HMO | Admitting: Family Medicine

## 2018-10-13 ENCOUNTER — Other Ambulatory Visit: Payer: Self-pay

## 2018-10-13 ENCOUNTER — Ambulatory Visit (INDEPENDENT_AMBULATORY_CARE_PROVIDER_SITE_OTHER): Payer: Medicare HMO | Admitting: Family Medicine

## 2018-10-13 DIAGNOSIS — Z789 Other specified health status: Secondary | ICD-10-CM

## 2018-10-13 DIAGNOSIS — E785 Hyperlipidemia, unspecified: Secondary | ICD-10-CM

## 2018-10-14 LAB — LIPID PANEL
Chol/HDL Ratio: 3.5 ratio (ref 0.0–4.4)
Cholesterol, Total: 167 mg/dL (ref 100–199)
HDL: 48 mg/dL (ref >39)
LDL Chol Calc (NIH): 88 mg/dL (ref 0–99)
Triglycerides: 181 mg/dL — ABNORMAL HIGH (ref 0–149)
VLDL Cholesterol Cal: 31 mg/dL (ref 5–40)

## 2018-10-14 LAB — AST: AST: 22 IU/L (ref 0–40)

## 2018-10-14 LAB — ALT: ALT: 30 IU/L (ref 0–32)

## 2018-10-22 DIAGNOSIS — Z1231 Encounter for screening mammogram for malignant neoplasm of breast: Secondary | ICD-10-CM | POA: Diagnosis not present

## 2018-10-22 LAB — HM MAMMOGRAPHY

## 2018-10-29 DIAGNOSIS — R69 Illness, unspecified: Secondary | ICD-10-CM | POA: Diagnosis not present

## 2018-11-04 DIAGNOSIS — M25511 Pain in right shoulder: Secondary | ICD-10-CM | POA: Diagnosis not present

## 2018-11-04 DIAGNOSIS — M542 Cervicalgia: Secondary | ICD-10-CM | POA: Diagnosis not present

## 2018-11-04 DIAGNOSIS — M25551 Pain in right hip: Secondary | ICD-10-CM | POA: Diagnosis not present

## 2018-11-04 DIAGNOSIS — M545 Low back pain: Secondary | ICD-10-CM | POA: Diagnosis not present

## 2018-11-18 DIAGNOSIS — S42114A Nondisplaced fracture of body of scapula, right shoulder, initial encounter for closed fracture: Secondary | ICD-10-CM | POA: Diagnosis not present

## 2018-12-01 DIAGNOSIS — J4530 Mild persistent asthma, uncomplicated: Secondary | ICD-10-CM | POA: Diagnosis not present

## 2018-12-01 DIAGNOSIS — J3089 Other allergic rhinitis: Secondary | ICD-10-CM | POA: Diagnosis not present

## 2018-12-01 DIAGNOSIS — J300 Vasomotor rhinitis: Secondary | ICD-10-CM | POA: Diagnosis not present

## 2018-12-16 DIAGNOSIS — S42114D Nondisplaced fracture of body of scapula, right shoulder, subsequent encounter for fracture with routine healing: Secondary | ICD-10-CM | POA: Diagnosis not present

## 2018-12-25 ENCOUNTER — Ambulatory Visit: Payer: Self-pay | Admitting: Family Medicine

## 2019-01-04 ENCOUNTER — Encounter: Payer: Self-pay | Admitting: Family Medicine

## 2019-01-04 ENCOUNTER — Other Ambulatory Visit: Payer: Self-pay

## 2019-01-04 ENCOUNTER — Ambulatory Visit (INDEPENDENT_AMBULATORY_CARE_PROVIDER_SITE_OTHER): Payer: Medicare HMO | Admitting: Family Medicine

## 2019-01-04 VITALS — BP 138/84 | HR 82 | Temp 97.8°F | Resp 17 | Ht 65.0 in | Wt 187.6 lb

## 2019-01-04 DIAGNOSIS — Z23 Encounter for immunization: Secondary | ICD-10-CM

## 2019-01-04 DIAGNOSIS — J310 Chronic rhinitis: Secondary | ICD-10-CM | POA: Diagnosis not present

## 2019-01-04 DIAGNOSIS — E785 Hyperlipidemia, unspecified: Secondary | ICD-10-CM | POA: Diagnosis not present

## 2019-01-04 DIAGNOSIS — M1A0710 Idiopathic chronic gout, right ankle and foot, without tophus (tophi): Secondary | ICD-10-CM | POA: Diagnosis not present

## 2019-01-04 DIAGNOSIS — R739 Hyperglycemia, unspecified: Secondary | ICD-10-CM

## 2019-01-04 DIAGNOSIS — E1165 Type 2 diabetes mellitus with hyperglycemia: Secondary | ICD-10-CM | POA: Diagnosis not present

## 2019-01-04 DIAGNOSIS — Z794 Long term (current) use of insulin: Secondary | ICD-10-CM

## 2019-01-04 LAB — POCT GLYCOSYLATED HEMOGLOBIN (HGB A1C): Hemoglobin A1C: 6.2 % — AB (ref 4.0–5.6)

## 2019-01-04 NOTE — Patient Instructions (Signed)
° ° ° °  If you have lab work done today you will be contacted with your lab results within the next 2 weeks.  If you have not heard from us then please contact us. The fastest way to get your results is to register for My Chart. ° ° °IF you received an x-ray today, you will receive an invoice from [facility]. Please contact [facility] at [phone] with questions or concerns regarding your invoice.  ° °IF you received labwork today, you will receive an invoice from [facility]. Please contact [facility] at [phone] with questions or concerns regarding your invoice.  ° °Our billing staff will not be able to assist you with questions regarding bills from these companies. ° °You will be contacted with the lab results as soon as they are available. The fastest way to get your results is to activate your My Chart account. Instructions are located on the last page of this paperwork. If you have not heard from us regarding the results in 2 weeks, please contact this office. °  ° ° ° °

## 2019-01-04 NOTE — Progress Notes (Signed)
Established Patient Office Visit  Subjective:  Patient ID: Sabrina Knight, female    DOB: [birth year]  Age: 68 y.o. MRN: [id number]  CC:  Chief Complaint  Patient presents with  . 6 month f/u cholesterol    HPI [name] presents for   Rhinitis She reports that her allergies has been stable  She needs a refill of her singulair. 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 The patient reports no acute gout attacks since last clinic visit. Attacks occur primarily in the right first MTP joint. Patient reports her chronic pain is improved, her joint stiffness is improved and her joint swelling is improved. Limitation on activities include none. The patient is avoiding high purine foods and reports consuming 0 alcoholic drinks per week.  Diabetes Mellitus: Patient presents for follow up of diabetes. Symptoms: hyperglycemia. Symptoms have been well-controlled. Patient denies hypoglycemia , nausea, paresthesia of the feet, polydipsia and polyuria.  Evaluation to date has been included: hemoglobin A1C.  Home sugars: patient does not check sugars. Treatment to date: more intensive attention to diet which has been effective.  Lab Results  Component Value Date   HGBA1C 6.5 (A) 06/23/2018     Past Medical History:  Diagnosis Date  . Allergy    Zyrtec, Singulair; allergy testing negative; Orvil Feil.  . Asthma due to seasonal allergies   . Cataract   . Clotting disorder ([place])   . Glaucoma   . Gout   . ITP (idiopathic thrombocytopenic purpura)     Past Surgical History:  Procedure Laterality Date  . ABDOMINAL HYSTERECTOMY  01/22/1999   ovaries resected; DUB/fibroids  . CARPAL TUNNEL RELEASE Right 09/11/2018   Procedure: CARPAL TUNNEL RELEASE;  Surgeon: Daryll Brod, MD;  Location: [facility];  Service: Orthopedics;   Laterality: Right;  . CARPAL TUNNEL RELEASE Left 10/02/2018   Procedure: CARPAL TUNNEL RELEASE;  Surgeon: Daryll Brod, MD;  Location: [facility];  Service: Orthopedics;  Laterality: Left;  FAB  . 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 spleen removal      Family History  Problem Relation Age of Onset  . Cancer Mother 53       lung cancer  . Hypertension Mother   . Hyperlipidemia Mother   . Mental illness Mother   . Hypertension Sister   . Hypertension Brother   . Diabetes Brother   . Cancer Brother 57       bladder cancer; non-smoker  . Heart disease Brother 52       CABG/CAD  . Hypertension Sister   . Diabetes Sister   . Hypertension Brother   . Diabetes Brother   . Hypertension Brother   . Diabetes Brother   . Hypertension Brother   . COPD Father     Social History   Socioeconomic History  . Marital status: Widowed    Spouse name: Not on file  . Number of children: 0  . Years of education: Not on file  . Highest education level: Not on file  Occupational History  . Occupation: Retired  Tobacco Use  . Smoking status: Former Smoker    Packs/day: 0.50    Quit date: 12/04/2003    Years since quitting: 15.0  . Smokeless tobacco: Never Used  Substance and Sexual Activity  . Alcohol use: No  Alcohol/week: 0.0 standard drinks  . Drug use: No  . Sexual activity: Not Currently    Birth control/protection: Surgical  Other Topics Concern  . Not on file  Social History Narrative   Marital status:  Widowed x since 2001 from [place]; married x 27 years.  Not dating really in 2019.      Children:  None; failed in vitro.      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      Tobacco: quit ; smoked 20 years.      Alcohol: none      Education: [employer].       Exercise: Yes; 3 times per week 2 hours deep water aerobics.      ADLs: independent ADLs; no assistant devices.      Advanced Directives:  None; FULL CODE; no  prolonged measures.    HCPOA: Darlene Jackson/sister-in-law.     Social Determinants of Health   Financial Resource Strain:   . Difficulty of Paying Living Expenses: Not on file  Food Insecurity:   . Worried About Charity fundraiser in the Last Year: Not on file  . Ran Out of Food in the Last Year: Not on file  Transportation Needs:   . Lack of Transportation (Medical): Not on file  . Lack of Transportation (Non-Medical): Not on file  Physical Activity:   . Days of Exercise per Week: Not on file  . Minutes of Exercise per Session: Not on file  Stress:   . Feeling of Stress : Not on file  Social Connections:   . Frequency of Communication with Friends and Family: Not on file  . Frequency of Social Gatherings with Friends and Family: Not on file  . Attends Religious Services: Not on file  . Active Member of Clubs or Organizations: Not on file  . Attends Archivist Meetings: Not on file  . Marital Status: Not on file  Intimate Partner Violence:   . Fear of Current or Ex-Partner: Not on file  . Emotionally Abused: Not on file  . Physically Abused: Not on file  . Sexually Abused: Not on file    Outpatient Medications Prior to Visit  Medication Sig Dispense Refill  . allopurinol (ZYLOPRIM) 100 MG tablet TAKE 1 TABLET BY MOUTH TWICE A DAY 180 tablet 1  . carboxymethylcellulose (REFRESH PLUS) 0.5 % SOLN Apply 1 drop to eye 3 (three) times daily as needed (for dry eyes).    . cetirizine (ZYRTEC) 10 MG tablet Take 10 mg by mouth daily.    . fexofenadine (ALLEGRA) 180 MG tablet TAKE 1 TABLET BY MOUTH EVERY DAY    . montelukast (SINGULAIR) 10 MG tablet Take 10 mg by mouth daily.    . Multiple Vitamin (MULTIVITAMIN WITH MINERALS) TABS tablet Take 1 tablet by mouth daily.    . rosuvastatin (CRESTOR) 10 MG tablet Take 1 tablet (10 mg total) by mouth every other day. 45 tablet 1  . traMADol (ULTRAM) 50 MG tablet Take 1 tablet (50 mg total) by mouth every 6 (six) hours as needed. 20  tablet 0   No facility-administered medications prior to visit.    No Known Allergies  ROS Review of Systems Review of Systems  Constitutional: Negative for activity change, appetite change, chills and fever.  HENT: Negative for congestion, nosebleeds, trouble swallowing and voice change.   Respiratory: Negative for cough, shortness of breath and wheezing.   Gastrointestinal: Negative  for diarrhea, nausea and vomiting.  Genitourinary: Negative for difficulty urinating, dysuria, flank pain and hematuria.  Musculoskeletal: Negative for back pain, joint swelling and neck pain.  Neurological: Negative for dizziness, speech difficulty, light-headedness and numbness.  See HPI. All other review of systems negative.     Objective:    Physical Exam  BP 138/84 (BP Location: Left Arm, Patient Position: Sitting, Cuff Size: Normal)   Pulse 82   Temp 97.8 F (36.6 C) (Oral)   Resp 17   Ht 5\' 5"  (1.651 m)   Wt 187 lb 9.6 oz (85.1 kg)   SpO2 93%   BMI 31.22 kg/m  Wt Readings from Last 3 Encounters:  01/04/19 187 lb 9.6 oz (85.1 kg)  10/02/18 186 lb 4.6 oz (84.5 kg)  09/11/18 187 lb 6.3 oz (85 kg)    Physical Exam  Constitutional: Oriented to person, place, and time. Appears well-developed and well-nourished.  HENT:  Head: Normocephalic and atraumatic.  Eyes: Conjunctivae and EOM are normal.  Cardiovascular: Normal rate, regular rhythm, normal heart sounds and intact distal pulses.  No murmur heard. Pulmonary/Chest: Effort normal and breath sounds normal. No stridor. No respiratory distress. Has no wheezes.  Neurological: Is alert and oriented to person, place, and time.  Skin: Skin is warm. Capillary refill takes less than 2 seconds.  Psychiatric: Has a normal mood and affect. Behavior is normal. Judgment and thought content normal.    Health Maintenance Due  Topic Date Due  . OPHTHALMOLOGY EXAM  06/25/1959  . INFLUENZA VACCINE  08/22/2018  . HEMOGLOBIN A1C  12/23/2018     There are no preventive care reminders to display for this patient.  Lab Results  Component Value Date   TSH 1.240 04/07/2017   Lab Results  Component Value Date   WBC 8.0 10/20/2017   HGB 15.0 10/20/2017   HCT 43.3 10/20/2017   MCV 91 10/20/2017   PLT 503 (H) 10/20/2017   Lab Results  Component Value Date   NA 141 06/23/2018   K 4.2 06/23/2018   CO2 20 06/23/2018   GLUCOSE 89 06/23/2018   BUN 14 06/23/2018   CREATININE 0.80 06/23/2018   BILITOT 0.3 06/23/2018   ALKPHOS 91 06/23/2018   AST 22 10/13/2018   ALT 30 10/13/2018   PROT 6.9 06/23/2018   ALBUMIN 4.4 06/23/2018   CALCIUM 9.7 06/23/2018   Lab Results  Component Value Date   CHOL 167 10/13/2018   Lab Results  Component Value Date   HDL 48 10/13/2018   Lab Results  Component Value Date   LDLCALC 88 10/13/2018   Lab Results  Component Value Date   TRIG 181 (H) 10/13/2018   Lab Results  Component Value Date   CHOLHDL 3.5 10/13/2018   Lab Results  Component Value Date   HGBA1C 6.5 (A) 06/23/2018      Assessment & Plan:   Problem List Items Addressed This Visit      Respiratory   Non-allergic rhinitis -  Stable with current meds     Other   Gout  -  Continue allopurinol and diet modification   Dyslipidemia  -  Discussed continuing Crestor every other day   Relevant Orders   Comprehensive metabolic panel    Other Visit Diagnoses    Type 2 diabetes mellitus with hyperglycemia, with long-term current use of insulin ([place])    -   Primary well controlled hemoglobin a1c is at goal Continue exercise Lipids monitored and renal function in range Reviewed  diabetic foot care Emphasized importance of eye and dental exam      Relevant Orders   HM Diabetes Foot Exam (Completed)   POCT glycosylated hemoglobin (Hb A1C)   Hyperglycemia       Need for prophylactic vaccination and inoculation against influenza          No orders of the defined types were placed in this  encounter.   Follow-up: No follow-ups on file.    Forrest Moron, MD

## 2019-01-05 ENCOUNTER — Other Ambulatory Visit: Payer: Self-pay | Admitting: Family Medicine

## 2019-01-05 DIAGNOSIS — Z961 Presence of intraocular lens: Secondary | ICD-10-CM | POA: Diagnosis not present

## 2019-01-05 DIAGNOSIS — H35033 Hypertensive retinopathy, bilateral: Secondary | ICD-10-CM | POA: Diagnosis not present

## 2019-01-05 DIAGNOSIS — H35363 Drusen (degenerative) of macula, bilateral: Secondary | ICD-10-CM | POA: Diagnosis not present

## 2019-01-05 DIAGNOSIS — H40013 Open angle with borderline findings, low risk, bilateral: Secondary | ICD-10-CM | POA: Diagnosis not present

## 2019-01-05 LAB — COMPREHENSIVE METABOLIC PANEL
ALT: 23 IU/L (ref 0–32)
AST: 19 IU/L (ref 0–40)
Albumin/Globulin Ratio: 1.5 (ref 1.2–2.2)
Albumin: 4.5 g/dL (ref 3.8–4.8)
Alkaline Phosphatase: 132 IU/L — ABNORMAL HIGH (ref 39–117)
BUN/Creatinine Ratio: 17 (ref 12–28)
BUN: 16 mg/dL (ref 8–27)
Bilirubin Total: 0.3 mg/dL (ref 0.0–1.2)
CO2: 22 mmol/L (ref 20–29)
Calcium: 10.5 mg/dL — ABNORMAL HIGH (ref 8.7–10.3)
Chloride: 104 mmol/L (ref 96–106)
Creatinine, Ser: 0.92 mg/dL (ref 0.57–1.00)
GFR calc Af Amer: 73 mL/min/{1.73_m2} (ref >59)
GFR calc non Af Amer: 64 mL/min/{1.73_m2} (ref >59)
Globulin, Total: 3 g/dL (ref 1.5–4.5)
Glucose: 100 mg/dL — ABNORMAL HIGH (ref 65–99)
Potassium: 4.6 mmol/L (ref 3.5–5.2)
Sodium: 142 mmol/L (ref 134–144)
Total Protein: 7.5 g/dL (ref 6.0–8.5)

## 2019-01-08 ENCOUNTER — Encounter: Payer: Self-pay | Admitting: *Deleted

## 2019-01-08 ENCOUNTER — Other Ambulatory Visit: Payer: Self-pay | Admitting: Family Medicine

## 2019-01-08 DIAGNOSIS — J3089 Other allergic rhinitis: Secondary | ICD-10-CM

## 2019-01-08 DIAGNOSIS — J300 Vasomotor rhinitis: Secondary | ICD-10-CM | POA: Insufficient documentation

## 2019-01-08 DIAGNOSIS — J4530 Mild persistent asthma, uncomplicated: Secondary | ICD-10-CM

## 2019-01-08 NOTE — Telephone Encounter (Signed)
Forwarding medication refill request to the clinical pool for review. 

## 2019-02-05 ENCOUNTER — Encounter: Payer: Self-pay | Admitting: *Deleted

## 2019-05-17 ENCOUNTER — Encounter: Payer: Self-pay | Admitting: Family Medicine

## 2019-05-17 ENCOUNTER — Other Ambulatory Visit: Payer: Self-pay

## 2019-05-17 ENCOUNTER — Telehealth (INDEPENDENT_AMBULATORY_CARE_PROVIDER_SITE_OTHER): Payer: Medicare HMO | Admitting: Family Medicine

## 2019-05-17 VITALS — BP 112/62

## 2019-05-17 DIAGNOSIS — E1165 Type 2 diabetes mellitus with hyperglycemia: Secondary | ICD-10-CM | POA: Diagnosis not present

## 2019-05-17 DIAGNOSIS — Z794 Long term (current) use of insulin: Secondary | ICD-10-CM

## 2019-05-17 DIAGNOSIS — D75839 Thrombocytosis, unspecified: Secondary | ICD-10-CM

## 2019-05-17 DIAGNOSIS — D473 Essential (hemorrhagic) thrombocythemia: Secondary | ICD-10-CM

## 2019-05-17 DIAGNOSIS — E785 Hyperlipidemia, unspecified: Secondary | ICD-10-CM | POA: Diagnosis not present

## 2019-05-17 NOTE — Progress Notes (Addendum)
Virtual Visit Note  I connected with patient on 05/17/19 at 520pm by video thru [employer] and verified that I am speaking with the correct person using two identifiers. Sabrina Knight is currently located at home and patient is currently with them during visit. The provider, Rutherford Guys, MD is located in their office at time of visit.  I discussed the limitations, risks, security and privacy concerns of performing an evaluation and management service by telephone and the availability of in person appointments. I also discussed with the patient that there may be a patient responsible charge related to this service. The patient expressed understanding and agreed to proceed.   I provided 12 minutes of non-face-to-face time during this encounter.  CC: transfer of care  HPI ? PMH: DM2, HLP, gout, ITP  Last OV dec 2020  She reports that she is doing really well Has no acute concerns today Exercises 2 hours in deep end of pool 3 x week  Has not had any gout flare ups since started on allopurinol DM2 per a1c 6.6 in 2019, diet controlled Tolerating crestor qod very well Has appt with me June 2021, will do labs prior to visit  Lab Results  Component Value Date   HGBA1C 6.2 (A) 01/04/2019   HGBA1C 6.5 (A) 06/23/2018   HGBA1C 6.4 (A) 10/20/2017   Lab Results  Component Value Date   LDLCALC 88 10/13/2018   CREATININE 0.92 01/04/2019   Lab Results  Component Value Date   LABURIC 7.0 04/07/2017   Lab Results  Component Value Date   WBC 8.0 10/20/2017   HGB 15.0 10/20/2017   HCT 43.3 10/20/2017   MCV 91 10/20/2017   PLT 503 (H) 10/20/2017    No Known Allergies  Prior to Admission medications   Medication Sig Start Date End Date Taking? Authorizing Provider  allopurinol (ZYLOPRIM) 100 MG tablet TAKE 1 TABLET BY MOUTH TWICE A DAY 01/05/19  Yes Stallings, Zoe A, MD  cetirizine (ZYRTEC) 10 MG tablet Take 10 mg by mouth daily.   Yes [provider]  fexofenadine  (ALLEGRA) 180 MG tablet TAKE 1 TABLET BY MOUTH EVERY DAY 10/28/16  Yes [provider]  montelukast (SINGULAIR) 10 MG tablet Take 10 mg by mouth daily. 01/07/13  Yes [provider]  rosuvastatin (CRESTOR) 10 MG tablet TAKE 1 TABLET BY MOUTH EVERY OTHER DAY 01/08/19  Yes Forrest Moron, MD  Multiple Vitamin (MULTIVITAMIN WITH MINERALS) TABS tablet Take 1 tablet by mouth daily.    [provider]    Past Medical History:  Diagnosis Date  . Allergy    Zyrtec, Singulair; allergy testing negative; Orvil Feil.  . Asthma due to seasonal allergies   . Cataract   . Clotting disorder ([place])   . Glaucoma   . Gout   . ITP (idiopathic thrombocytopenic purpura)     Past Surgical History:  Procedure Laterality Date  . ABDOMINAL HYSTERECTOMY  01/22/1999   ovaries resected; DUB/fibroids  . CARPAL TUNNEL RELEASE Right 09/11/2018   Procedure: CARPAL TUNNEL RELEASE;  Surgeon: Daryll Brod, MD;  Location: [facility];  Service: Orthopedics;  Laterality: Right;  . CARPAL TUNNEL RELEASE Left 10/02/2018   Procedure: CARPAL TUNNEL RELEASE;  Surgeon: Daryll Brod, MD;  Location: [facility];  Service: Orthopedics;  Laterality: Left;  FAB  . 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 spleen removal      Social History   Tobacco Use  . Smoking  status: Former Smoker    Packs/day: 0.50    Quit date: 12/04/2003    Years since quitting: 15.4  . Smokeless tobacco: Never Used  Substance Use Topics  . Alcohol use: No    Alcohol/week: 0.0 standard drinks    Family History  Problem Relation Age of Onset  . Cancer Mother 85       lung cancer  . Hypertension Mother   . Hyperlipidemia Mother   . Mental illness Mother   . Hypertension Sister   . Hypertension Brother   . Diabetes Brother   . Cancer Brother 97       bladder cancer; non-smoker  . Heart disease Brother 35       CABG/CAD  . Hypertension Sister   . Diabetes Sister   . Hypertension Brother   .  Diabetes Brother   . Hypertension Brother   . Diabetes Brother   . Hypertension Brother   . COPD Father     ROS Per hpi  Objective  Vitals as reported by the patient: BP 112/62  GEN: AAOx3, NAD HEENT: [place]/AT, pupils are symmetrical, EOMI, non-icteric sclera Resp: breathing comfortably, speaking in full sentences Skin: no rashes noted, no pallor Psych: good eye contact, normal mood and affect  ASSESSMENT and PLAN  1. Type 2 diabetes mellitus with hyperglycemia, with long-term current use of insulin ([facility]) - Hemoglobin A1c; Future - Microalbumin / creatinine urine ratio; Future - TSH; Future  2. Dyslipidemia - Lipid panel; Future - Comprehensive metabolic panel; Future  3. Thrombocytosis ([facility]) - CBC with Differential/Platelet; Future  PMH, PSH, meds, allergies, Fhx, Shx, reviewed with patient today.  FOLLOW-UP: as scheduled with fasting labs prior to appt   The above assessment and management plan was discussed with the patient. The patient verbalized understanding of and has agreed to the management plan. Patient is aware to call the clinic if symptoms persist or worsen. Patient is aware when to return to the clinic for a follow-up visit. Patient educated on when it is appropriate to go to the emergency department.     Rutherford Guys, MD [facility] [place], [place] [address] Ph.  [phone] Fax [phone]

## 2019-05-17 NOTE — Patient Instructions (Signed)
° ° ° °  If you have lab work done today you will be contacted with your lab results within the next 2 weeks.  If you have not heard from us then please contact us. The fastest way to get your results is to register for My Chart. ° ° °IF you received an x-ray today, you will receive an invoice from [facility]. Please contact [facility] at [phone] with questions or concerns regarding your invoice.  ° °IF you received labwork today, you will receive an invoice from [facility]. Please contact [facility] at [phone] with questions or concerns regarding your invoice.  ° °Our billing staff will not be able to assist you with questions regarding bills from these companies. ° °You will be contacted with the lab results as soon as they are available. The fastest way to get your results is to activate your My Chart account. Instructions are located on the last page of this paperwork. If you have not heard from us regarding the results in 2 weeks, please contact this office. °  ° ° ° °

## 2019-05-18 ENCOUNTER — Telehealth: Payer: Self-pay | Admitting: Family Medicine

## 2019-05-18 NOTE — Telephone Encounter (Signed)
Pt have have lab on 06/24/19 For phy

## 2019-06-24 ENCOUNTER — Ambulatory Visit (INDEPENDENT_AMBULATORY_CARE_PROVIDER_SITE_OTHER): Payer: Medicare HMO | Admitting: Family Medicine

## 2019-06-24 ENCOUNTER — Other Ambulatory Visit: Payer: Self-pay

## 2019-06-24 DIAGNOSIS — E1165 Type 2 diabetes mellitus with hyperglycemia: Secondary | ICD-10-CM | POA: Diagnosis not present

## 2019-06-24 DIAGNOSIS — Z794 Long term (current) use of insulin: Secondary | ICD-10-CM | POA: Diagnosis not present

## 2019-06-24 DIAGNOSIS — D75839 Thrombocytosis, unspecified: Secondary | ICD-10-CM

## 2019-06-24 DIAGNOSIS — D473 Essential (hemorrhagic) thrombocythemia: Secondary | ICD-10-CM | POA: Diagnosis not present

## 2019-06-24 DIAGNOSIS — E785 Hyperlipidemia, unspecified: Secondary | ICD-10-CM

## 2019-06-25 ENCOUNTER — Encounter: Payer: Medicare HMO | Admitting: Family Medicine

## 2019-06-25 LAB — CBC WITH DIFFERENTIAL/PLATELET
Basophils Absolute: 0.1 10*3/uL (ref 0.0–0.2)
Basos: 1 %
EOS (ABSOLUTE): 0.3 10*3/uL (ref 0.0–0.4)
Eos: 3 %
Hematocrit: 45.8 % (ref 34.0–46.6)
Hemoglobin: 14.8 g/dL (ref 11.1–15.9)
Immature Grans (Abs): 0 10*3/uL (ref 0.0–0.1)
Immature Granulocytes: 0 %
Lymphocytes Absolute: 4.1 10*3/uL — ABNORMAL HIGH (ref 0.7–3.1)
Lymphs: 45 %
MCH: 30.5 pg (ref 26.6–33.0)
MCHC: 32.3 g/dL (ref 31.5–35.7)
MCV: 94 fL (ref 79–97)
Monocytes Absolute: 1 10*3/uL — ABNORMAL HIGH (ref 0.1–0.9)
Monocytes: 11 %
Neutrophils Absolute: 3.6 10*3/uL (ref 1.4–7.0)
Neutrophils: 40 %
Platelets: 485 10*3/uL — ABNORMAL HIGH (ref 150–450)
RBC: 4.85 x10E6/uL (ref 3.77–5.28)
RDW: 13.2 % (ref 11.7–15.4)
WBC: 9.1 10*3/uL (ref 3.4–10.8)

## 2019-06-25 LAB — COMPREHENSIVE METABOLIC PANEL
ALT: 30 IU/L (ref 0–32)
AST: 27 IU/L (ref 0–40)
Albumin/Globulin Ratio: 1.7 (ref 1.2–2.2)
Albumin: 4.4 g/dL (ref 3.8–4.8)
Alkaline Phosphatase: 108 IU/L (ref 48–121)
BUN/Creatinine Ratio: 15 (ref 12–28)
BUN: 13 mg/dL (ref 8–27)
Bilirubin Total: 0.3 mg/dL (ref 0.0–1.2)
CO2: 22 mmol/L (ref 20–29)
Calcium: 10.2 mg/dL (ref 8.7–10.3)
Chloride: 105 mmol/L (ref 96–106)
Creatinine, Ser: 0.86 mg/dL (ref 0.57–1.00)
GFR calc Af Amer: 80 mL/min/{1.73_m2} (ref >59)
GFR calc non Af Amer: 69 mL/min/{1.73_m2} (ref >59)
Globulin, Total: 2.6 g/dL (ref 1.5–4.5)
Glucose: 115 mg/dL — ABNORMAL HIGH (ref 65–99)
Potassium: 4.6 mmol/L (ref 3.5–5.2)
Sodium: 144 mmol/L (ref 134–144)
Total Protein: 7 g/dL (ref 6.0–8.5)

## 2019-06-25 LAB — TSH: TSH: 1.13 u[IU]/mL (ref 0.450–4.500)

## 2019-06-25 LAB — LIPID PANEL
Chol/HDL Ratio: 3.6 ratio (ref 0.0–4.4)
Cholesterol, Total: 178 mg/dL (ref 100–199)
HDL: 49 mg/dL (ref >39)
LDL Chol Calc (NIH): 98 mg/dL (ref 0–99)
Triglycerides: 180 mg/dL — ABNORMAL HIGH (ref 0–149)
VLDL Cholesterol Cal: 31 mg/dL (ref 5–40)

## 2019-06-25 LAB — MICROALBUMIN / CREATININE URINE RATIO
Creatinine, Urine: 46.7 mg/dL
Microalb/Creat Ratio: 30 mg/g creat — ABNORMAL HIGH (ref 0–29)
Microalbumin, Urine: 13.9 ug/mL

## 2019-06-25 LAB — HEMOGLOBIN A1C
Est. average glucose Bld gHb Est-mCnc: 143 mg/dL
Hgb A1c MFr Bld: 6.6 % — ABNORMAL HIGH (ref 4.8–5.6)

## 2019-06-28 ENCOUNTER — Encounter: Payer: Self-pay | Admitting: Radiology

## 2019-06-29 ENCOUNTER — Encounter: Payer: Self-pay | Admitting: Family Medicine

## 2019-06-29 ENCOUNTER — Other Ambulatory Visit: Payer: Self-pay

## 2019-06-29 ENCOUNTER — Ambulatory Visit (INDEPENDENT_AMBULATORY_CARE_PROVIDER_SITE_OTHER): Payer: Medicare HMO | Admitting: Family Medicine

## 2019-06-29 VITALS — BP 127/68 | HR 78 | Temp 98.5°F | Ht 65.0 in | Wt 194.0 lb

## 2019-06-29 DIAGNOSIS — E1129 Type 2 diabetes mellitus with other diabetic kidney complication: Secondary | ICD-10-CM | POA: Diagnosis not present

## 2019-06-29 DIAGNOSIS — E785 Hyperlipidemia, unspecified: Secondary | ICD-10-CM | POA: Diagnosis not present

## 2019-06-29 DIAGNOSIS — H9193 Unspecified hearing loss, bilateral: Secondary | ICD-10-CM

## 2019-06-29 DIAGNOSIS — M85852 Other specified disorders of bone density and structure, left thigh: Secondary | ICD-10-CM | POA: Diagnosis not present

## 2019-06-29 DIAGNOSIS — Z0001 Encounter for general adult medical examination with abnormal findings: Secondary | ICD-10-CM | POA: Diagnosis not present

## 2019-06-29 DIAGNOSIS — R809 Proteinuria, unspecified: Secondary | ICD-10-CM | POA: Diagnosis not present

## 2019-06-29 DIAGNOSIS — M85851 Other specified disorders of bone density and structure, right thigh: Secondary | ICD-10-CM

## 2019-06-29 DIAGNOSIS — Z9081 Acquired absence of spleen: Secondary | ICD-10-CM

## 2019-06-29 DIAGNOSIS — Z0000 Encounter for general adult medical examination without abnormal findings: Secondary | ICD-10-CM

## 2019-06-29 MED ORDER — ALLOPURINOL 100 MG PO TABS: 100.0000 mg | ORAL_TABLET | Freq: Two times a day (BID) | ORAL | 1 refills | Status: DC

## 2019-06-29 MED ORDER — ROSUVASTATIN CALCIUM 10 MG PO TABS: 10.0000 mg | ORAL_TABLET | ORAL | 1 refills | Status: DC

## 2019-06-29 MED ORDER — LISINOPRIL 5 MG PO TABS: 2.5000 mg | ORAL_TABLET | Freq: Every day | ORAL | 1 refills | Status: DC

## 2019-06-29 NOTE — Progress Notes (Signed)
Presents today for [employer] Visit-Subsequent.   Date of last exam: 06/23/2018  Interpreter used for this visit? n/a  Patient Care Team: Forrest Moron, MD as PCP - General (Internal Medicine)   Other items to address today: none   Cancer Screening: Cervical: n/a due to age Breast: mammo oct 2020 Colon: colonoscopy feb 2019    Other Screening: Last screening for diabetes: h/o DM2 diet controlled Last lipid screening: h/o HLP on crestor  Lab Results  Component Value Date   HGBA1C 6.6 (H) 06/24/2019   HGBA1C 6.2 (A) 01/04/2019   HGBA1C 6.5 (A) 06/23/2018   Lab Results  Component Value Date   LDLCALC 98 06/24/2019   CREATININE 0.86 06/24/2019    ADVANCE DIRECTIVES: Discussed: yes, has living well in place On File: no, patient will bring in her living will   Immunization status:  Immunization History  Administered Date(s) Administered  . Influenza, High Dose Seasonal PF 10/15/2017, 10/29/2018, 10/29/2018  . Influenza,inj,Quad PF,6+ Mos 10/08/2016  . Pneumococcal Conjugate-13 10/08/2016  . Pneumococcal Polysaccharide-23 10/20/2017  . Td 10/08/2016  . Zoster 06/29/2010     Health Maintenance Due  Topic Date Due  . COVID-19 Vaccine (1) Never done   Patent reports she completed covid vaccines, she will bring in documentation  Functional Status Survey: Is the patient deaf or have difficulty hearing?: Yes(have a tendency to play tv too loud, may have a little difficulty) Does the patient have difficulty seeing, even when wearing glasses/contacts?: No Does the patient have difficulty concentrating, remembering, or making decisions?: No Does the patient have difficulty walking or climbing stairs?: Yes(have bursitis in both hips / arthritis in shoulders/ [employer] water exercise) Does the patient have difficulty dressing or bathing?: No Does the patient have difficulty doing errands alone such as visiting a doctor's office or shopping?: No     6CIT Screen 06/29/2019  What Year? 0 points  What month? 0 points  What time? 0 points  Count back from 20 0 points  Months in reverse 0 points  Repeat phrase 0 points  Total Score 0     Home Environment: safe  Urinary Incontinence Screening: no concerns  Patient Active Problem List   Diagnosis Date Noted  . Mild persistent asthma, uncomplicated 31/51/7616  . Vasomotor rhinitis 01/08/2019  . Other allergic rhinitis 01/08/2019  . Bilateral carpal tunnel syndrome 09/25/2018  . Dyslipidemia 10/22/2017  . Statin intolerance 10/22/2017  . Newly diagnosed diabetes ([place]) 10/22/2017  . Gout 12/23/2016  . Class 1 obesity due to excess calories with serious comorbidity and body mass index (BMI) of 31.0 to 31.9 in adult 05/19/2016  . Osteopenia of necks of both femurs 05/19/2016  . Thrombocytosis ([place]) 02/08/2014  . High cholesterol 02/01/2013  . H/O splenectomy 02/01/2013  . Non-allergic rhinitis 02/01/2013  . Glaucoma 02/01/2013     Past Medical History:  Diagnosis Date  . Allergy    Zyrtec, Singulair; allergy testing negative; Orvil Feil.  . Arthritis   . Asthma due to seasonal allergies   . Cataract   . Clotting disorder ([place])   . Glaucoma   . Gout   . ITP (idiopathic thrombocytopenic purpura)      Past Surgical History:  Procedure Laterality Date  . ABDOMINAL HYSTERECTOMY  01/22/1999   ovaries resected; DUB/fibroids  . CARPAL TUNNEL RELEASE Right 09/11/2018   Procedure: CARPAL TUNNEL RELEASE;  Surgeon: Daryll Brod, MD;  Location: [facility];  Service: Orthopedics;  Laterality: Right;  .  CARPAL TUNNEL RELEASE Left 10/02/2018   Procedure: CARPAL TUNNEL RELEASE;  Surgeon: Daryll Brod, MD;  Location: [facility];  Service: Orthopedics;  Laterality: Left;  FAB  . EYE SURGERY N/A    Phreesia 06/26/2019  . 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 spleen removal       Family History  Problem Relation Age of Onset  . Cancer Mother 27       lung  cancer  . Hypertension Mother   . Hyperlipidemia Mother   . Mental illness Mother   . Hypertension Sister   . Hypertension Brother   . Diabetes Brother   . Cancer Brother 45       bladder cancer; non-smoker  . Heart disease Brother 62       CABG/CAD  . Hypertension Sister   . Diabetes Sister   . Hypertension Brother   . Diabetes Brother   . Hypertension Brother   . Diabetes Brother   . Hypertension Brother   . COPD Father      Social History   Socioeconomic History  . Marital status: Widowed    Spouse name: Not on file  . Number of children: 0  . Years of education: Not on file  . Highest education level: Not on file  Occupational History  . Occupation: Retired  Tobacco Use  . Smoking status: Former Smoker    Packs/day: 0.50    Quit date: 12/04/2003    Years since quitting: 15.5  . Smokeless tobacco: Never Used  Substance and Sexual Activity  . Alcohol use: No    Alcohol/week: 0.0 standard drinks  . Drug use: No  . Sexual activity: Not Currently    Birth control/protection: Surgical  Other Topics Concern  . Not on file  Social History Narrative   Marital status:  Widowed x since 2001 from [place]; married x 27 years.  Not dating really in 2019.      Children:  None; failed in vitro.      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      Tobacco: quit ; smoked 20 years.      Alcohol: none      Education: [employer].       Exercise: Yes; 3 times per week 2 hours deep water aerobics.      ADLs: independent ADLs; no assistant devices.      Advanced Directives:  None; FULL CODE; no prolonged measures.    HCPOA: Darlene Jackson/sister-in-law.     Social Determinants of Health   Financial Resource Strain:   . Difficulty of Paying Living Expenses:   Food Insecurity:   . Worried About Charity fundraiser in the Last Year:   . Arboriculturist in the Last Year:   Transportation Needs:   . Film/video editor (Medical):     Marland Kitchen Lack of Transportation (Non-Medical):   Physical Activity:   . Days of Exercise per Week:   . Minutes of Exercise per Session:   Stress:   . Feeling of Stress :   Social Connections:   . Frequency of Communication with Friends and Family:   . Frequency of Social Gatherings with Friends and Family:   . Attends Religious Services:   . Active Member of Clubs or Organizations:   . Attends Archivist Meetings:   Marland Kitchen Marital Status:   Intimate Partner Violence:   . Fear  of Current or Ex-Partner:   . Emotionally Abused:   Marland Kitchen Physically Abused:   . Sexually Abused:      No Known Allergies   Prior to Admission medications   Medication Sig Start Date End Date Taking? Authorizing Provider  allopurinol (ZYLOPRIM) 100 MG tablet TAKE 1 TABLET BY MOUTH TWICE A DAY 01/05/19  Yes Stallings, Zoe A, MD  cetirizine (ZYRTEC) 10 MG tablet Take 10 mg by mouth daily.   Yes [provider]  fexofenadine (ALLEGRA) 180 MG tablet TAKE 1 TABLET BY MOUTH EVERY DAY 10/28/16  Yes [provider]  Javier Docker Oil 300 MG CAPS Take by mouth.   Yes [provider]  montelukast (SINGULAIR) 10 MG tablet Take 10 mg by mouth daily. 01/07/13  Yes [provider]  Multiple Vitamin (MULTIVITAMIN WITH MINERALS) TABS tablet Take 1 tablet by mouth daily.   Yes [provider]  rosuvastatin (CRESTOR) 10 MG tablet TAKE 1 TABLET BY MOUTH EVERY OTHER DAY 01/08/19  Yes Stallings, Zoe A, MD  VITAMIN D, CHOLECALCIFEROL, PO Take by mouth.   Yes [provider]  vitamin E (VITAMIN E) 180 MG (400 UNITS) capsule Take 400 Units by mouth daily.   Yes [provider]  co-enzyme Q-10 30 MG capsule Take by mouth.    [provider]  cyanocobalamin 100 MCG tablet Take by mouth.    [provider]     Depression screen [facility] 2/9 06/29/2019 05/17/2019 01/04/2019 06/23/2018 10/20/2017  Decreased Interest 0 0 0 0 0  Down, Depressed, Hopeless 0 0 0 0 0  PHQ - 2  Score 0 0 0 0 0     Fall Risk  06/29/2019 05/17/2019 01/04/2019 06/23/2018 10/20/2017  Falls in the past year? 0 0 1 0 No  Number falls in past yr: 0 - 0 0 -  Injury with Fall? 0 - 0 0 -  Follow up Falls evaluation completed Falls evaluation completed Falls evaluation completed Falls evaluation completed -      PHYSICAL EXAM: BP 127/68   Pulse 78   Temp 98.5 F (36.9 C)   Ht 5\' 5"  (1.651 m)   Wt 194 lb (88 kg)   SpO2 92%   BMI 32.28 kg/m    Wt Readings from Last 3 Encounters:  06/29/19 194 lb (88 kg)  01/04/19 187 lb 9.6 oz (85.1 kg)  10/02/18 186 lb 4.6 oz (84.5 kg)      Hearing Screening   125Hz  250Hz  500Hz  1000Hz  2000Hz  3000Hz  4000Hz  6000Hz  8000Hz   Right ear:           Left ear:             Visual Acuity Screening   Right eye Left eye Both eyes  Without correction: 2030 2030 2030  With correction:         Physical Exam    ASSESSMENT/PLAN:  1. Encounter for Medicare annual wellness exam Education/Counseling provided regarding diet and exercise, prevention of chronic diseases, smoking/tobacco cessation, if applicable, and reviewed "Covered Medicare Preventive Services."  2. Osteopenia of necks of both femurs - DG Bone Density; Future  3. Bilateral hearing loss, unspecified hearing loss type - Ambulatory referral to Audiology  4. H/O splenectomy Thrombocytosis slowly resolving  5. Dyslipidemia Controlled. Continue current regime.   6. Type 2 diabetes mellitus with microalbuminuria, without long-term current use of insulin (HCC) Diet controlled. + urine micro. Started low dose lisinopril, reviewed r/se/b.  Other orders - lisinopril (ZESTRIL) 5 MG tablet; Take  0.5 tablets (2.5 mg total) by mouth daily. - allopurinol (ZYLOPRIM) 100 MG tablet; Take 1 tablet (100 mg total) by mouth 2 (two) times daily. - rosuvastatin (CRESTOR) 10 MG tablet; Take 1 tablet (10 mg total) by mouth every other day.   Return in about 6 months (around 12/29/2019).

## 2019-06-29 NOTE — Patient Instructions (Addendum)
   If you have lab work done today you will be contacted with your lab results within the next 2 weeks.  If you have not heard from us then please contact us. The fastest way to get your results is to register for My Chart.   IF you received an x-ray today, you will receive an invoice from [facility]. Please contact [facility] at [phone] with questions or concerns regarding your invoice.   IF you received labwork today, you will receive an invoice from [facility]. Please contact [facility] at [phone] with questions or concerns regarding your invoice.   Our billing staff will not be able to assist you with questions regarding bills from these companies.  You will be contacted with the lab results as soon as they are available. The fastest way to get your results is to activate your My Chart account. Instructions are located on the last page of this paperwork. If you have not heard from us regarding the results in 2 weeks, please contact this office.      Preventive Care 65 Years and Older, Female Preventive care refers to lifestyle choices and visits with your health care provider that can promote health and wellness. This includes:  A yearly physical exam. This is also called an annual well check.  Regular dental and eye exams.  Immunizations.  Screening for certain conditions.  Healthy lifestyle choices, such as diet and exercise. What can I expect for my preventive care visit? Physical exam Your health care provider will check:  Height and weight. These may be used to calculate body mass index (BMI), which is a measurement that tells if you are at a healthy weight.  Heart rate and blood pressure.  Your skin for abnormal spots. Counseling Your health care provider may ask you questions about:  Alcohol, tobacco, and drug use.  Emotional well-being.  Home and relationship well-being.  Sexual activity.  Eating habits.  History of  falls.  Memory and ability to understand (cognition).  Work and work environment.  Pregnancy and menstrual history. What immunizations do I need?  Influenza (flu) vaccine  This is recommended every year. Tetanus, diphtheria, and pertussis (Tdap) vaccine  You may need a Td booster every 10 years. Varicella (chickenpox) vaccine  You may need this vaccine if you have not already been vaccinated. Zoster (shingles) vaccine  You may need this after age 60. Pneumococcal conjugate (PCV13) vaccine  One dose is recommended after age 70. Pneumococcal polysaccharide (PPSV23) vaccine  One dose is recommended after age 70. Measles, mumps, and rubella (MMR) vaccine  You may need at least one dose of MMR if you were born in 1957 or later. You may also need a second dose. Meningococcal conjugate (MenACWY) vaccine  You may need this if you have certain conditions. Hepatitis A vaccine  You may need this if you have certain conditions or if you travel or work in places where you may be exposed to hepatitis A. Hepatitis B vaccine  You may need this if you have certain conditions or if you travel or work in places where you may be exposed to hepatitis B. Haemophilus influenzae type b (Hib) vaccine  You may need this if you have certain conditions. You may receive vaccines as individual doses or as more than one vaccine together in one shot (combination vaccines). Talk with your health care provider about the risks and benefits of combination vaccines. What tests do I need? Blood tests  Lipid and cholesterol levels.   These may be checked every 5 years, or more frequently depending on your overall health.  Hepatitis C test.  Hepatitis B test. Screening  Lung cancer screening. You may have this screening every year starting at age 17 if you have a 30-pack-year history of smoking and currently smoke or have quit within the past 15 years.  Colorectal cancer screening. All adults should  have this screening starting at age 57 and continuing until age 92. Your health care provider may recommend screening at age 41 if you are at increased risk. You will have tests every 1-10 years, depending on your results and the type of screening test.  Diabetes screening. This is done by checking your blood sugar (glucose) after you have not eaten for a while (fasting). You may have this done every 1-3 years.  Mammogram. This may be done every 1-2 years. Talk with your health care provider about how often you should have regular mammograms.  BRCA-related cancer screening. This may be done if you have a family history of breast, ovarian, tubal, or peritoneal cancers. Other tests  Sexually transmitted disease (STD) testing.  Bone density scan. This is done to screen for osteoporosis. You may have this done starting at age 22. Follow these instructions at home: Eating and drinking  Eat a diet that includes fresh fruits and vegetables, whole grains, lean protein, and low-fat dairy products. Limit your intake of foods with high amounts of sugar, saturated fats, and salt.  Take vitamin and mineral supplements as recommended by your health care provider.  Do not drink alcohol if your health care provider tells you not to drink.  If you drink alcohol: ? Limit how much you have to 0-1 drink a day. ? Be aware of how much alcohol is in your drink. In the [place] one drink equals one 12 oz bottle of beer (355 mL), one 5 oz glass of wine (148 mL), or one 1 oz glass of hard liquor (44 mL). Lifestyle  Take daily care of your teeth and gums.  Stay active. Exercise for at least 30 minutes on 5 or more days each week.  Do not use any products that contain nicotine or tobacco, such as cigarettes, e-cigarettes, and chewing tobacco. If you need help quitting, ask your health care provider.  If you are sexually active, practice safe sex. Use a condom or other form of protection in order to prevent STIs  (sexually transmitted infections).  Talk with your health care provider about taking a low-dose aspirin or statin. What's next?  Go to your health care provider once a year for a well check visit.  Ask your health care provider how often you should have your eyes and teeth checked.  Stay up to date on all vaccines. This information is not intended to replace advice given to you by your health care provider. Make sure you discuss any questions you have with your health care provider. Document Revised: 01/01/2018 Document Reviewed: 01/01/2018 Elsevier Patient Education  2020 [employer].

## 2019-07-13 ENCOUNTER — Other Ambulatory Visit: Payer: Self-pay

## 2019-07-13 ENCOUNTER — Ambulatory Visit: Payer: Medicare HMO | Attending: Family Medicine | Admitting: Audiologist

## 2019-07-13 DIAGNOSIS — H9193 Unspecified hearing loss, bilateral: Secondary | ICD-10-CM | POA: Insufficient documentation

## 2019-07-13 DIAGNOSIS — H903 Sensorineural hearing loss, bilateral: Secondary | ICD-10-CM

## 2019-07-13 NOTE — Procedures (Signed)
  Outpatient Audiology and [facility] [place], [place]  [address] [phone]  AUDIOLOGICAL  EVALUATION  NAME: Sabrina Knight     DOB:   July 09, 1950      MRN: [id number]                                                                                     DATE: 07/13/2019     REFERENT: Rutherford Guys, MD STATUS: Outpatient DIAGNOSIS: Sensorineural Hearing Loss both Ears    History: [name] is receiving a hearing evaluation due to concerns for hearing loss. [name] has difficulty hearing her television later in the day, and she notices her radio is loud when she turns on her car in the morning. She finds herself saying "huh" to her soft spoken friends. This difficulty began gradually. No pain or pressure reported in either ear.  No tinnitus present in either ear. [name] has a history of noise exposure from concerts but not recently.  Medical history positive for diabetes which is a risk factor for hearing loss. No other relevant case history reported.    Evaluation:   Otoscopy showed a clear view of the tympanic membranes, bilaterally  Tympanometry results were consistent with normal function of the middle ear, bilaterally   Audiometric testing was completed using conventional audiometry with insert ER3A transducer. Speech Detection Thresholds were consistent with pure tone averages. Word Recognition was excellent at conversation level. Pure tone thresholds show normal to mild sensrineiral hearing loss in both ears.  Results:  The test results were reviewed with Butch Penny. She was provided a copy of her audiogram. The degree and nature of her hearing loss was explained. She is not a candidate for hearing aids at this time due to very mild degree of loss and good ability to repeat words at conversational level. She was advised that fatigue can cause increased difficulty understanding at the end of the day, but her hearing remains normal. She reported understanding all  that was discussed.    Recommendations: 1. Progression of hearing loss needs to be monitored every other year due to health risk factors for hearing loss.      Alfonse Alpers  Audiologist, Au.D., CCC-A 07/13/2019  8:50 AM  Cc: Rutherford Guys, MD

## 2019-09-27 DIAGNOSIS — R69 Illness, unspecified: Secondary | ICD-10-CM | POA: Diagnosis not present

## 2019-11-05 DIAGNOSIS — Z1231 Encounter for screening mammogram for malignant neoplasm of breast: Secondary | ICD-10-CM | POA: Diagnosis not present

## 2019-11-05 LAB — HM MAMMOGRAPHY

## 2019-12-01 DIAGNOSIS — J3089 Other allergic rhinitis: Secondary | ICD-10-CM | POA: Diagnosis not present

## 2019-12-01 DIAGNOSIS — J4530 Mild persistent asthma, uncomplicated: Secondary | ICD-10-CM | POA: Diagnosis not present

## 2019-12-01 DIAGNOSIS — J300 Vasomotor rhinitis: Secondary | ICD-10-CM | POA: Diagnosis not present

## 2019-12-28 ENCOUNTER — Ambulatory Visit: Payer: Medicare HMO | Admitting: Family Medicine

## 2019-12-29 ENCOUNTER — Other Ambulatory Visit: Payer: Self-pay

## 2019-12-29 ENCOUNTER — Encounter: Payer: Self-pay | Admitting: Family Medicine

## 2019-12-29 ENCOUNTER — Ambulatory Visit (INDEPENDENT_AMBULATORY_CARE_PROVIDER_SITE_OTHER): Payer: Medicare HMO | Admitting: Family Medicine

## 2019-12-29 VITALS — BP 152/73 | HR 80 | Temp 98.3°F | Ht 65.0 in | Wt 195.0 lb

## 2019-12-29 DIAGNOSIS — R809 Proteinuria, unspecified: Secondary | ICD-10-CM

## 2019-12-29 DIAGNOSIS — D75839 Thrombocytosis, unspecified: Secondary | ICD-10-CM | POA: Diagnosis not present

## 2019-12-29 DIAGNOSIS — E785 Hyperlipidemia, unspecified: Secondary | ICD-10-CM | POA: Diagnosis not present

## 2019-12-29 DIAGNOSIS — M109 Gout, unspecified: Secondary | ICD-10-CM

## 2019-12-29 DIAGNOSIS — E1129 Type 2 diabetes mellitus with other diabetic kidney complication: Secondary | ICD-10-CM

## 2019-12-29 DIAGNOSIS — Z9081 Acquired absence of spleen: Secondary | ICD-10-CM

## 2019-12-29 MED ORDER — ALLOPURINOL 100 MG PO TABS: 100.0000 mg | ORAL_TABLET | Freq: Two times a day (BID) | ORAL | 1 refills | Status: DC

## 2019-12-29 MED ORDER — LOSARTAN POTASSIUM 50 MG PO TABS: 25.0000 mg | ORAL_TABLET | Freq: Every day | ORAL | 1 refills | Status: DC

## 2019-12-29 NOTE — Progress Notes (Signed)
Subjective:  Patient ID: Sabrina Knight, female    DOB: [birth year]  Age: 69 y.o. MRN: [id number]  CC:  Chief Complaint  Patient presents with  . Follow-up    on diabetes and discuss a intolorance to pt's Lisinopril medication.  PT reports no issues with BS since last OV.  Marland [facility] [employer]    Type2 Diabetes IDC-10- [id number], Therombocythemia ICD-10: D473    HPI Sabrina Knight presents for  Diabetes, med review.  Plans on establishing primary care with Huston Foley just, NP in June 2022.   Diabetes: With history of hyperglycemia.  Last A1c 6.6 in June, slight increase from December 2020.  The diet control continued.  She is on statin, and has been started on lisinopril 2.5 mg daily for elevated microalbumin creatinine ratio of 30 on June 3. Took for a few months. Has allergies, but had a new cough. Stopped lisinopril and cough resolved.  Microalbumin: 30 on 06/24/2019 2 hours exercise 3 days per week - water based exercise. Rare sweet tea no soda. 2 sugars in coffee in am.  Chocolate daily.  Optho, foot exam, pneumovax:  Plans to schedule diabetic eye exam. Up to date otherwise.  FH of diabetes - 2 brothers, one on insulin.  Lab Results  Component Value Date   HGBA1C 6.6 (H) 06/24/2019   HGBA1C 6.2 (A) 01/04/2019   HGBA1C 6.5 (A) 06/23/2018   Lab Results  Component Value Date   LDLCALC 98 06/24/2019   CREATININE 0.86 06/24/2019   Thrombocytosis: Improving on June 3 labs.  Reading of 503 in September 2019. Hx of splenectomy in 1996 for ITP.  Lab Results  Component Value Date   WBC 9.1 06/24/2019   HGB 14.8 06/24/2019   HCT 45.8 06/24/2019   MCV 94 06/24/2019   PLT 485 (H) 06/24/2019   Hyperlipidemia: Crestor 10 mg QOD - no new side effects on this dosing.  Lab Results  Component Value Date   CHOL 178 06/24/2019   HDL 49 06/24/2019   LDLCALC 98 06/24/2019   TRIG 180 (H) 06/24/2019   CHOLHDL 3.6 06/24/2019   Lab Results  Component Value Date   ALT 30 06/24/2019   AST  27 06/24/2019   ALKPHOS 108 06/24/2019   BILITOT 0.3 06/24/2019   Elevated blood pressure Not on meds, readings 130/70 at [facility].  BP Readings from Last 3 Encounters:  12/29/19 (!) 152/73  06/29/19 127/68  05/17/19 112/62   Lab Results  Component Value Date   CREATININE 0.86 06/24/2019   Gout: Last flare: 3 years ago.  Daily meds: allopurinol BID, no side effects working well. Felt like close to flair on once per day.  Prn med:  Lab Results  Component Value Date   LABURIC 7.0 04/07/2017        History Patient Active Problem List   Diagnosis Date Noted  . Mild persistent asthma, uncomplicated 44/03/4740  . Vasomotor rhinitis 01/08/2019  . Other allergic rhinitis 01/08/2019  . Bilateral carpal tunnel syndrome 09/25/2018  . Dyslipidemia 10/22/2017  . Statin intolerance 10/22/2017  . Newly diagnosed diabetes ([place]) 10/22/2017  . Gout 12/23/2016  . Class 1 obesity due to excess calories with serious comorbidity and body mass index (BMI) of 31.0 to 31.9 in adult 05/19/2016  . Osteopenia of necks of both femurs 05/19/2016  . Thrombocytosis 02/08/2014  . High cholesterol 02/01/2013  . H/O splenectomy 02/01/2013  . Non-allergic rhinitis 02/01/2013  . Glaucoma 02/01/2013   Past Medical History:  Diagnosis Date  .  Allergy    Zyrtec, Singulair; allergy testing negative; Orvil Feil.  . Arthritis   . Asthma due to seasonal allergies   . Cataract   . Clotting disorder ([place])   . Glaucoma   . Gout   . ITP (idiopathic thrombocytopenic purpura)    Past Surgical History:  Procedure Laterality Date  . ABDOMINAL HYSTERECTOMY  01/22/1999   ovaries resected; DUB/fibroids  . CARPAL TUNNEL RELEASE Right 09/11/2018   Procedure: CARPAL TUNNEL RELEASE;  Surgeon: Daryll Brod, MD;  Location: [place];  Service: Orthopedics;  Laterality: Right;  . CARPAL TUNNEL RELEASE Left 10/02/2018   Procedure: CARPAL TUNNEL RELEASE;  Surgeon: Daryll Brod, MD;  Location: [place];  Service: Orthopedics;  Laterality: Left;  FAB  . EYE SURGERY N/A    [employer] 06/26/2019  . 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 spleen removal     Allergies  Allergen Reactions  . Lisinopril Cough   Prior to Admission medications   Medication Sig Start Date End Date Taking? Authorizing Provider  allopurinol (ZYLOPRIM) 100 MG tablet Take 1 tablet (100 mg total) by mouth 2 (two) times daily. 06/29/19  Yes Rutherford Guys, MD  cetirizine (ZYRTEC) 10 MG tablet Take 10 mg by mouth daily.   Yes [provider]  co-enzyme Q-10 30 MG capsule Take by mouth.   Yes [provider]  cyanocobalamin 100 MCG tablet Take by mouth.   Yes [provider]  fexofenadine (ALLEGRA) 180 MG tablet TAKE 1 TABLET BY MOUTH EVERY DAY 10/28/16  Yes [provider]  Javier Docker Oil 300 MG CAPS Take by mouth.   Yes [provider]  montelukast (SINGULAIR) 10 MG tablet Take 10 mg by mouth daily. 01/07/13  Yes [provider]  Multiple Vitamin (MULTIVITAMIN WITH MINERALS) TABS tablet Take 1 tablet by mouth daily.   Yes [provider]  rosuvastatin (CRESTOR) 10 MG tablet Take 1 tablet (10 mg total) by mouth every other day. 06/29/19  Yes Rutherford Guys, MD  VITAMIN D, CHOLECALCIFEROL, PO Take by mouth.   Yes [provider]  vitamin E (VITAMIN E) 180 MG (400 UNITS) capsule Take 400 Units by mouth daily.   Yes [provider]   Social History   Socioeconomic History  . Marital status: Widowed    Spouse name: Not on file  . Number of children: 0  . Years of education: Not on file  . Highest education level: Not on file  Occupational History  . Occupation: Retired  Tobacco Use  . Smoking status: Former Smoker    Packs/day: 0.50    Quit date: 12/04/2003    Years since quitting: 16.0  . Smokeless tobacco: Never Used  Vaping Use  . Vaping Use: Never used  Substance and Sexual Activity  . Alcohol use: No    Alcohol/week: 0.0  standard drinks  . Drug use: No  . Sexual activity: Not Currently    Birth control/protection: Surgical  Other Topics Concern  . Not on file  Social History Narrative   Marital status:  Widowed x since 2001 from [place]; married x 27 years.  Not dating really in 2019.      Children:  None; failed in vitro.      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      Tobacco: quit ; smoked 20 years.      Alcohol: none  Education: [employer].       Exercise: Yes; 3 times per week 2 hours deep water aerobics.      ADLs: independent ADLs; no assistant devices.      Advanced Directives:  None; FULL CODE; no prolonged measures.    HCPOA: Darlene Jackson/sister-in-law.     Social Determinants of Health   Financial Resource Strain:   . Difficulty of Paying Living Expenses: Not on file  Food Insecurity:   . Worried About Charity fundraiser in the Last Year: Not on file  . Ran Out of Food in the Last Year: Not on file  Transportation Needs:   . Lack of Transportation (Medical): Not on file  . Lack of Transportation (Non-Medical): Not on file  Physical Activity:   . Days of Exercise per Week: Not on file  . Minutes of Exercise per Session: Not on file  Stress:   . Feeling of Stress : Not on file  Social Connections:   . Frequency of Communication with Friends and Family: Not on file  . Frequency of Social Gatherings with Friends and Family: Not on file  . Attends Religious Services: Not on file  . Active Member of Clubs or Organizations: Not on file  . Attends Archivist Meetings: Not on file  . Marital Status: Not on file  Intimate Partner Violence:   . Fear of Current or Ex-Partner: Not on file  . Emotionally Abused: Not on file  . Physically Abused: Not on file  . Sexually Abused: Not on file    Review of Systems  Constitutional: Negative for fatigue and unexpected weight change.  Respiratory: Negative for chest tightness and  shortness of breath.   Cardiovascular: Negative for chest pain, palpitations and leg swelling.  Gastrointestinal: Negative for abdominal pain and blood in stool.  Musculoskeletal: Negative for arthralgias.  Neurological: Negative for dizziness, syncope, light-headedness and headaches.    Objective:   Vitals:   12/29/19 1009 12/29/19 1022  BP: (!) 162/79 (!) 152/73  Pulse: 80   Temp: 98.3 F (36.8 C)   TempSrc: Temporal   SpO2: 95%   Weight: 195 lb (88.5 kg)   Height: 5\' 5"  (1.651 m)      Physical Exam Vitals reviewed.  Constitutional:      Appearance: She is well-developed.  HENT:     Head: Normocephalic and atraumatic.  Eyes:     Conjunctiva/sclera: Conjunctivae normal.     Pupils: Pupils are equal, round, and reactive to light.  Neck:     Vascular: No carotid bruit.  Cardiovascular:     Rate and Rhythm: Normal rate and regular rhythm.     Heart sounds: Normal heart sounds.  Pulmonary:     Effort: Pulmonary effort is normal.     Breath sounds: Normal breath sounds.  Abdominal:     Palpations: Abdomen is soft. There is no pulsatile mass.     Tenderness: There is no abdominal tenderness.  Skin:    General: Skin is warm and dry.  Neurological:     Mental Status: She is alert and oriented to person, place, and time.  Psychiatric:        Behavior: Behavior normal.     Assessment & Plan:  Sabrina Knight is a 69 y.o. female . Type 2 diabetes mellitus with microalbuminuria, without long-term current use of insulin (HCC) - Plan: Hemoglobin A1c, losartan (COZAAR) 50 MG tablet  -Check A1c, add low-dose losartan for microalbuminuria as well as improved  blood pressure treatment.  Avoid ACE inhibitor with prior cough.  Recheck 6 months.  H/O splenectomy - Plan: CBC Thrombocytosis - Plan: CBC  -Improving prior.  Repeat CBC  Hyperlipidemia, unspecified hyperlipidemia type - Plan: Comprehensive metabolic panel, Lipid panel  -Tolerating statin, continue same.  Check  labs  Gout, unspecified cause, unspecified chronicity, unspecified site - Plan: Uric Acid, allopurinol (ZYLOPRIM) 100 MG tablet  -Controlled with current regimen, check uric acid, refilled allopurinol same dose.  RTC precautions  Meds ordered this encounter  Medications  . losartan (COZAAR) 50 MG tablet    Sig: Take 0.5 tablets (25 mg total) by mouth daily.    Dispense:  45 tablet    Refill:  1  . allopurinol (ZYLOPRIM) 100 MG tablet    Sig: Take 1 tablet (100 mg total) by mouth 2 (two) times daily.    Dispense:  180 tablet    Refill:  1   Patient Instructions   Be careful with chocolate and any sweetened beverages.  Try new blood pressure medication that will help protein in the urine.  That may also help, borderline blood pressure readings.  If you notice readings outside of the office over 140/90 on that medication, please follow-up to discuss blood pressure further. No medication changes at this time.  I will refill your medications, follow-up in 6 months with Kelsea.  Let us know if there are questions prior to that time.   If you have lab work done today you will be contacted with your lab results within the next 2 weeks.  If you have not heard from [place] then please contact us. The fastest way to get your results is to register for My Chart.   IF you received an x-ray today, you will receive an invoice from [facility]. Please contact [facility] Radiology at [phone] with questions or concerns regarding your invoice.   IF you received labwork today, you will receive an invoice from [facility]. Please contact [facility] at [phone] with questions or concerns regarding your invoice.   Our billing staff will not be able to assist you with questions regarding bills from these companies.  You will be contacted with the lab results as soon as they are available. The fastest way to get your results is to activate your My Chart account. Instructions are located on the last  page of this paperwork. If you have not heard from [place] regarding the results in 2 weeks, please contact this office.         Signed, Merri Ray, MD [facility]

## 2019-12-29 NOTE — Patient Instructions (Addendum)
Be careful with chocolate and any sweetened beverages.  Try new blood pressure medication that will help protein in the urine.  That may also help, borderline blood pressure readings.  If you notice readings outside of the office over 140/90 on that medication, please follow-up to discuss blood pressure further. No medication changes at this time.  I will refill your medications, follow-up in 6 months with Kelsea.  Let us know if there are questions prior to that time.   If you have lab work done today you will be contacted with your lab results within the next 2 weeks.  If you have not heard from [place] then please contact us. The fastest way to get your results is to register for My Chart.   IF you received an x-ray today, you will receive an invoice from [facility] Radiology. Please contact [facility] Radiology at [phone] with questions or concerns regarding your invoice.   IF you received labwork today, you will receive an invoice from [place]. Please contact [facility] at [phone] with questions or concerns regarding your invoice.   Our billing staff will not be able to assist you with questions regarding bills from these companies.  You will be contacted with the lab results as soon as they are available. The fastest way to get your results is to activate your My Chart account. Instructions are located on the last page of this paperwork. If you have not heard from [place] regarding the results in 2 weeks, please contact this office.

## 2019-12-30 LAB — COMPREHENSIVE METABOLIC PANEL WITH GFR
ALT: 48 IU/L — ABNORMAL HIGH (ref 0–32)
AST: 44 IU/L — ABNORMAL HIGH (ref 0–40)
Albumin/Globulin Ratio: 1.6 (ref 1.2–2.2)
Albumin: 4.4 g/dL (ref 3.8–4.8)
Alkaline Phosphatase: 105 IU/L (ref 44–121)
BUN/Creatinine Ratio: 12 (ref 12–28)
BUN: 9 mg/dL (ref 8–27)
Bilirubin Total: 0.3 mg/dL (ref 0.0–1.2)
CO2: 22 mmol/L (ref 20–29)
Calcium: 9.8 mg/dL (ref 8.7–10.3)
Chloride: 105 mmol/L (ref 96–106)
Creatinine, Ser: 0.78 mg/dL (ref 0.57–1.00)
GFR calc Af Amer: 89 mL/min/1.73
GFR calc non Af Amer: 77 mL/min/1.73
Globulin, Total: 2.7 g/dL (ref 1.5–4.5)
Glucose: 97 mg/dL (ref 65–99)
Potassium: 4.4 mmol/L (ref 3.5–5.2)
Sodium: 141 mmol/L (ref 134–144)
Total Protein: 7.1 g/dL (ref 6.0–8.5)

## 2019-12-30 LAB — LIPID PANEL
Chol/HDL Ratio: 3.9 ratio (ref 0.0–4.4)
Cholesterol, Total: 204 mg/dL — ABNORMAL HIGH (ref 100–199)
HDL: 52 mg/dL (ref >39)
LDL Chol Calc (NIH): 120 mg/dL — ABNORMAL HIGH (ref 0–99)
Triglycerides: 180 mg/dL — ABNORMAL HIGH (ref 0–149)
VLDL Cholesterol Cal: 32 mg/dL (ref 5–40)

## 2019-12-30 LAB — CBC
Hematocrit: 44.9 % (ref 34.0–46.6)
Hemoglobin: 14.9 g/dL (ref 11.1–15.9)
MCH: 30.7 pg (ref 26.6–33.0)
MCHC: 33.2 g/dL (ref 31.5–35.7)
MCV: 93 fL (ref 79–97)
Platelets: 472 10*3/uL — ABNORMAL HIGH (ref 150–450)
RBC: 4.85 x10E6/uL (ref 3.77–5.28)
RDW: 12.5 % (ref 11.7–15.4)
WBC: 11.9 10*3/uL — ABNORMAL HIGH (ref 3.4–10.8)

## 2019-12-30 LAB — URIC ACID: Uric Acid: 5.5 mg/dL (ref 3.0–7.2)

## 2019-12-30 LAB — HEMOGLOBIN A1C
Est. average glucose Bld gHb Est-mCnc: 146 mg/dL
Hgb A1c MFr Bld: 6.7 % — ABNORMAL HIGH (ref 4.8–5.6)

## 2020-01-07 DIAGNOSIS — Z20822 Contact with and (suspected) exposure to covid-19: Secondary | ICD-10-CM | POA: Diagnosis not present

## 2020-01-18 DIAGNOSIS — Z20822 Contact with and (suspected) exposure to covid-19: Secondary | ICD-10-CM | POA: Diagnosis not present

## 2020-02-23 ENCOUNTER — Encounter: Payer: Self-pay | Admitting: Family Medicine

## 2020-02-23 ENCOUNTER — Ambulatory Visit (INDEPENDENT_AMBULATORY_CARE_PROVIDER_SITE_OTHER): Payer: Medicare HMO | Admitting: Family Medicine

## 2020-02-23 ENCOUNTER — Other Ambulatory Visit: Payer: Self-pay

## 2020-02-23 VITALS — BP 130/80 | HR 72 | Temp 98.5°F | Ht 65.0 in | Wt 192.0 lb

## 2020-02-23 DIAGNOSIS — D75839 Thrombocytosis, unspecified: Secondary | ICD-10-CM

## 2020-02-23 DIAGNOSIS — R809 Proteinuria, unspecified: Secondary | ICD-10-CM | POA: Diagnosis not present

## 2020-02-23 DIAGNOSIS — E1129 Type 2 diabetes mellitus with other diabetic kidney complication: Secondary | ICD-10-CM

## 2020-02-23 DIAGNOSIS — E785 Hyperlipidemia, unspecified: Secondary | ICD-10-CM | POA: Diagnosis not present

## 2020-02-23 DIAGNOSIS — Z9081 Acquired absence of spleen: Secondary | ICD-10-CM | POA: Diagnosis not present

## 2020-02-23 DIAGNOSIS — D72829 Elevated white blood cell count, unspecified: Secondary | ICD-10-CM | POA: Diagnosis not present

## 2020-02-23 DIAGNOSIS — R7989 Other specified abnormal findings of blood chemistry: Secondary | ICD-10-CM

## 2020-02-23 MED ORDER — ROSUVASTATIN CALCIUM 10 MG PO TABS: ORAL_TABLET | ORAL | 1 refills | Status: DC

## 2020-02-23 MED ORDER — MONTELUKAST SODIUM 10 MG PO TABS
10.0000 mg | ORAL_TABLET | Freq: Every day | ORAL | 3 refills | Status: DC
Start: 2020-02-23 — End: 2021-02-07

## 2020-02-23 NOTE — Patient Instructions (Addendum)
  I will check liver tests again today. If stable, can try 1-2 extra days of crestor a week. If new side effects at higher dose, return to every other day. Either way should repeat liver tests in another 6 weeks. That can be a lab visit.   If any new symptoms, fevers, or new illness be seen sooner.  Keep appointment in June for routine follow up with [facility].    If you have lab work done today you will be contacted with your lab results within the next 2 weeks.  If you have not heard from [place] then please contact us. The fastest way to get your results is to register for My Chart.   IF you received an x-ray today, you will receive an invoice from [facility] Radiology. Please contact [facility] Radiology at [phone] with questions or concerns regarding your invoice.   IF you received labwork today, you will receive an invoice from [facility]. Please contact [facility] at [phone] with questions or concerns regarding your invoice.   Our billing staff will not be able to assist you with questions regarding bills from these companies.  You will be contacted with the lab results as soon as they are available. The fastest way to get your results is to activate your My Chart account. Instructions are located on the last page of this paperwork. If you have not heard from [place] regarding the results in 2 weeks, please contact this office.

## 2020-02-23 NOTE — Progress Notes (Signed)
Subjective:  Patient ID: Sabrina Knight, female    DOB: [birth year]  Age: 70 y.o. MRN: [id number]  CC:  Chief Complaint  Patient presents with  . Follow-up    On Recent lab work and recent BP medication change. Pt reports no issues with BP since last OV and no physical symptoms to report. Pt reports no new illness or cold symptoms pt has been isolated due to caring for a loved one. Pt reports no issues with BS since last OV. Pt states her home BS readings have been around 112 mg/dL fasting. Pt is currently fasting.    HPI Sabrina Knight presents for   Hypertension: Elevated blood pressure last visit, with type 2 diabetes, microalbuminuria, started on losartan 50 mg daily.  Avoiding ACE inhibitor as prior cough. Feels fine on losartan. No new side effects.  Home readings: no home readings.  BP Readings from Last 3 Encounters:  02/23/20 130/80  12/29/19 (!) 152/73  06/29/19 127/68   Lab Results  Component Value Date   CREATININE 0.78 12/29/2019   Hyperlipidemia: LDL increased from June 2021 to December, 98-120. Taking crestor QOD. No missed doses. Unknown if intolerance to daily dosing in past?  Less exercise past month. Isolating, brother home after covid pneumonia. 3 Wt Readings from Last 3 Encounters:  02/23/20 192 lb (87.1 kg)  12/29/19 195 lb (88.5 kg)  06/29/19 194 lb (88 kg)     Lab Results  Component Value Date   CHOL 204 (H) 12/29/2019   HDL 52 12/29/2019   LDLCALC 120 (H) 12/29/2019   TRIG 180 (H) 12/29/2019   CHOLHDL 3.9 12/29/2019   Lab Results  Component Value Date   ALT 48 (H) 12/29/2019   AST 44 (H) 12/29/2019   ALKPHOS 105 12/29/2019   BILITOT 0.3 12/29/2019   Elevated LFTs Slight elevation of ALT, AST as above, 48, 44.  Previously 30,27 in June 2021.no abd pain, n/v/jaundice/scleral icterus.  Leukocytosis, thrombocytosis WBC increased from 9.1 in June 20 21-11.9 last visit.  History of splenectomy.  Platelets have ranged in the high 400s to low  500s.  Most recently 70. No fever, no recent illness.   Lab Results  Component Value Date   WBC 11.9 (H) 12/29/2019   HGB 14.9 12/29/2019   HCT 44.9 12/29/2019   MCV 93 12/29/2019   PLT 472 (H) 12/29/2019    History Patient Active Problem List   Diagnosis Date Noted  . Mild persistent asthma, uncomplicated 09/32/6712  . Vasomotor rhinitis 01/08/2019  . Other allergic rhinitis 01/08/2019  . Bilateral carpal tunnel syndrome 09/25/2018  . Dyslipidemia 10/22/2017  . Statin intolerance 10/22/2017  . Newly diagnosed diabetes ([place]) 10/22/2017  . Gout 12/23/2016  . Class 1 obesity due to excess calories with serious comorbidity and body mass index (BMI) of 31.0 to 31.9 in adult 05/19/2016  . Osteopenia of necks of both femurs 05/19/2016  . Thrombocytosis 02/08/2014  . High cholesterol 02/01/2013  . H/O splenectomy 02/01/2013  . Non-allergic rhinitis 02/01/2013  . Glaucoma 02/01/2013   Past Medical History:  Diagnosis Date  . Allergy    Zyrtec, Singulair; allergy testing negative; Orvil Feil.  . Arthritis   . Asthma due to seasonal allergies   . Cataract   . Clotting disorder ([place])   . Glaucoma   . Gout   . ITP (idiopathic thrombocytopenic purpura)    Past Surgical History:  Procedure Laterality Date  . ABDOMINAL HYSTERECTOMY  01/22/1999   ovaries resected; DUB/fibroids  .  CARPAL TUNNEL RELEASE Right 09/11/2018   Procedure: CARPAL TUNNEL RELEASE;  Surgeon: Daryll Brod, MD;  Location: [facility];  Service: Orthopedics;  Laterality: Right;  . CARPAL TUNNEL RELEASE Left 10/02/2018   Procedure: CARPAL TUNNEL RELEASE;  Surgeon: Daryll Brod, MD;  Location: [facility];  Service: Orthopedics;  Laterality: Left;  FAB  . EYE SURGERY N/A    Phreesia 06/26/2019  . 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 spleen removal     Allergies  Allergen Reactions  . Lisinopril Cough   Prior to Admission medications   Medication Sig Start Date End Date Taking?  Authorizing Provider  allopurinol (ZYLOPRIM) 100 MG tablet Take 1 tablet (100 mg total) by mouth 2 (two) times daily. 12/29/19  Yes Wendie Agreste, MD  cetirizine (ZYRTEC) 10 MG tablet Take 10 mg by mouth daily.   Yes [provider]  co-enzyme Q-10 30 MG capsule Take by mouth.   Yes [provider]  cyanocobalamin 100 MCG tablet Take by mouth.   Yes [provider]  fexofenadine (ALLEGRA) 180 MG tablet TAKE 1 TABLET BY MOUTH EVERY DAY 10/28/16  Yes [provider]  Javier Docker Oil 300 MG CAPS Take by mouth.   Yes [provider]  losartan (COZAAR) 50 MG tablet Take 0.5 tablets (25 mg total) by mouth daily. 12/29/19  Yes Wendie Agreste, MD  montelukast (SINGULAIR) 10 MG tablet Take 10 mg by mouth daily. 01/07/13  Yes [provider]  Multiple Vitamin (MULTIVITAMIN WITH MINERALS) TABS tablet Take 1 tablet by mouth daily.   Yes [provider]  rosuvastatin (CRESTOR) 10 MG tablet Take 1 tablet (10 mg total) by mouth every other day. 06/29/19  Yes Jacelyn Pi, Lilia Argue, MD  VITAMIN D, CHOLECALCIFEROL, PO Take by mouth.   Yes [provider]  vitamin E 180 MG (400 UNITS) capsule Take 400 Units by mouth daily.   Yes [provider]   Social History   Socioeconomic History  . Marital status: Widowed    Spouse name: Not on file  . Number of children: 0  . Years of education: Not on file  . Highest education level: Not on file  Occupational History  . Occupation: Retired  Tobacco Use  . Smoking status: Former Smoker    Packs/day: 0.50    Quit date: 12/04/2003    Years since quitting: 16.2  . Smokeless tobacco: Never Used  Vaping Use  . Vaping Use: Never used  Substance and Sexual Activity  . Alcohol use: No    Alcohol/week: 0.0 standard drinks  . Drug use: No  . Sexual activity: Not Currently    Birth control/protection: Surgical  Other Topics Concern  . Not on file  Social History Narrative   Marital status:   Widowed x since 2001 from [place]; married x 27 years.  Not dating really in 2019.      Children:  None; failed in vitro.      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      Tobacco: quit ; smoked 20 years.      Alcohol: none      Education: [employer].       Exercise: Yes; 3 times per week 2 hours deep water aerobics.      ADLs: independent ADLs; no assistant devices.      Advanced Directives:  None; FULL CODE; no prolonged measures.  HCPOA: Darlene Jackson/sister-in-law.     Social Determinants of Health   Financial Resource Strain: Not on file  Food Insecurity: Not on file  Transportation Needs: Not on file  Physical Activity: Not on file  Stress: Not on file  Social Connections: Not on file  Intimate Partner Violence: Not on file    Review of Systems   Objective:   Vitals:   02/23/20 0924 02/23/20 0930  BP: (!) 168/73 130/80  Pulse: 72   Temp: 98.5 F (36.9 C)   TempSrc: Temporal   SpO2: 97%   Weight: 192 lb (87.1 kg)   Height: 5\' 5"  (1.651 m)      Physical Exam Vitals reviewed.  Constitutional:      Appearance: Sabrina Knight is well-developed and well-nourished.  HENT:     Head: Normocephalic and atraumatic.  Eyes:     Extraocular Movements: EOM normal.     Conjunctiva/sclera: Conjunctivae normal.     Pupils: Pupils are equal, round, and reactive to light.  Neck:     Vascular: No carotid bruit.  Cardiovascular:     Rate and Rhythm: Normal rate and regular rhythm.     Pulses: Intact distal pulses.     Heart sounds: Normal heart sounds.  Pulmonary:     Effort: Pulmonary effort is normal.     Breath sounds: Normal breath sounds.  Abdominal:     General: There is no distension.     Palpations: Abdomen is soft. There is no pulsatile mass.     Tenderness: There is no abdominal tenderness. There is no guarding.  Skin:    General: Skin is warm and dry.  Neurological:     Mental Status: Sabrina Knight is alert and oriented to person,  place, and time.  Psychiatric:        Mood and Affect: Mood and affect normal.        Behavior: Behavior normal.    30 minutes spent during visit, greater than 50% counseling and assimilation of information, chart review, and discussion of plan.    Assessment & Plan:  AMINA MENCHACA is a 70 y.o. female . Type 2 diabetes mellitus with microalbuminuria, without long-term current use of insulin (HCC)  -Tolerating current regimen including losartan, improved blood pressure control.  Continue same.  Leukocytosis, unspecified type - Plan: CBC with Differential/Platelet  -Mild on December labs without signs or symptoms of infection.  Repeat CBC.  RTC precautions.  Elevated LFTs - Plan: Hepatic Function Panel, Hepatic Function Panel  -Mild elevations, some decreased exercise may be contributing.  Repeat testing today as well as in 6 weeks especially if Sabrina Knight increases statin dosing.  H/O splenectomy Thrombocytosis  -Repeat CBC.  Hyperlipidemia, unspecified hyperlipidemia type - Plan: rosuvastatin (CRESTOR) 10 MG tablet  -Slight elevation, will try adding 1-2 additional doses per week.  Monitor LFTs in 6 weeks.  Meds ordered this encounter  Medications  . montelukast (SINGULAIR) 10 MG tablet    Sig: Take 1 tablet (10 mg total) by mouth daily.    Dispense:  90 tablet    Refill:  3  . rosuvastatin (CRESTOR) 10 MG tablet    Sig: 1 tab po qpm 5 days per week.    Dispense:  60 tablet    Refill:  1   Patient Instructions    I will check liver tests again today. If stable, can try 1-2 extra days of crestor a week. If new side effects at higher dose, return to every other day. Either way  should repeat liver tests in another 6 weeks. That can be a lab visit.   If any new symptoms, fevers, or new illness be seen sooner.  Keep appointment in June for routine follow up with [facility].    If you have lab work done today you will be contacted with your lab results within the next 2 weeks.  If  you have not heard from [place] then please contact us. The fastest way to get your results is to register for My Chart.   IF you received an x-ray today, you will receive an invoice from [facility]. Please contact [facility] at [phone] with questions or concerns regarding your invoice.   IF you received labwork today, you will receive an invoice from [facility]. Please contact [facility] at [phone] with questions or concerns regarding your invoice.   Our billing staff will not be able to assist you with questions regarding bills from these companies.  You will be contacted with the lab results as soon as they are available. The fastest way to get your results is to activate your My Chart account. Instructions are located on the last page of this paperwork. If you have not heard from [place] regarding the results in 2 weeks, please contact this office.         Signed, Merri Ray, MD [facility]

## 2020-02-24 LAB — CBC WITH DIFFERENTIAL/PLATELET
Basophils Absolute: 0.1 10*3/uL (ref 0.0–0.2)
Basos: 1 %
EOS (ABSOLUTE): 0.2 10*3/uL (ref 0.0–0.4)
Eos: 2 %
Hematocrit: 44.7 % (ref 34.0–46.6)
Hemoglobin: 15 g/dL (ref 11.1–15.9)
Immature Grans (Abs): 0 10*3/uL (ref 0.0–0.1)
Immature Granulocytes: 0 %
Lymphocytes Absolute: 4.2 10*3/uL — ABNORMAL HIGH (ref 0.7–3.1)
Lymphs: 41 %
MCH: 30.7 pg (ref 26.6–33.0)
MCHC: 33.6 g/dL (ref 31.5–35.7)
MCV: 91 fL (ref 79–97)
Monocytes Absolute: 1.1 10*3/uL — ABNORMAL HIGH (ref 0.1–0.9)
Monocytes: 11 %
Neutrophils Absolute: 4.6 10*3/uL (ref 1.4–7.0)
Neutrophils: 45 %
Platelets: 485 10*3/uL — ABNORMAL HIGH (ref 150–450)
RBC: 4.89 x10E6/uL (ref 3.77–5.28)
RDW: 12.7 % (ref 11.7–15.4)
WBC: 10.2 10*3/uL (ref 3.4–10.8)

## 2020-02-24 LAB — HEPATIC FUNCTION PANEL
ALT: 40 IU/L — ABNORMAL HIGH (ref 0–32)
AST: 29 IU/L (ref 0–40)
Albumin: 4.4 g/dL (ref 3.8–4.8)
Alkaline Phosphatase: 114 IU/L (ref 44–121)
Bilirubin Total: 0.3 mg/dL (ref 0.0–1.2)
Bilirubin, Direct: 0.11 mg/dL (ref 0.00–0.40)
Total Protein: 7.3 g/dL (ref 6.0–8.5)

## 2020-03-01 NOTE — Progress Notes (Signed)
Lab letter sent 

## 2020-04-05 ENCOUNTER — Other Ambulatory Visit: Payer: Self-pay

## 2020-04-05 ENCOUNTER — Ambulatory Visit (INDEPENDENT_AMBULATORY_CARE_PROVIDER_SITE_OTHER): Payer: Medicare HMO | Admitting: Family Medicine

## 2020-04-05 DIAGNOSIS — R7989 Other specified abnormal findings of blood chemistry: Secondary | ICD-10-CM | POA: Diagnosis not present

## 2020-04-06 LAB — HEPATIC FUNCTION PANEL
ALT: 40 IU/L — ABNORMAL HIGH (ref 0–32)
AST: 28 IU/L (ref 0–40)
Albumin: 4.3 g/dL (ref 3.8–4.8)
Alkaline Phosphatase: 105 IU/L (ref 44–121)
Bilirubin Total: 0.2 mg/dL (ref 0.0–1.2)
Bilirubin, Direct: <0.1 mg/dL (ref 0.00–0.40)
Total Protein: 7.1 g/dL (ref 6.0–8.5)

## 2020-05-22 ENCOUNTER — Other Ambulatory Visit: Payer: Self-pay | Admitting: Family Medicine

## 2020-05-22 DIAGNOSIS — E785 Hyperlipidemia, unspecified: Secondary | ICD-10-CM

## 2020-05-25 NOTE — Progress Notes (Signed)
[facility] [address] [place] Phone: [phone] Subjective:   I Kandace Blitz am serving as a Education administrator for Dr. Hulan Saas.  This visit occurred during the SARS-CoV-2 public health emergency.  Safety protocols were in place, including screening questions prior to the visit, additional usage of staff PPE, and extensive cleaning of exam room while observing appropriate contact time as indicated for disinfecting solutions.   I'm seeing this patient by the request  of:  Jacelyn Pi, Lilia Argue, MD  CC: Multiple complaints  [id number]  DELIYAH Knight is a 70 y.o. female coming in with complaint of L knee pain. Patient states she has bilateral bursitis in her hips. Doesn't have pain all the time but she states it keeps her up at night. With standing she states the right glut feels as if it is going to sleep. 2020 she fell down the steps and fell on the right glut and shoulder and states she doesn't know her body anymore. Does water aerobics.   Onset- chronic 6 months Location - anterior, lateral  Character- sharp Aggravating factors- walking, extension Reliving factors-  Therapies tried- heat, ice  Severity- 6-7/10 at its worse     Past Medical History:  Diagnosis Date  . Allergy    Zyrtec, Singulair; allergy testing negative; Orvil Feil.  . Arthritis   . Asthma due to seasonal allergies   . Cataract   . Clotting disorder ([place])   . Glaucoma   . Gout   . ITP (idiopathic thrombocytopenic purpura)    Past Surgical History:  Procedure Laterality Date  . ABDOMINAL HYSTERECTOMY  01/22/1999   ovaries resected; DUB/fibroids  . CARPAL TUNNEL RELEASE Right 09/11/2018   Procedure: CARPAL TUNNEL RELEASE;  Surgeon: Daryll Brod, MD;  Location: [facility];  Service: Orthopedics;  Laterality: Right;  . CARPAL TUNNEL RELEASE Left 10/02/2018   Procedure: CARPAL TUNNEL RELEASE;  Surgeon: Daryll Brod, MD;  Location: [facility];  Service: Orthopedics;  Laterality: Left;  FAB  . EYE SURGERY N/A    Phreesia 06/26/2019  . 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 spleen removal     Social History   Socioeconomic History  . Marital status: Widowed    Spouse name: Not on file  . Number of children: 0  . Years of education: Not on file  . Highest education level: Not on file  Occupational History  . Occupation: Retired  Tobacco Use  . Smoking status: Former Smoker    Packs/day: 0.50    Quit date: 12/04/2003    Years since quitting: 16.4  . Smokeless tobacco: Never Used  Vaping Use  . Vaping Use: Never used  Substance and Sexual Activity  . Alcohol use: No    Alcohol/week: 0.0 standard drinks  . Drug use: No  . Sexual activity: Not Currently    Birth control/protection: Surgical  Other Topics Concern  . Not on file  Social History Narrative   Marital status:  Widowed x since 2001 from [place]; married x 27 years.  Not dating really in 2019.      Children:  None; failed in vitro.      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      Tobacco: quit ; smoked 20 years.      Alcohol: none      Education: [employer].  Exercise: Yes; 3 times per week 2 hours deep water aerobics.      ADLs: independent ADLs; no assistant devices.      Advanced Directives:  None; FULL CODE; no prolonged measures.    HCPOA: Darlene Jackson/sister-in-law.     Social Determinants of Health   Financial Resource Strain: Not on file  Food Insecurity: Not on file  Transportation Needs: Not on file  Physical Activity: Not on file  Stress: Not on file  Social Connections: Not on file   Allergies  Allergen Reactions  . Lisinopril Cough   Family History  Problem Relation Age of Onset  . Cancer Mother 39       lung cancer  . Hypertension Mother   . Hyperlipidemia Mother   . Mental illness Mother   . Hypertension Sister   . Hypertension Brother   . Diabetes Brother   .  Cancer Brother 43       bladder cancer; non-smoker  . Heart disease Brother 47       CABG/CAD  . Hypertension Sister   . Diabetes Sister   . Hypertension Brother   . Diabetes Brother   . Hypertension Brother   . Diabetes Brother   . Hypertension Brother   . COPD Father      Current Outpatient Medications (Cardiovascular):  .  losartan (COZAAR) 50 MG tablet, Take 0.5 tablets (25 mg total) by mouth daily. .  rosuvastatin (CRESTOR) 10 MG tablet, 1 tab po qpm 5 days per week.  Current Outpatient Medications (Respiratory):  .  cetirizine (ZYRTEC) 10 MG tablet, Take 10 mg by mouth daily. .  fexofenadine (ALLEGRA) 180 MG tablet, TAKE 1 TABLET BY MOUTH EVERY DAY .  montelukast (SINGULAIR) 10 MG tablet, Take 1 tablet (10 mg total) by mouth daily.  Current Outpatient Medications (Analgesics):  .  allopurinol (ZYLOPRIM) 100 MG tablet, Take 1 tablet (100 mg total) by mouth 2 (two) times daily.  Current Outpatient Medications (Hematological):  .  cyanocobalamin 100 MCG tablet, Take by mouth.  Current Outpatient Medications (Other):  .  co-enzyme Q-10 30 MG capsule, Take by mouth. Javier Docker Oil 300 MG CAPS, Take by mouth. .  Multiple Vitamin (MULTIVITAMIN WITH MINERALS) TABS tablet, Take 1 tablet by mouth daily. Marland Kitchen  VITAMIN D, CHOLECALCIFEROL, PO, Take by mouth. .  vitamin E 180 MG (400 UNITS) capsule, Take 400 Units by mouth daily.   Reviewed prior external information including notes and imaging from  primary care provider As well as notes that were available from care everywhere and other healthcare systems.  Past medical history, social, surgical and family history all reviewed in electronic medical record.  No pertanent information unless stated regarding to the chief complaint.   Review of Systems:  No headache, visual changes, nausea, vomiting, diarrhea, constipation, dizziness, abdominal pain, skin rash, fevers, chills, night sweats, weight loss, swollen lymph nodes, body aches,  joint swelling, chest pain, shortness of breath, mood changes. POSITIVE muscle aches  Objective  Blood pressure (!) 142/78, pulse 91, height 5\' 5"  (1.651 m), weight 194 lb (88 kg), SpO2 94 %.   General: No apparent distress alert and oriented x3 mood and affect normal, dressed appropriately.  Overweight HEENT: Pupils equal, extraocular movements intact  Respiratory: Patient's speak in full sentences and does not appear short of breath  Cardiovascular: No lower extremity edema, non tender, no erythema  Gait mild antalgic Left knee exam shows the patient does have tenderness to palpation over the patellofemoral joint.  Patient does have very mild crepitus noted.  Lateral tracking of the knee noted.  Lacks last 10 degrees of flexion.  Bilateral hip exam shows some mild tightness with FABER test.  Patient has severe tenderness to palpation of the greater trochanteric area bilaterally.  Negative straight leg test noted.  Mild pain over the sacroiliac joint bilaterally.  Limited musculoskeletal ultrasound was performed and interpreted by Lyndal Pulley  Limited ultrasound of patient's left knee shows the patient does have a small effusion as well as synovitis of the patellofemoral joint.  Moderate to severe narrowing of the patellofemoral joint and patient also does have narrowing of the medial joint space with a chronic degenerative meniscal tear. Impression: Arthritis with small effusion and degenerative meniscal tear   Procedure: Real-time Ultrasound Guided Injection of right greater trochanteric bursitis secondary to patient's body habitus Device: GE Logiq Q7 Ultrasound guided injection is preferred based studies that show increased duration, increased effect, greater accuracy, decreased procedural pain, increased response rate, and decreased cost with ultrasound guided versus blind injection.  Verbal informed consent obtained.  Time-out conducted.  Noted no overlying erythema, induration, or  other signs of local infection.  Skin prepped in a sterile fashion.  Local anesthesia: Topical Ethyl chloride.  With sterile technique and under real time ultrasound guidance:  Greater trochanteric area was visualized and patient's bursa was noted. A 22-gauge 3 inch needle was inserted and 4 cc of 0.5% Marcaine and 1 cc of Kenalog 40 mg/dL was injected. Pictures taken Completed without difficulty  Pain immediately improved suggesting accurate placement of the medication.  Advised to call if fevers/chills, erythema, induration, drainage, or persistent bleeding.  Impression: Technically successful ultrasound guided injection.   Procedure: Real-time Ultrasound Guided Injection of left  greater trochanteric bursitis secondary to patient's body habitus Device: GE Logiq Q7  Ultrasound guided injection is preferred based studies that show increased duration, increased effect, greater accuracy, decreased procedural pain, increased response rate, and decreased cost with ultrasound guided versus blind injection.  Verbal informed consent obtained.  Time-out conducted.  Noted no overlying erythema, induration, or other signs of local infection.  Skin prepped in a sterile fashion.  Local anesthesia: Topical Ethyl chloride.  With sterile technique and under real time ultrasound guidance:  Greater trochanteric area was visualized and patient's bursa was noted. A 22-gauge 3 inch needle was inserted and 4 cc of 0.5% Marcaine and 1 cc of Kenalog 40 mg/dL was injected. Pictures taken Completed without difficulty  Pain immediately improved suggesting accurate placement of the medication.  Advised to call if fevers/chills, erythema, induration, drainage, or persistent bleeding.  Impression: Technically successful ultrasound guided injection.   Impression and Recommendations:     The above documentation has been reviewed and is accurate and complete Lyndal Pulley, DO

## 2020-05-26 ENCOUNTER — Encounter: Payer: Self-pay | Admitting: Family Medicine

## 2020-05-26 ENCOUNTER — Ambulatory Visit: Payer: Medicare HMO | Admitting: Family Medicine

## 2020-05-26 ENCOUNTER — Ambulatory Visit (INDEPENDENT_AMBULATORY_CARE_PROVIDER_SITE_OTHER): Payer: Medicare HMO

## 2020-05-26 ENCOUNTER — Ambulatory Visit: Payer: Self-pay

## 2020-05-26 ENCOUNTER — Other Ambulatory Visit: Payer: Self-pay

## 2020-05-26 VITALS — BP 142/78 | HR 91 | Ht 65.0 in | Wt 194.0 lb

## 2020-05-26 DIAGNOSIS — M7061 Trochanteric bursitis, right hip: Secondary | ICD-10-CM | POA: Insufficient documentation

## 2020-05-26 DIAGNOSIS — M47816 Spondylosis without myelopathy or radiculopathy, lumbar region: Secondary | ICD-10-CM | POA: Diagnosis not present

## 2020-05-26 DIAGNOSIS — M545 Low back pain, unspecified: Secondary | ICD-10-CM | POA: Diagnosis not present

## 2020-05-26 DIAGNOSIS — M25562 Pain in left knee: Secondary | ICD-10-CM | POA: Diagnosis not present

## 2020-05-26 DIAGNOSIS — M1A0710 Idiopathic chronic gout, right ankle and foot, without tophus (tophi): Secondary | ICD-10-CM | POA: Diagnosis not present

## 2020-05-26 DIAGNOSIS — M25552 Pain in left hip: Secondary | ICD-10-CM | POA: Diagnosis not present

## 2020-05-26 DIAGNOSIS — G8929 Other chronic pain: Secondary | ICD-10-CM

## 2020-05-26 DIAGNOSIS — M25551 Pain in right hip: Secondary | ICD-10-CM | POA: Diagnosis not present

## 2020-05-26 DIAGNOSIS — M170 Bilateral primary osteoarthritis of knee: Secondary | ICD-10-CM | POA: Diagnosis not present

## 2020-05-26 DIAGNOSIS — M1712 Unilateral primary osteoarthritis, left knee: Secondary | ICD-10-CM

## 2020-05-26 DIAGNOSIS — M7062 Trochanteric bursitis, left hip: Secondary | ICD-10-CM

## 2020-05-26 IMAGING — DX DG PELVIS 1-2V
1 series · 1 of 1 positions shown · non-contrast
Comparison: None.

CLINICAL DATA: Chronic low back pain that extends into the hips. No
known injury.

EXAM:
PELVIS - 1-2 VIEW

[pelvis ap]
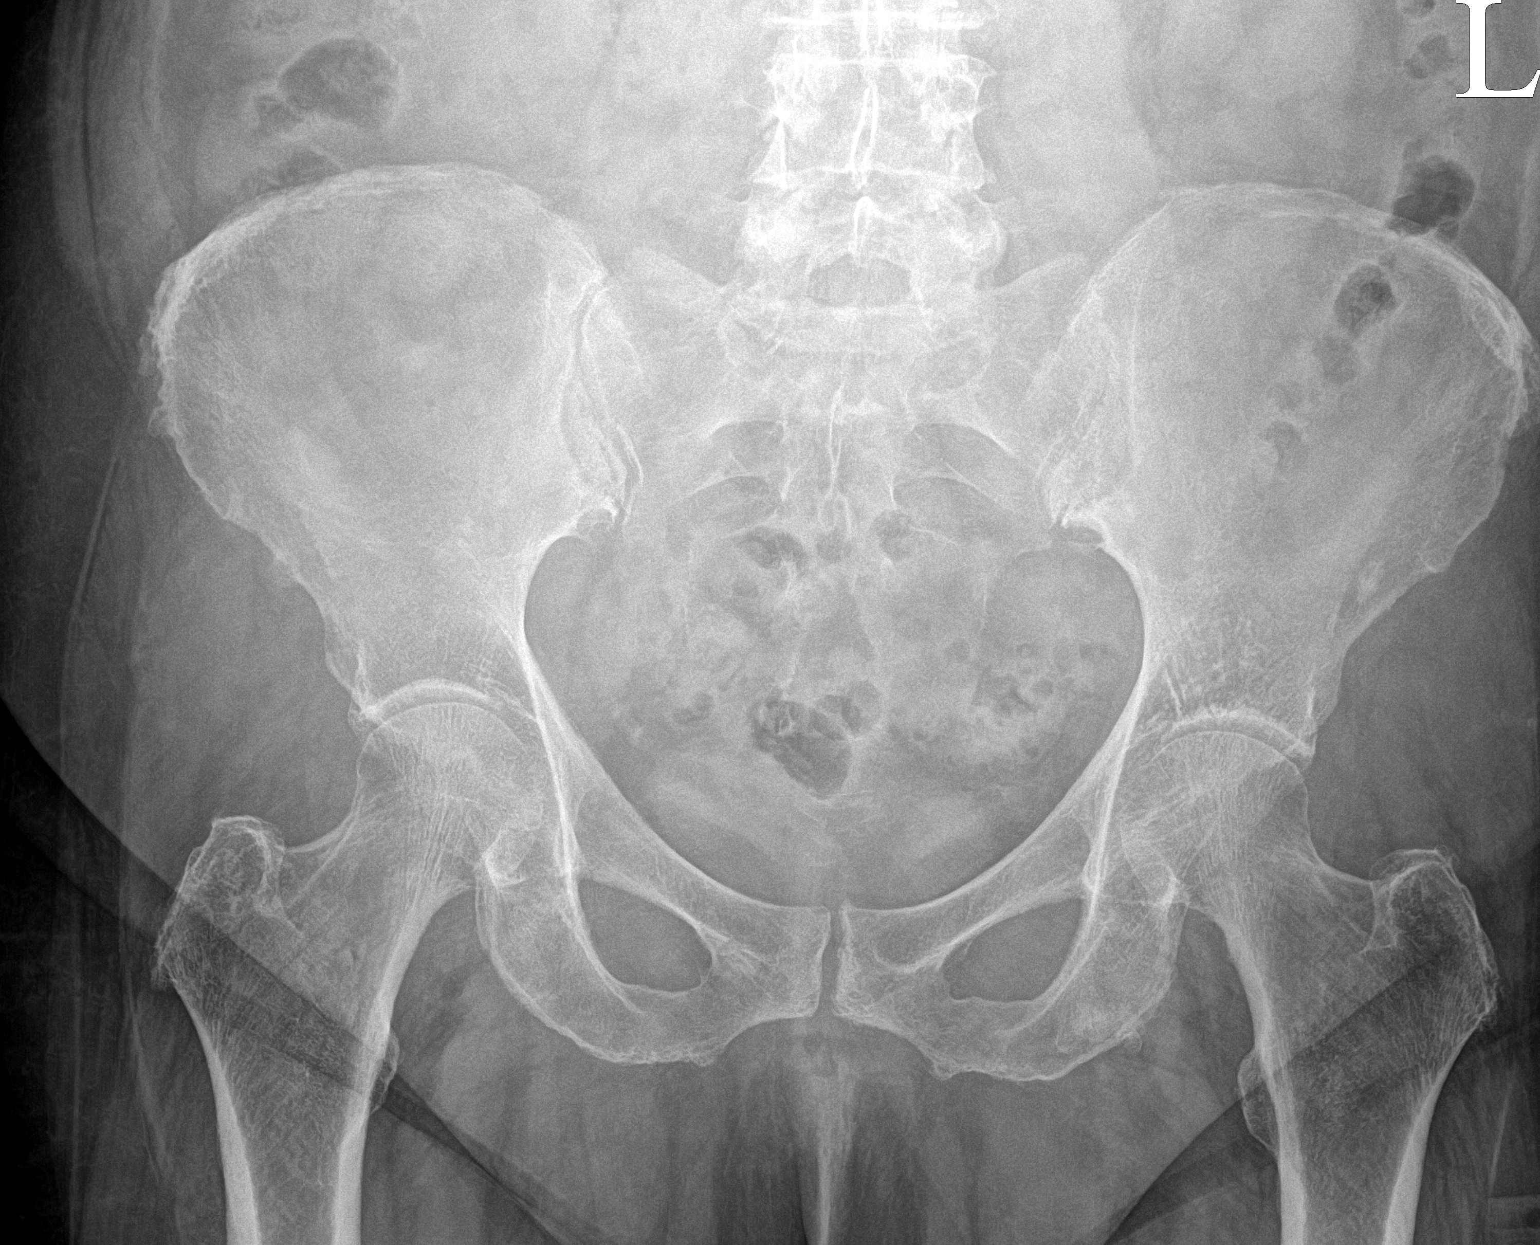

[1 of 1 positions shown; findings below may reference images not displayed]

FINDINGS: No fracture or bone lesion.

Hip joints, SI joints and symphysis pubis are normally spaced and
aligned.

Soft tissues are unremarkable.
IMPRESSION: Negative.

## 2020-05-26 IMAGING — DX DG KNEE COMPLETE 4+V*L*
4 series · 4 of 4 positions shown · non-contrast
Comparison: None.

CLINICAL DATA: Chronic generalized left knee pain.  No injury.

EXAM:
LEFT KNEE - COMPLETE 4+ VIEW

[knee ap]
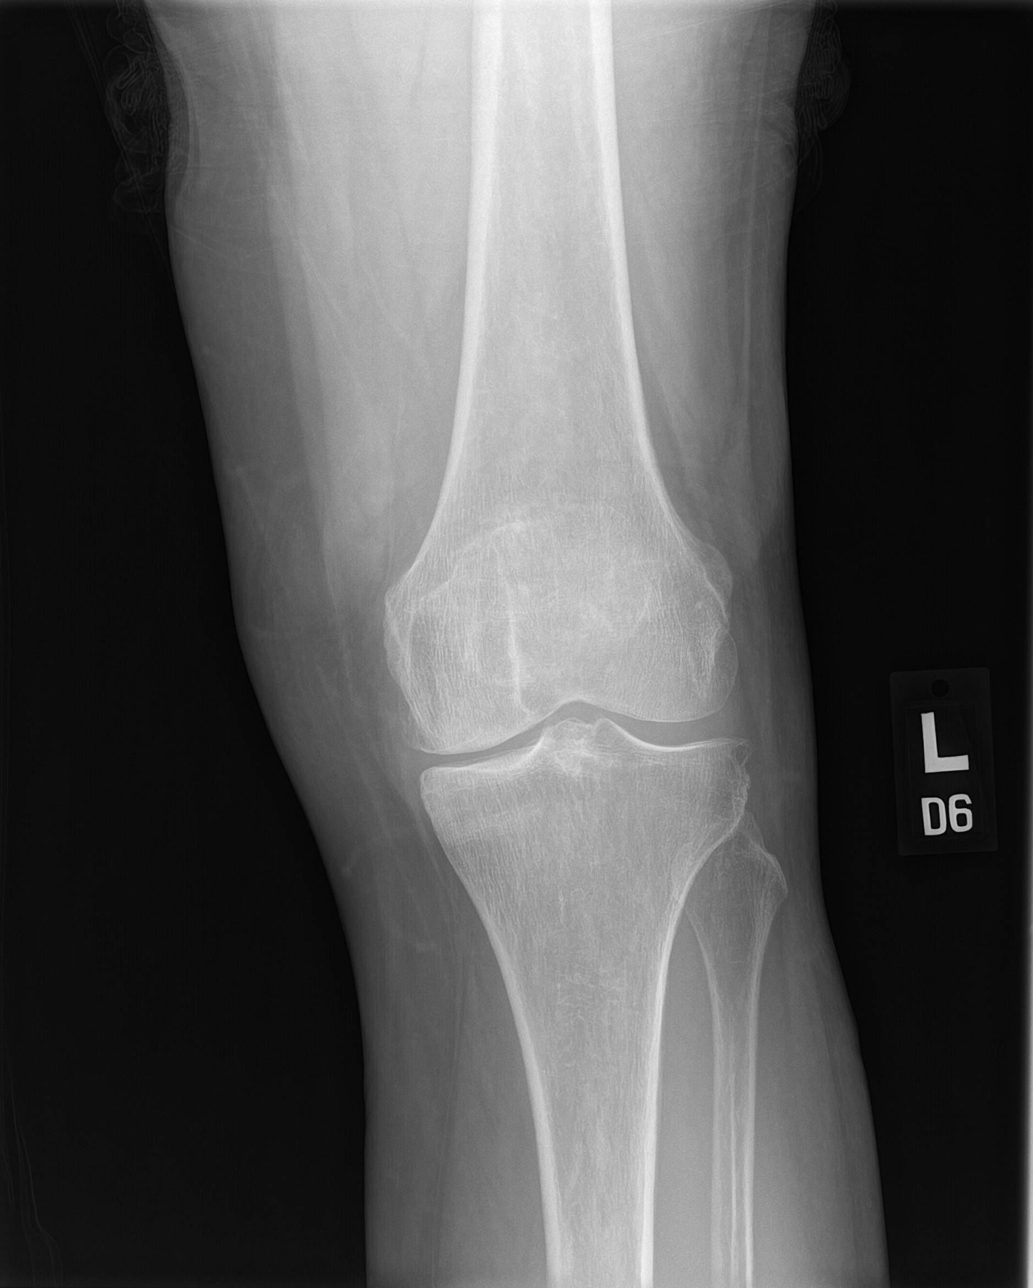

[knee obl (1 of 2)]
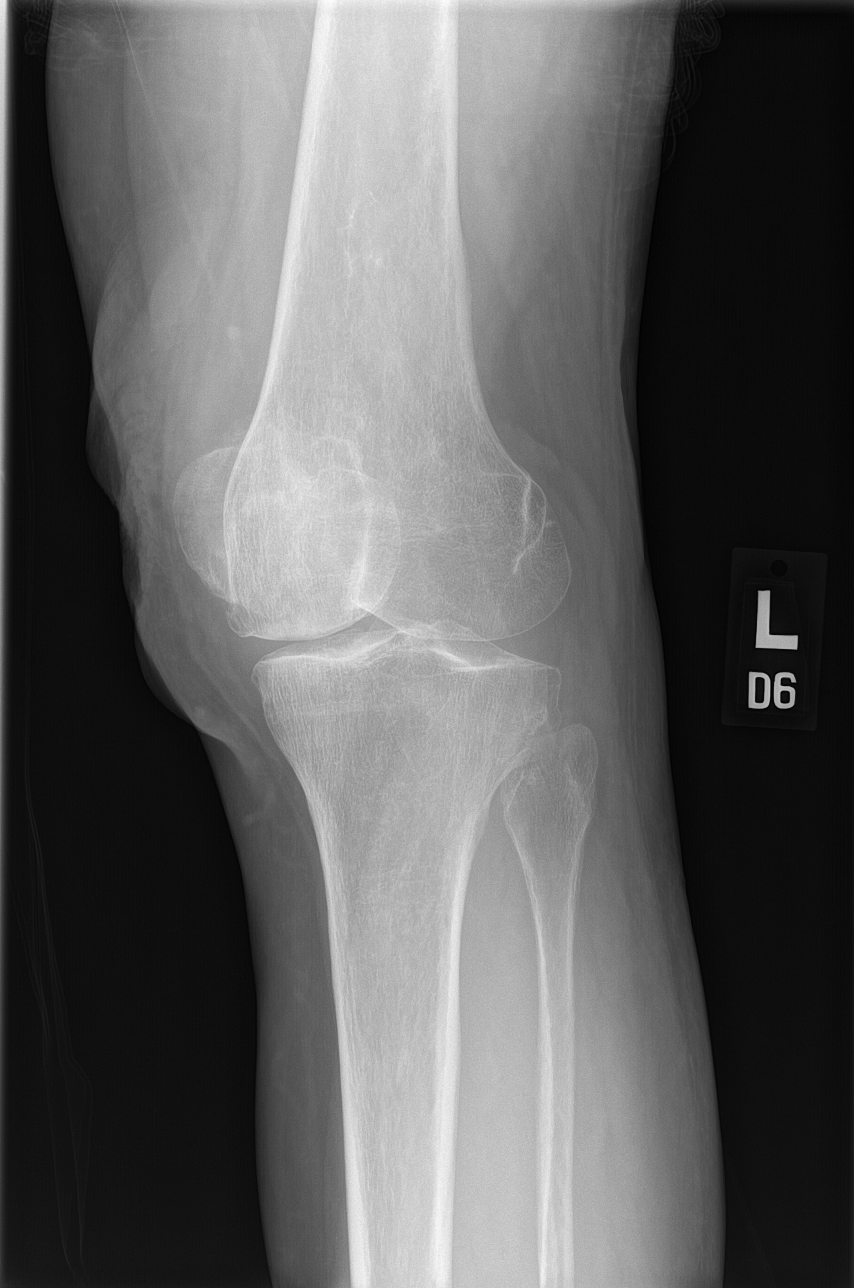

[knee obl (2 of 2)]
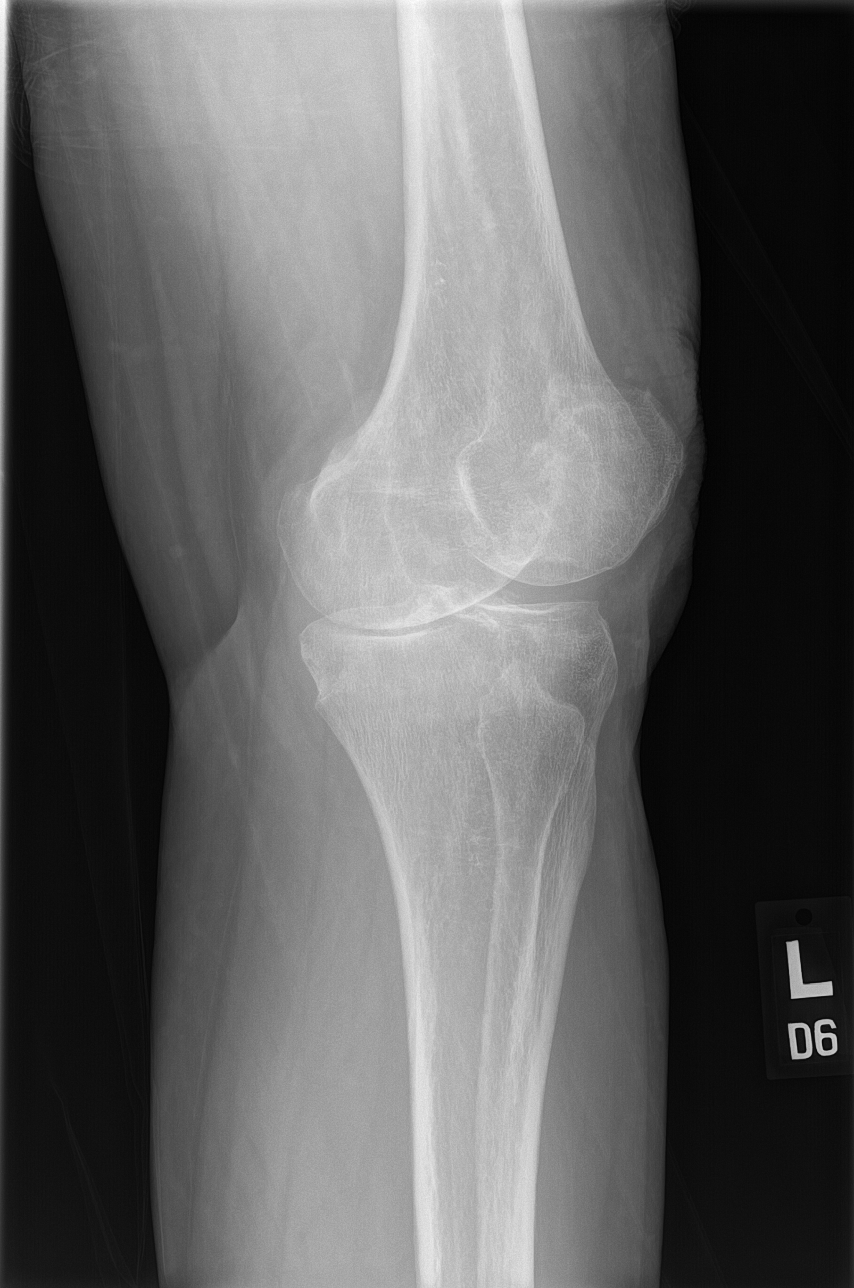

[knee lat]
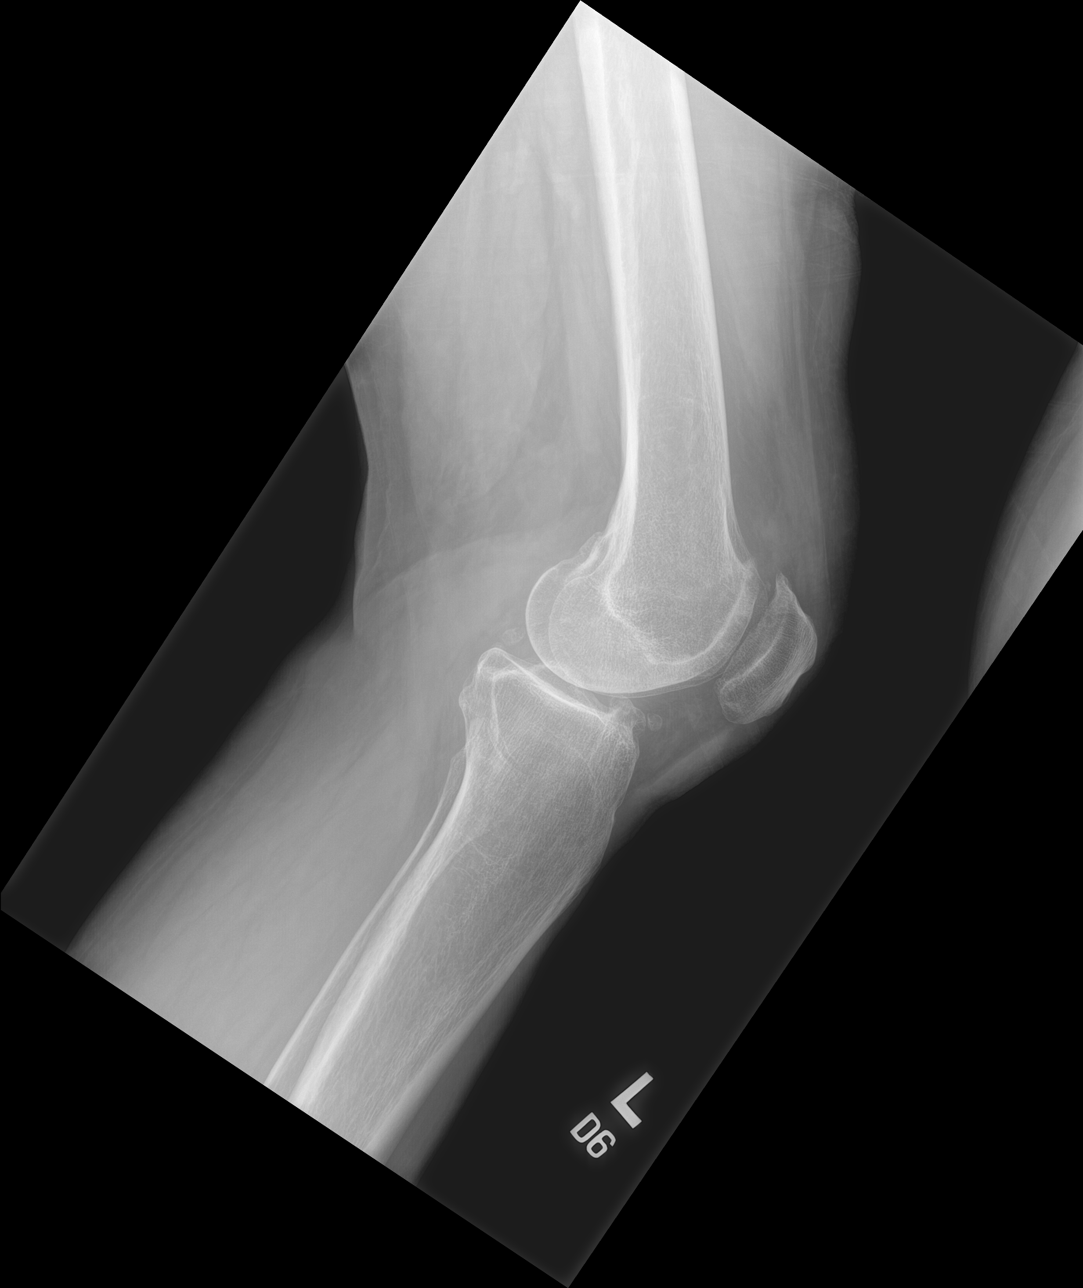

[4 of 4 positions shown; findings below may reference images not displayed]

FINDINGS: No fracture or bone lesion.

Mild narrowing of the medial joint space compartment. There small
marginal osteophytes from the medial compartment, more notably from
the patellofemoral compartment. Mild subchondral irregularity is
noted along the dorsal patella.

No joint effusion.

Surrounding soft tissues are unremarkable.
IMPRESSION: 1. No fracture or acute finding.
2. Mild degenerative/arthropathic changes involving the
patellofemoral and medial joint space compartments.

## 2020-05-26 IMAGING — DX DG LUMBAR SPINE COMPLETE 4+V
5 series · 5 of 5 positions shown · non-contrast
Comparison: None.

CLINICAL DATA: Chronic low back pain radiating into the hips. No
injury.

EXAM:
LUMBAR SPINE - COMPLETE 4+ VIEW

[l-spine ap]
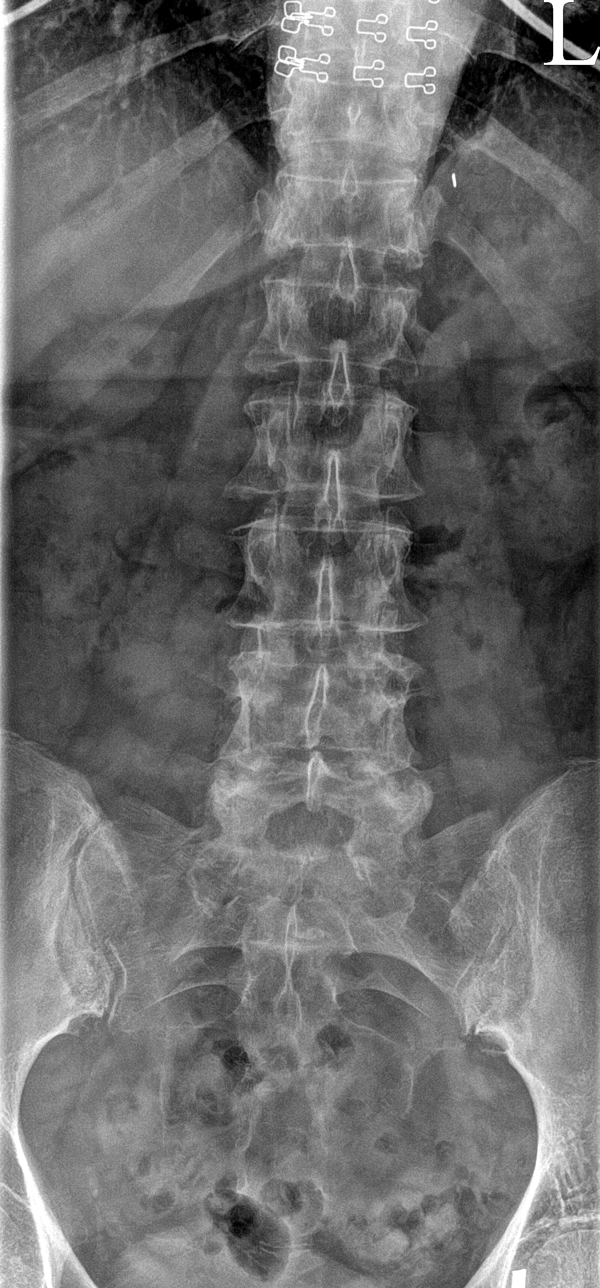

[l-spine obl (1 of 2)]
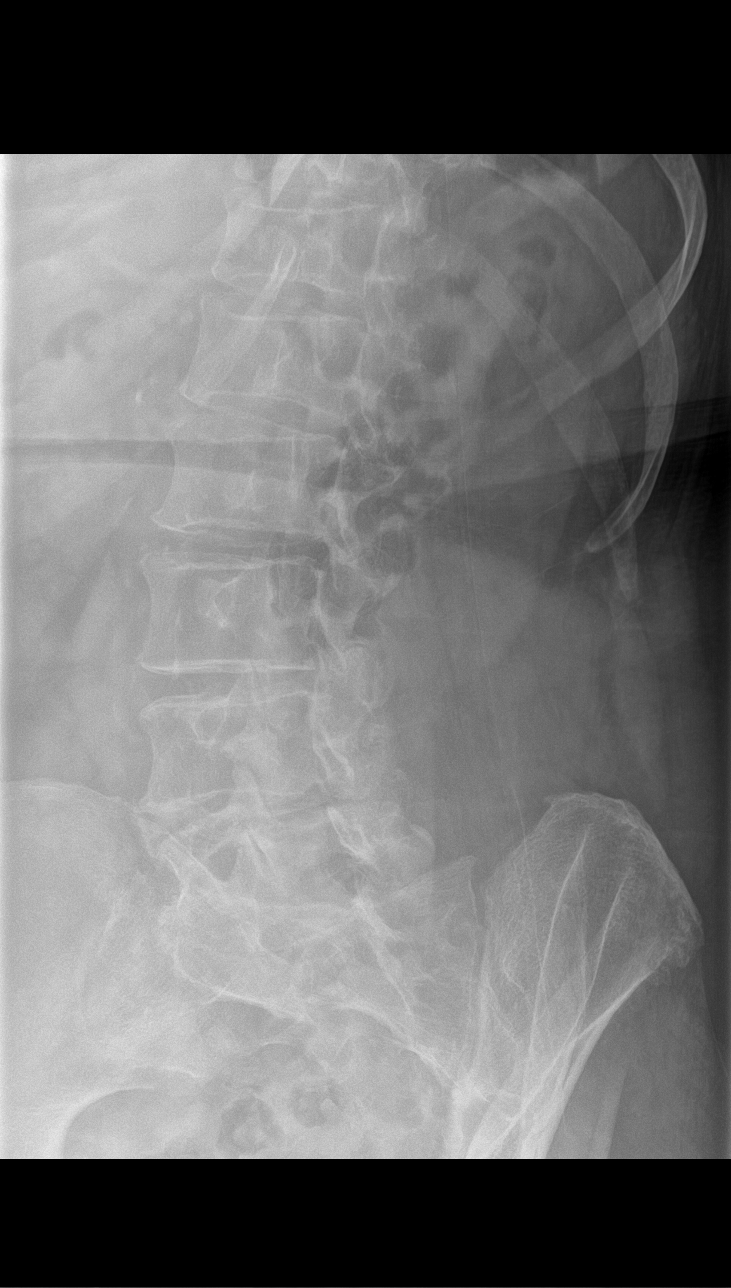

[l-spine obl (2 of 2)]
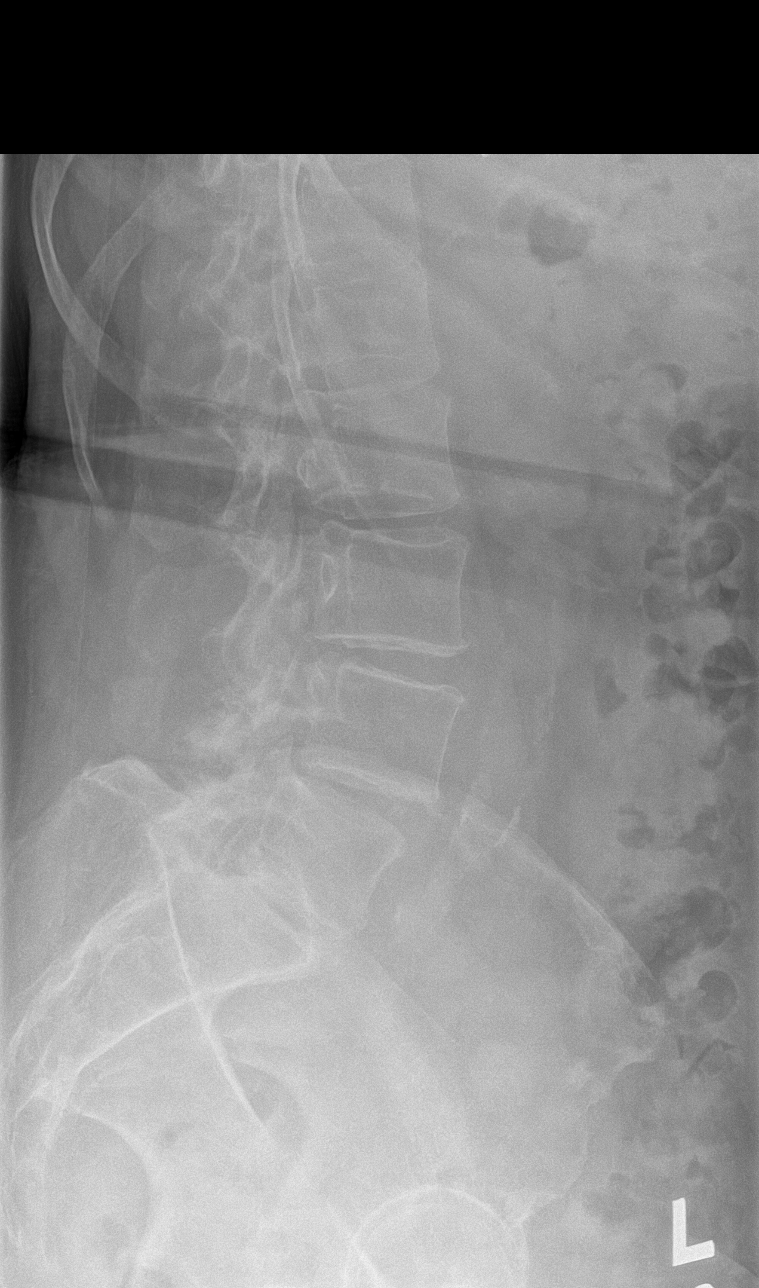

[l-spine lateral]
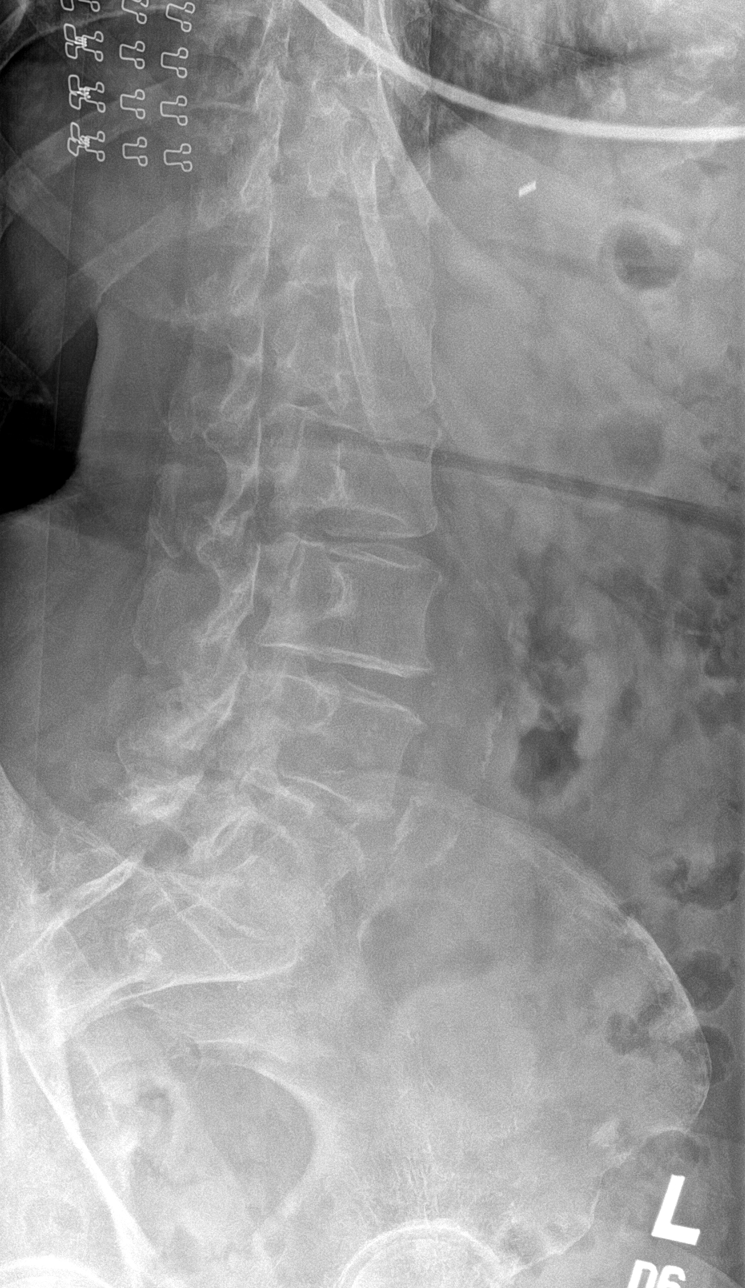

[l-spine spot]
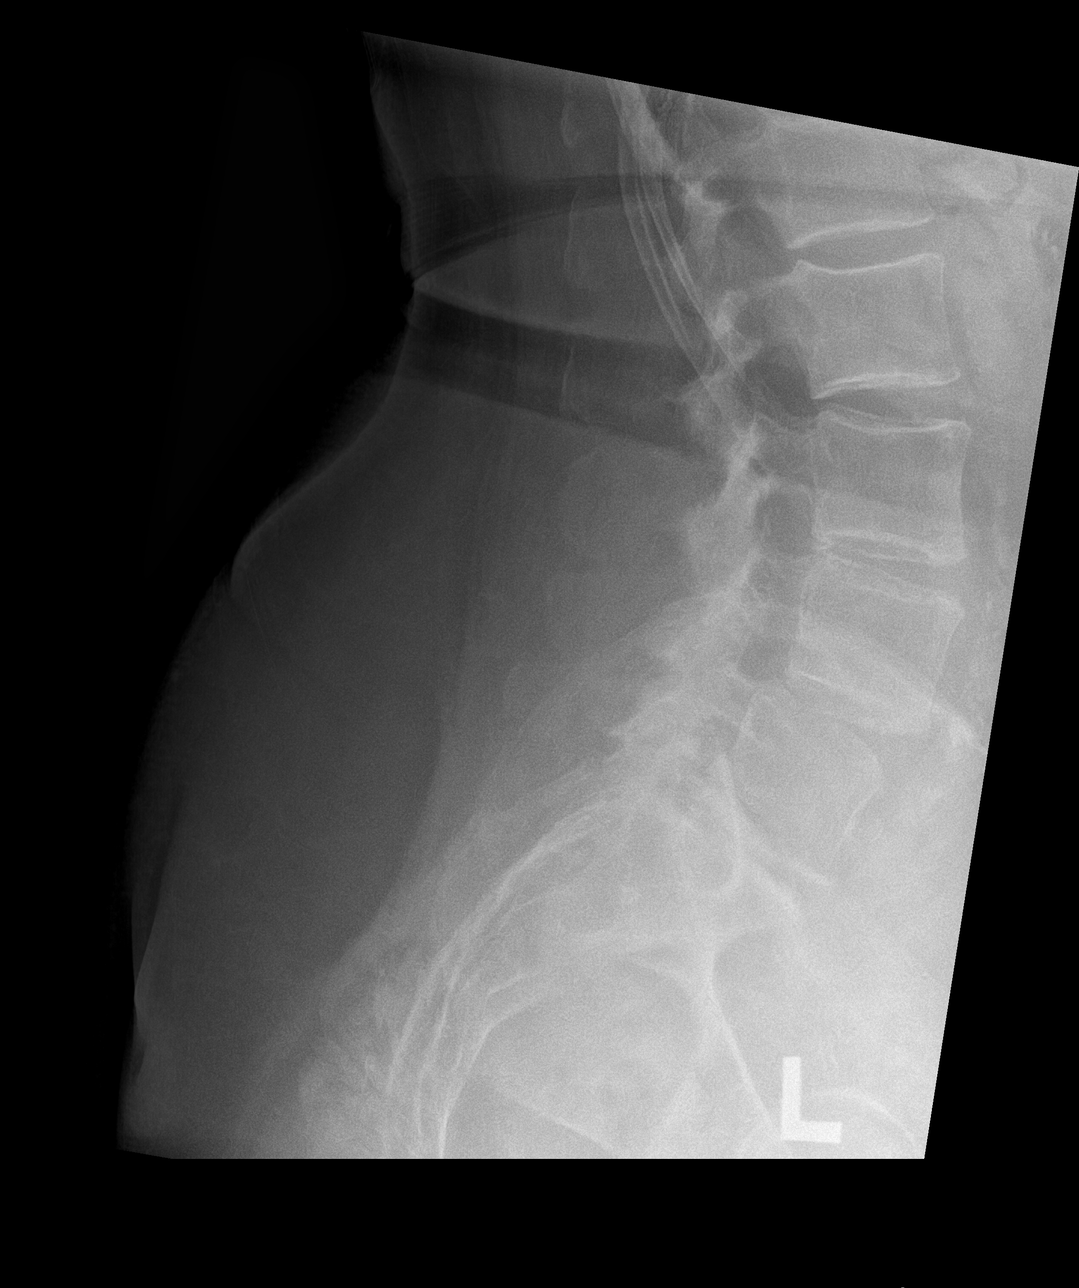

[5 of 5 positions shown; findings below may reference images not displayed]

FINDINGS: No fracture, no fracture or bone lesion.

Grade 1 anterolisthesis of L4 on L5, 3-4 mm. No other
spondylolisthesis.

Minor loss of disc height at L3-L4 with mild loss of disc height at
L4-L5 and L5-S1. There are facet degenerative changes bilaterally
from L3-L4 through L5-S1.

Scattered aortic atherosclerotic calcifications. Soft tissues are
otherwise unremarkable.
IMPRESSION: 1. No fracture or acute finding.
2. Degenerative changes as detailed.

## 2020-05-26 NOTE — Assessment & Plan Note (Signed)
Bilateral injections given today.  Tolerated the procedure well.  Discussed icing regimen and home exercises.  Patient will do topical anti-inflammatories.  We will get x-rays of the lumbar spine to rule out differential of lumbar radiculopathy.  Patient has known arthritic changes.  Also has underlying gout.  Follow-up again in 6 weeks

## 2020-05-26 NOTE — Assessment & Plan Note (Signed)
Degenerative left knee.  We will give her a Tru pull lite secondary to the patellofemoral being the main aspect.  Patient does have some degenerative tearing of the meniscus noted as well.  Patient will try conservative therapy first for follow-up again in 4 to 6 weeks.  Worsening pain will consider injection and physical therapy at follow-up

## 2020-05-26 NOTE — Patient Instructions (Addendum)
Good to see you Knee brace with activity Exercises for patellofemoral  Hip injections Vitamin D 2,000 IUs daily Tart cherry 1200 mg at night Xrays today Ice 20 mins 2 times a day See me again in 6 weeks if knee still hurts we will inject

## 2020-05-26 NOTE — Assessment & Plan Note (Signed)
Discussed over-the-counter treatment as well.

## 2020-06-02 DIAGNOSIS — H35363 Drusen (degenerative) of macula, bilateral: Secondary | ICD-10-CM | POA: Diagnosis not present

## 2020-06-02 DIAGNOSIS — H35033 Hypertensive retinopathy, bilateral: Secondary | ICD-10-CM | POA: Diagnosis not present

## 2020-06-02 DIAGNOSIS — H40013 Open angle with borderline findings, low risk, bilateral: Secondary | ICD-10-CM | POA: Diagnosis not present

## 2020-06-02 DIAGNOSIS — H04123 Dry eye syndrome of bilateral lacrimal glands: Secondary | ICD-10-CM | POA: Diagnosis not present

## 2020-06-08 ENCOUNTER — Other Ambulatory Visit: Payer: Self-pay | Admitting: Family Medicine

## 2020-06-08 DIAGNOSIS — E785 Hyperlipidemia, unspecified: Secondary | ICD-10-CM

## 2020-06-25 ENCOUNTER — Other Ambulatory Visit: Payer: Self-pay | Admitting: Family Medicine

## 2020-06-25 DIAGNOSIS — M109 Gout, unspecified: Secondary | ICD-10-CM

## 2020-06-25 DIAGNOSIS — E1129 Type 2 diabetes mellitus with other diabetic kidney complication: Secondary | ICD-10-CM

## 2020-06-28 ENCOUNTER — Encounter: Payer: Medicare HMO | Admitting: Family Medicine

## 2020-06-29 ENCOUNTER — Ambulatory Visit (INDEPENDENT_AMBULATORY_CARE_PROVIDER_SITE_OTHER): Payer: Medicare HMO | Admitting: Family Medicine

## 2020-06-29 ENCOUNTER — Encounter: Payer: Self-pay | Admitting: Family Medicine

## 2020-06-29 ENCOUNTER — Other Ambulatory Visit: Payer: Self-pay

## 2020-06-29 VITALS — BP 136/70 | HR 80 | Temp 98.0°F | Resp 16 | Ht 65.0 in | Wt 197.4 lb

## 2020-06-29 DIAGNOSIS — E785 Hyperlipidemia, unspecified: Secondary | ICD-10-CM

## 2020-06-29 DIAGNOSIS — R809 Proteinuria, unspecified: Secondary | ICD-10-CM

## 2020-06-29 DIAGNOSIS — R7989 Other specified abnormal findings of blood chemistry: Secondary | ICD-10-CM | POA: Diagnosis not present

## 2020-06-29 DIAGNOSIS — E1129 Type 2 diabetes mellitus with other diabetic kidney complication: Secondary | ICD-10-CM

## 2020-06-29 DIAGNOSIS — M109 Gout, unspecified: Secondary | ICD-10-CM | POA: Diagnosis not present

## 2020-06-29 NOTE — Patient Instructions (Signed)
Thanks for coming in today.  I do not recommend any change in medicines at this time but we can look at your cholesterol levels and if still borderline elevated can try taking an additional dose once or twice a week.  If any new side effects at that dose, let me know.  Recheck in 6 months but let me know if there are questions sooner.  Take care.

## 2020-06-29 NOTE — Progress Notes (Signed)
Subjective:  Patient ID: Sabrina Knight, female    DOB: August 02, 1950  Age: 70 y.o. MRN: [id number]  CC:  Chief Complaint  Patient presents with   Establish Care    Pt here to establish care, no concerns today, no need for refills.     HPI Sabrina Knight presents for   Establish care, prior patient of Dr. Pamella Pert.  I did see her in December and February.  Diabetes: With microalbuminuria. Diet controlled.  Last A1c overall stable in December at 6.7.  She is on a statin and ACE inhibitor in the past for microalbuminuria.  Stopped due to cough.  She is now on losartan 25 mg daily. Home readings: below 100 usually, rare 120 Microalbumin: Ratio of 30 on 06/24/2019 Optho, foot exam, pneumovax: Up-to-date  Lab Results  Component Value Date   HGBA1C 6.7 (H) 12/29/2019   HGBA1C 6.6 (H) 06/24/2019   HGBA1C 6.2 (A) 01/04/2019   Lab Results  Component Value Date   LDLCALC 120 (H) 12/29/2019   CREATININE 0.78 12/29/2019   Hyperlipidemia: Crestor initially intermittently, borderline LFTs in the past, option for increasing frequency of dosing.  Currently taking Crestor  - 4 days per week. No new side effects. No myalgias.  Lab Results  Component Value Date   CHOL 204 (H) 12/29/2019   HDL 52 12/29/2019   LDLCALC 120 (H) 12/29/2019   TRIG 180 (H) 12/29/2019   CHOLHDL 3.9 12/29/2019   Lab Results  Component Value Date   ALT 40 (H) 04/05/2020   AST 28 04/05/2020   ALKPHOS 105 04/05/2020   BILITOT 0.2 04/05/2020   Gout: Last flare:none recently Daily meds:allopurinol 100mg  BID.  Prn med: none.  Lab Results  Component Value Date   LABURIC 5.5 12/29/2019      Thrombocytosis Chronic elevated platelets in the 400-500 range with history of splenectomy. Lab Results  Component Value Date   WBC 10.2 02/23/2020   HGB 15.0 02/23/2020   HCT 44.7 02/23/2020   MCV 91 02/23/2020   PLT 485 (H) 02/23/2020    Hypertension: Losartan 52m qd.  Home readings:114/68.  BP Readings  from Last 3 Encounters:  06/29/20 136/70  05/26/20 (!) 142/78  02/23/20 130/80   Lab Results  Component Value Date   CREATININE 0.78 12/29/2019     History Patient Active Problem List   Diagnosis Date Noted   Degenerative arthritis of left knee 05/26/2020   Greater trochanteric bursitis of both hips 05/26/2020   Mild persistent asthma, uncomplicated 19/50/9326   Vasomotor rhinitis 01/08/2019   Other allergic rhinitis 01/08/2019   Bilateral carpal tunnel syndrome 09/25/2018   Dyslipidemia 10/22/2017   Statin intolerance 10/22/2017   Newly diagnosed diabetes ([place]) 10/22/2017   Gout 12/23/2016   Class 1 obesity due to excess calories with serious comorbidity and body mass index (BMI) of 31.0 to 31.9 in adult 05/19/2016   Osteopenia of necks of both femurs 05/19/2016   Thrombocytosis 02/08/2014   High cholesterol 02/01/2013   H/O splenectomy 02/01/2013   Non-allergic rhinitis 02/01/2013   Glaucoma 02/01/2013   Past Medical History:  Diagnosis Date   Allergy    Zyrtec, Singulair; allergy testing negative; Orvil Feil.   Arthritis    Asthma due to seasonal allergies    Cataract    Clotting disorder ([place])    Glaucoma    Gout    ITP (idiopathic thrombocytopenic purpura)    Past Surgical History:  Procedure Laterality Date   ABDOMINAL HYSTERECTOMY  01/22/1999  ovaries resected; DUB/fibroids   CARPAL TUNNEL RELEASE Right 09/11/2018   Procedure: CARPAL TUNNEL RELEASE;  Surgeon: Daryll Brod, MD;  Location: [facility];  Service: Orthopedics;  Laterality: Right;   CARPAL TUNNEL RELEASE Left 10/02/2018   Procedure: CARPAL TUNNEL RELEASE;  Surgeon: Daryll Brod, MD;  Location: [facility];  Service: Orthopedics;  Laterality: Left;  FAB   EYE SURGERY N/A    [employer]rees[employer] 06/26/2019   spleen removal  01/22/1995   for ITP   spleen removal     Allergies  Allergen Reactions   Lisinopril Cough   Prior to Admission medications   Medication Sig Start Date End Date  Taking? Authorizing Provider  allopurinol (ZYLOPRIM) 100 MG tablet TAKE 1 TABLET BY MOUTH TWICE A DAY 06/26/20  Yes Wendie Agreste, MD  cetirizine (ZYRTEC) 10 MG tablet Take 10 mg by mouth daily.   Yes [provider]  co-enzyme Q-10 30 MG capsule Take by mouth.   Yes [provider]  cyanocobalamin 100 MCG tablet Take by mouth.   Yes [provider]  fexofenadine (ALLEGRA) 180 MG tablet TAKE 1 TABLET BY MOUTH EVERY DAY 10/28/16  Yes [provider]  Javier Docker Oil 300 MG CAPS Take by mouth.   Yes [provider]  losartan (COZAAR) 50 MG tablet TAKE 1/2 TABLET BY MOUTH DAILY 06/26/20  Yes Wendie Agreste, MD  montelukast (SINGULAIR) 10 MG tablet Take 1 tablet (10 mg total) by mouth daily. 02/23/20  Yes Wendie Agreste, MD  Multiple Vitamin (MULTIVITAMIN WITH MINERALS) TABS tablet Take 1 tablet by mouth daily.   Yes [provider]  rosuvastatin (CRESTOR) 10 MG tablet TAKE 1 TAB BY MOUTH DAILY IN THE EVENING 5 DAYS PER WEEK. 06/08/20  Yes Wendie Agreste, MD  VITAMIN D, CHOLECALCIFEROL, PO Take by mouth.   Yes [provider]  vitamin E 180 MG (400 UNITS) capsule Take 400 Units by mouth daily.   Yes [provider]   Social History   Socioeconomic History   Marital status: Widowed    Spouse name: Not on file   Number of children: 0   Years of education: Not on file   Highest education level: Not on file  Occupational History   Occupation: Retired  Tobacco Use   Smoking status: Former    Packs/day: 0.50    Pack years: 0.00    Types: Cigarettes    Quit date: 12/04/2003    Years since quitting: 16.5   Smokeless tobacco: Never  Vaping Use   Vaping Use: Never used  Substance and Sexual Activity   Alcohol use: No    Alcohol/week: 0.0 standard drinks   Drug use: No   Sexual activity: Not Currently    Birth control/protection: Surgical  Other Topics Concern   Not on file  Social History Narrative   Marital status:   Widowed x since 2001 from [place]; married x 27 years.  Not dating really in 2019.      Children:  None; failed in vitro.      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      Tobacco: quit ; smoked 20 years.      Alcohol: none      Education: [employer].       Exercise: Yes; 3 times per week 2 hours deep water aerobics.      ADLs: independent ADLs; no assistant devices.  Advanced Directives:  None; FULL CODE; no prolonged measures.    HCPOA: Darlene Jackson/sister-in-law.     Social Determinants of Health   Financial Resource Strain: Not on file  Food Insecurity: Not on file  Transportation Needs: Not on file  Physical Activity: Not on file  Stress: Not on file  Social Connections: Not on file  Intimate Partner Violence: Not on file    Review of Systems   Objective:   Vitals:   06/29/20 1520  BP: 136/70  Pulse: 80  Resp: 16  Temp: 98 F (36.7 C)  TempSrc: Temporal  SpO2: 95%  Weight: 197 lb 6.4 oz (89.5 kg)  Height: 5\' 5"  (1.651 m)     Physical Exam Vitals reviewed.  Constitutional:      Appearance: Normal appearance. She is well-developed.     Comments: overweight  HENT:     Head: Normocephalic and atraumatic.  Eyes:     Conjunctiva/sclera: Conjunctivae normal.     Pupils: Pupils are equal, round, and reactive to light.  Neck:     Vascular: No carotid bruit.  Cardiovascular:     Rate and Rhythm: Normal rate and regular rhythm.     Heart sounds: Normal heart sounds.  Pulmonary:     Effort: Pulmonary effort is normal.     Breath sounds: Normal breath sounds.  Abdominal:     Palpations: Abdomen is soft. There is no pulsatile mass.     Tenderness: There is no abdominal tenderness.  Musculoskeletal:     Right lower leg: No edema.     Left lower leg: No edema.  Skin:    General: Skin is warm and dry.  Neurological:     Mental Status: She is alert and oriented to person, place, and time.  Psychiatric:         Mood and Affect: Mood normal.        Behavior: Behavior normal.    31 minutes spent during visit, including chart review, counseling and assimilation of information, exam, discussion of plan including crestor, and chart completion.   Assessment & Plan:  Sabrina Knight is a 70 y.o. female . Hyperlipidemia, unspecified hyperlipidemia type - Plan: Comprehensive metabolic panel, Lipid panel  -Option of additional dosing of statin during the week.  Check labs.  Continue Crestor up to daily dosing with ongoing monitoring of LFTs.  Borderline previously.  May be component of hepatic steatosis.  Asymptomatic.  Gout, unspecified cause, unspecified chronicity, unspecified site  -Stable with allopurinol, continue same.  Type 2 diabetes mellitus with microalbuminuria, without long-term current use of insulin (HCC) - Plan: Hemoglobin A1c  -Overall stable A1c previously, repeat testing.  On ARB for microalbuminuria.  Elevated LFTs - Plan: Comprehensive metabolic panel  -As above, possible hepatic steatosis component.  Check CMP.  Consider ultrasound if further elevations.  No orders of the defined types were placed in this encounter.  Patient Instructions  Thanks for coming in today.  I do not recommend any change in medicines at this time but we can look at your cholesterol levels and if still borderline elevated can try taking an additional dose once or twice a week.  If any new side effects at that dose, let me know.  Recheck in 6 months but let me know if there are questions sooner.  Take care.    Signed, Merri Ray, MD [facility]

## 2020-06-30 LAB — LIPID PANEL
Cholesterol: 180 mg/dL (ref 0–200)
HDL: 54.2 mg/dL (ref >39.00)
NonHDL: 125.37
Total CHOL/HDL Ratio: 3
Triglycerides: 207 mg/dL — ABNORMAL HIGH (ref 0.0–149.0)
VLDL: 41.4 mg/dL — ABNORMAL HIGH (ref 0.0–40.0)

## 2020-06-30 LAB — COMPREHENSIVE METABOLIC PANEL
ALT: 24 U/L (ref 0–35)
AST: 16 U/L (ref 0–37)
Albumin: 4.3 g/dL (ref 3.5–5.2)
Alkaline Phosphatase: 77 U/L (ref 39–117)
BUN: 21 mg/dL (ref 6–23)
CO2: 26 mEq/L (ref 19–32)
Calcium: 9.5 mg/dL (ref 8.4–10.5)
Chloride: 106 mEq/L (ref 96–112)
Creatinine, Ser: 0.85 mg/dL (ref 0.40–1.20)
GFR: 69.12 mL/min (ref >60.00)
Glucose, Bld: 82 mg/dL (ref 70–99)
Potassium: 4.3 mEq/L (ref 3.5–5.1)
Sodium: 142 mEq/L (ref 135–145)
Total Bilirubin: 0.3 mg/dL (ref 0.2–1.2)
Total Protein: 7.5 g/dL (ref 6.0–8.3)

## 2020-06-30 LAB — LDL CHOLESTEROL, DIRECT: Direct LDL: 93 mg/dL

## 2020-06-30 LAB — HEMOGLOBIN A1C: Hgb A1c MFr Bld: 7 % — ABNORMAL HIGH (ref 4.6–6.5)

## 2020-07-11 ENCOUNTER — Ambulatory Visit: Payer: Medicare HMO | Admitting: Family Medicine

## 2020-07-12 ENCOUNTER — Encounter: Payer: Self-pay | Admitting: Family Medicine

## 2020-07-12 ENCOUNTER — Ambulatory Visit: Payer: Medicare HMO | Admitting: Family Medicine

## 2020-07-12 ENCOUNTER — Other Ambulatory Visit: Payer: Self-pay

## 2020-07-12 VITALS — BP 116/72 | HR 92 | Ht 65.0 in | Wt 196.0 lb

## 2020-07-12 DIAGNOSIS — M545 Low back pain, unspecified: Secondary | ICD-10-CM | POA: Diagnosis not present

## 2020-07-12 DIAGNOSIS — M5136 Other intervertebral disc degeneration, lumbar region: Secondary | ICD-10-CM

## 2020-07-12 DIAGNOSIS — M25551 Pain in right hip: Secondary | ICD-10-CM

## 2020-07-12 DIAGNOSIS — M1712 Unilateral primary osteoarthritis, left knee: Secondary | ICD-10-CM

## 2020-07-12 DIAGNOSIS — M7061 Trochanteric bursitis, right hip: Secondary | ICD-10-CM

## 2020-07-12 DIAGNOSIS — M7062 Trochanteric bursitis, left hip: Secondary | ICD-10-CM | POA: Diagnosis not present

## 2020-07-12 DIAGNOSIS — M25552 Pain in left hip: Secondary | ICD-10-CM

## 2020-07-12 NOTE — Assessment & Plan Note (Signed)
Patient did not respond to the injection.  Concern for potential lumbar radiculopathy.  Still affecting daily activities even when she goes to the store as well as waking up at night.  Patient states if anything may be potentially worsening.  Do feel that this could be more of a lumbar pathology with patient's x-rays noted.  Also will get a pelvis MRI at the same time to further evaluate for any type of muscle injury that could be also contributing or the potential for intra-articular hip pathology.  Patient will increase activity slowly and follow-up with me again after imaging to discuss further.

## 2020-07-12 NOTE — Progress Notes (Signed)
[facility] Phone: [phone] Subjective:   Sabrina Knight, am serving as a scribe for Dr. Hulan Saas.  This visit occurred during the SARS-CoV-2 public health emergency.  Safety protocols were in place, including screening questions prior to the visit, additional usage of staff PPE, and extensive cleaning of exam room while observing appropriate contact time as indicated for disinfecting solutions.    I'm seeing this patient by the request  of:  Jacelyn Pi, Irma M, MD  CC: Low back and hip pain follow-up  EHM:[id number]  05/26/2020 Bilateral injections given today.  Tolerated the procedure well.  Discussed icing regimen and home exercises.  Patient will do topical anti-inflammatories.  We will get x-rays of the lumbar spine to rule out differential of lumbar radiculopathy.  Patient has known arthritic changes.  Also has underlying gout.  Follow-up again in 6 weeks  Degenerative left knee.  We will give her a Tru pull lite secondary to the patellofemoral being the main aspect.  Patient does have some degenerative tearing of the meniscus noted as well.  Patient will try conservative therapy first for follow-up again in 4 to 6 weeks.  Worsening pain will consider injection and physical therapy at follow-up  Update 07/12/2020 Sabrina Knight is a 70 y.o. female coming in with complaint of B hip and L knee pain. Patient states that her knee pain has been much less since last visit.   Hip pain persists R>L. Did go for a walk 4 days after getting the injections and pain increased. Painful not so sleep.   Xray Lumbar 05/26/2020 IMPRESSION: 1. Knight fracture or acute finding. 2. Degenerative changes as detailed.  Xray L knee 05/26/2020 IMPRESSION: 1. Knight fracture or acute finding. 2. Mild degenerative/arthropathic changes involving the patellofemoral and medial joint space compartments.    Xray pelvis  05/26/2020 IMPRESSION: Negative.       Past Medical History:  Diagnosis Date   Allergy    Zyrtec, Singulair; allergy testing negative; Orvil Feil.   Arthritis    Asthma due to seasonal allergies    Cataract    Clotting disorder ([place])    Glaucoma    Gout    ITP (idiopathic thrombocytopenic purpura)    Past Surgical History:  Procedure Laterality Date   ABDOMINAL HYSTERECTOMY  01/22/1999   ovaries resected; DUB/fibroids   CARPAL TUNNEL RELEASE Right 09/11/2018   Procedure: CARPAL TUNNEL RELEASE;  Surgeon: Daryll Brod, MD;  Location: [facility];  Service: Orthopedics;  Laterality: Right;   CARPAL TUNNEL RELEASE Left 10/02/2018   Procedure: CARPAL TUNNEL RELEASE;  Surgeon: Daryll Brod, MD;  Location: [facility];  Service: Orthopedics;  Laterality: Left;  FAB   EYE SURGERY N/A    Phreesia 06/26/2019   spleen removal  01/22/1995   for ITP   spleen removal     Social History   Socioeconomic History   Marital status: Widowed    Spouse name: Not on file   Number of children: 0   Years of education: Not on file   Highest education level: Not on file  Occupational History   Occupation: Retired  Tobacco Use   Smoking status: Former    Packs/day: 0.50    Pack years: 0.00    Types: Cigarettes    Quit date: 12/04/2003    Years since quitting: 16.6   Smokeless tobacco: Never  Vaping Use   Vaping Use: Never used  Substance  and Sexual Activity   Alcohol use: Knight    Alcohol/week: 0.0 standard drinks   Drug use: Knight   Sexual activity: Not Currently    Birth control/protection: Surgical  Other Topics Concern   Not on file  Social History Narrative   Marital status:  Widowed x since 2001 from [place]; married x 27 years.  Not dating really in 2019.      Children:  None; failed in vitro.      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      Tobacco: quit ; smoked 20 years.      Alcohol: none      Education: [employer].       Exercise: Yes; 3 times per week 2 hours deep water aerobics.      ADLs: independent ADLs; Knight assistant devices.      Advanced Directives:  None; FULL CODE; Knight prolonged measures.    HCPOA: Darlene Jackson/sister-in-law.     Social Determinants of Health   Financial Resource Strain: Not on file  Food Insecurity: Not on file  Transportation Needs: Not on file  Physical Activity: Not on file  Stress: Not on file  Social Connections: Not on file   Allergies  Allergen Reactions   Lisinopril Cough   Family History  Problem Relation Age of Onset   Cancer Mother 55       lung cancer   Hypertension Mother    Hyperlipidemia Mother    Mental illness Mother    Hypertension Sister    Hypertension Brother    Diabetes Brother    Cancer Brother 73       bladder cancer; non-smoker   Heart disease Brother 17       CABG/CAD   Hypertension Sister    Diabetes Sister    Hypertension Brother    Diabetes Brother    Hypertension Brother    Diabetes Brother    Hypertension Brother    COPD Father      Current Outpatient Medications (Cardiovascular):    losartan (COZAAR) 50 MG tablet, TAKE 1/2 TABLET BY MOUTH DAILY   rosuvastatin (CRESTOR) 10 MG tablet, TAKE 1 TAB BY MOUTH DAILY IN THE EVENING 5 DAYS PER WEEK.  Current Outpatient Medications (Respiratory):    cetirizine (ZYRTEC) 10 MG tablet, Take 10 mg by mouth daily.   fexofenadine (ALLEGRA) 180 MG tablet, TAKE 1 TABLET BY MOUTH EVERY DAY   montelukast (SINGULAIR) 10 MG tablet, Take 1 tablet (10 mg total) by mouth daily.  Current Outpatient Medications (Analgesics):    allopurinol (ZYLOPRIM) 100 MG tablet, TAKE 1 TABLET BY MOUTH TWICE A DAY  Current Outpatient Medications (Hematological):    cyanocobalamin 100 MCG tablet, Take by mouth.  Current Outpatient Medications (Other):    co-enzyme Q-10 30 MG capsule, Take by mouth.   Krill Oil 300 MG CAPS, Take by mouth.   Multiple Vitamin (MULTIVITAMIN WITH MINERALS) TABS  tablet, Take 1 tablet by mouth daily.   VITAMIN D, CHOLECALCIFEROL, PO, Take by mouth.   vitamin E 180 MG (400 UNITS) capsule, Take 400 Units by mouth daily.   Reviewed prior external information including notes and imaging from  primary care provider As well as notes that were available from care everywhere and other healthcare systems.  Past medical history, social, surgical and family history all reviewed in electronic medical record.  Knight pertanent information unless stated regarding to the chief complaint.   Review  of Systems:  Knight headache, visual changes, nausea, vomiting, diarrhea, constipation, dizziness, abdominal pain, skin rash, fevers, chills, night sweats, weight loss, swollen lymph nodes, body aches, joint swelling, chest pain, shortness of breath, mood changes. POSITIVE muscle aches  Objective  Blood pressure 116/72, pulse 92, height 5\' 5"  (1.651 m), weight 196 lb (88.9 kg), SpO2 93 %.   General: Knight apparent distress alert and oriented x3 mood and affect normal, dressed appropriately.  HEENT: Pupils equal, extraocular movements intact  Respiratory: Patient's speak in full sentences and does not appear short of breath  Cardiovascular: Knight lower extremity edema, non tender, Knight erythema  Gait n antalgic gait MSK: Patient does have tightness with straight leg test bilaterally.  Knight difficulty with FABER test as well.  Patient does not does have tenderness to palpation of the paraspinal musculature of the lumbar spine.  Questionable weakness of the legs with 4 out of 5 strength which is different than previously.  Patient continues to have pain all over.  Greater trochanteric area as well still. Left knee exam still has some mild tenderness to palpation but good range of motion.  Crepitus noted.   Impression and Recommendations:     The above documentation has been reviewed and is accurate and complete Lyndal Pulley, DO

## 2020-07-12 NOTE — Patient Instructions (Addendum)
MRI lumbar and pelvis Will call you with results

## 2020-07-12 NOTE — Assessment & Plan Note (Signed)
Patient is feeling better  Patient is very happy at this moment.  Patient will increase activity wants to hold on any type of injection.

## 2020-07-23 ENCOUNTER — Other Ambulatory Visit: Payer: Self-pay

## 2020-07-23 ENCOUNTER — Ambulatory Visit
Admission: RE | Admit: 2020-07-23 | Discharge: 2020-07-23 | Disposition: A | Discharge status: Home / Self Care | Payer: Medicare HMO | Source: Ambulatory Visit | Attending: Family Medicine | Admitting: Family Medicine

## 2020-07-23 DIAGNOSIS — S76312A Strain of muscle, fascia and tendon of the posterior muscle group at thigh level, left thigh, initial encounter: Secondary | ICD-10-CM | POA: Diagnosis not present

## 2020-07-23 DIAGNOSIS — M25551 Pain in right hip: Secondary | ICD-10-CM

## 2020-07-23 DIAGNOSIS — M160 Bilateral primary osteoarthritis of hip: Secondary | ICD-10-CM | POA: Diagnosis not present

## 2020-07-23 DIAGNOSIS — K5730 Diverticulosis of large intestine without perforation or abscess without bleeding: Secondary | ICD-10-CM | POA: Diagnosis not present

## 2020-07-23 DIAGNOSIS — M48061 Spinal stenosis, lumbar region without neurogenic claudication: Secondary | ICD-10-CM | POA: Diagnosis not present

## 2020-07-23 DIAGNOSIS — M545 Low back pain, unspecified: Secondary | ICD-10-CM

## 2020-07-23 DIAGNOSIS — M47816 Spondylosis without myelopathy or radiculopathy, lumbar region: Secondary | ICD-10-CM | POA: Diagnosis not present

## 2020-07-23 DIAGNOSIS — M25552 Pain in left hip: Secondary | ICD-10-CM

## 2020-07-23 IMAGING — MR MR PELVIS W/O CM
4 of 5 series · 31 of 48 positions shown · non-contrast
Comparison: None.

CLINICAL DATA: Pelvis pain, stress fracture suspected, neg xray B
hip pain

EXAM:
MRI PELVIS WITHOUT CONTRAST
TECHNIQUE: Multiplanar multisequence MR imaging of the pelvis was performed. No
intravenous contrast was administered.

[Series 3: T2 fat-sat · sagittal · 4.0mm · 0.51mm/px · 8 of 70 slices shown]
[im 1/70]
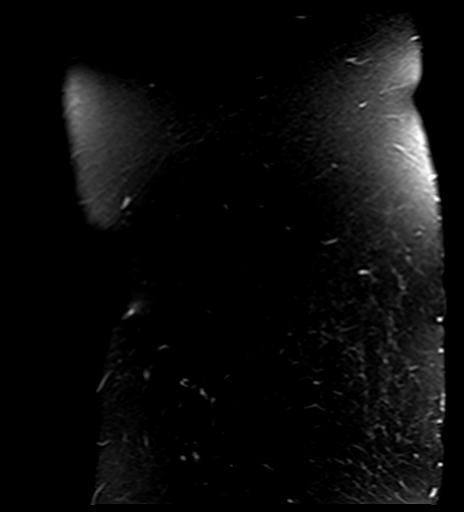
[im 11/70]
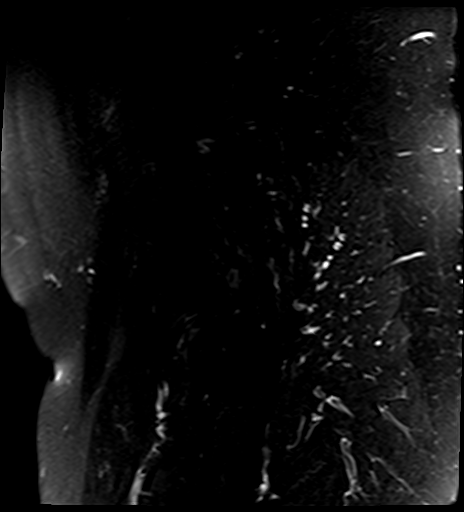
[im 22/70]
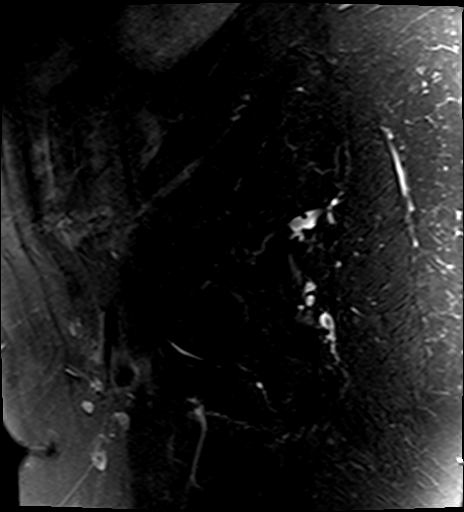
[im 32/70]
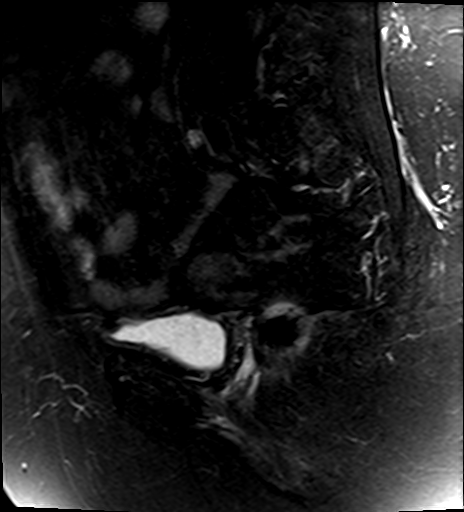
[im 38/70]
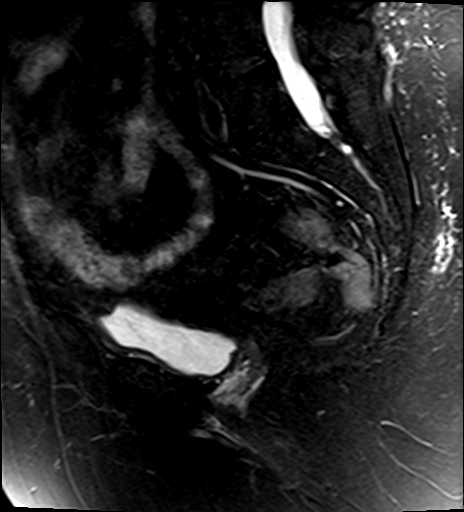
[im 48/70]
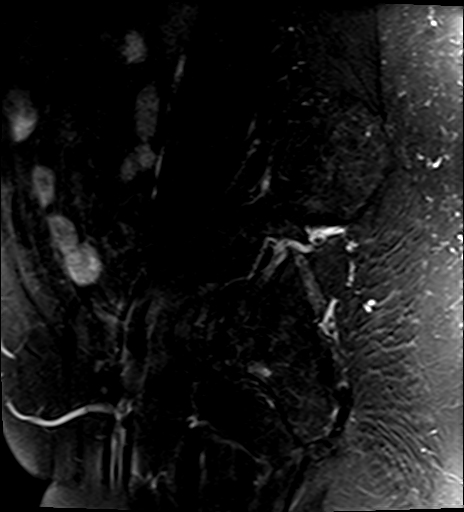
[im 59/70]
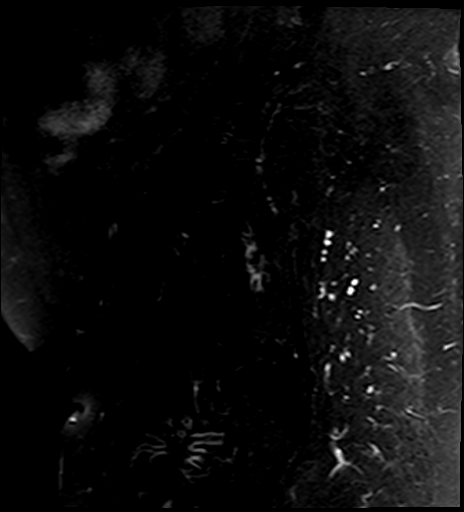
[im 70/70]
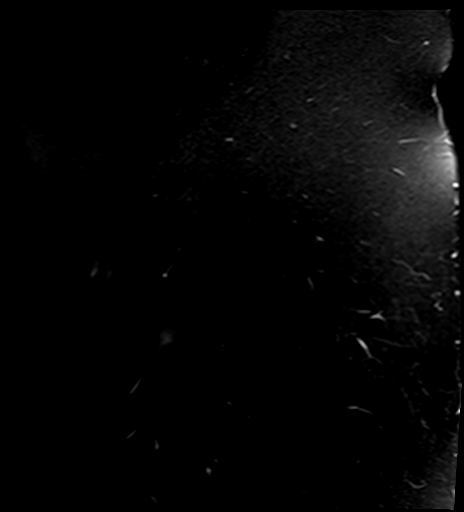

[Series 4: T1 · coronal · 4.0mm · 1.56mm/px · 8 of 45 slices shown (1 of 2)]
[im 1/45]
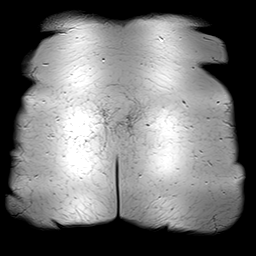
[im 7/45]
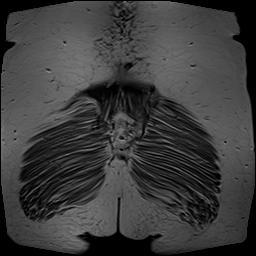
[im 13/45]
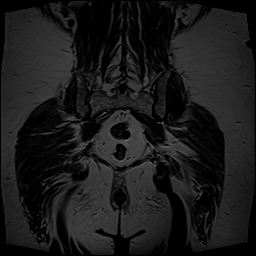
[im 19/45]
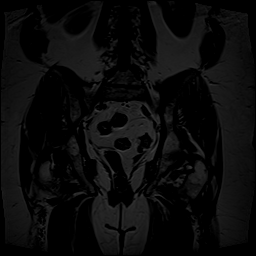
[im 26/45]
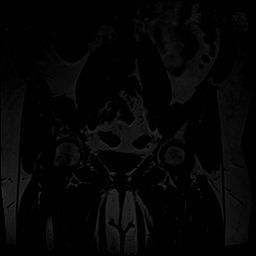
[im 32/45]
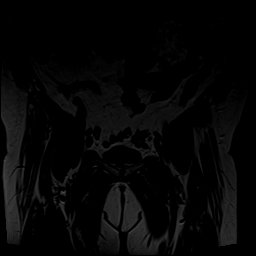
[im 38/45]
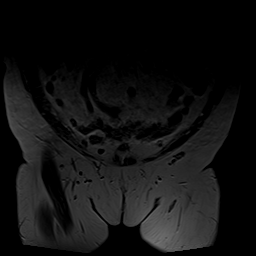
[im 45/45]
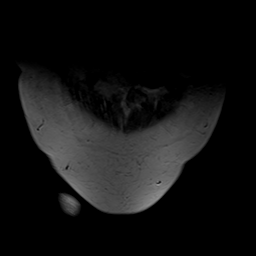

[Series 5: STIR · coronal · 4.0mm · 1.56mm/px · 8 of 45 slices shown]
[im 1/45]
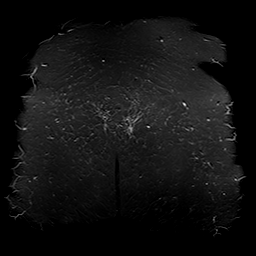
[im 7/45]
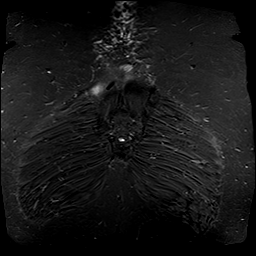
[im 13/45]
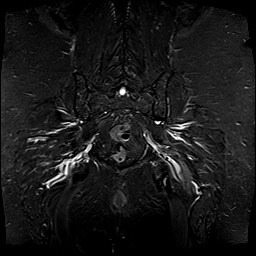
[im 19/45]
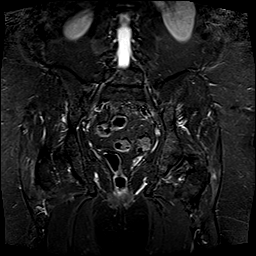
[im 26/45]
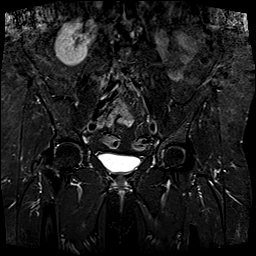
[im 32/45]
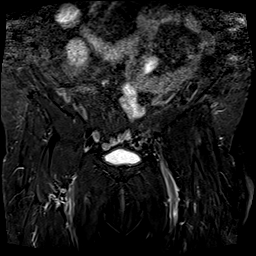
[im 38/45]
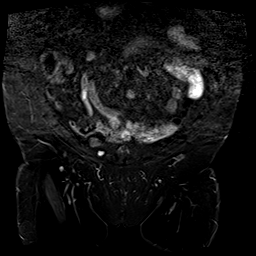
[im 45/45]
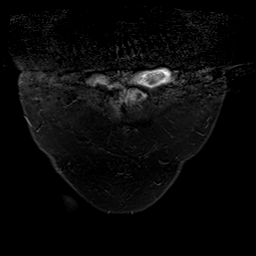

[Series 6: T1 · axial · 4.0mm · 0.78mm/px · z∈[-48,+162]mm · 7 of 49 slices shown (2 of 2)]
[im 1/49]
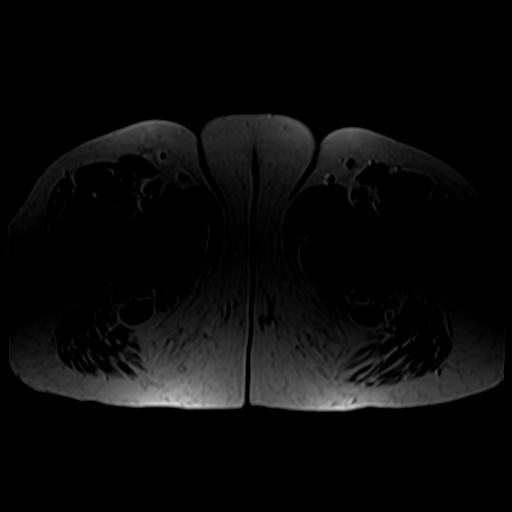
[im 7/49]
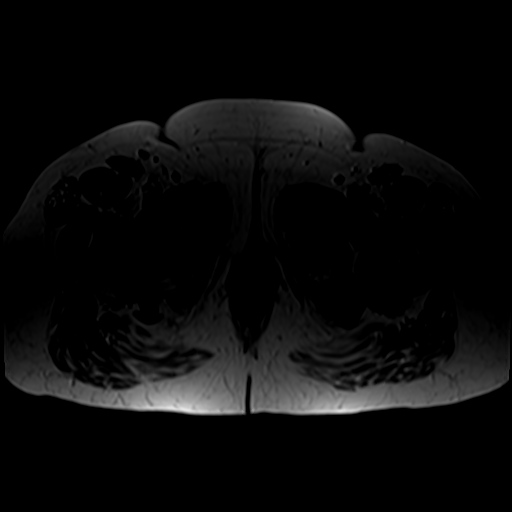
[im 13/49]
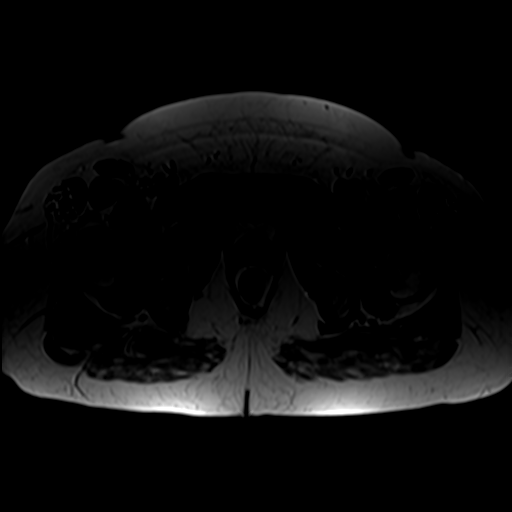
[im 19/49]
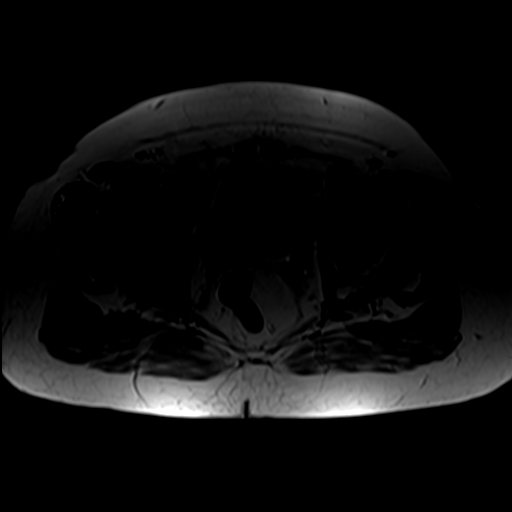
[im 25/49]
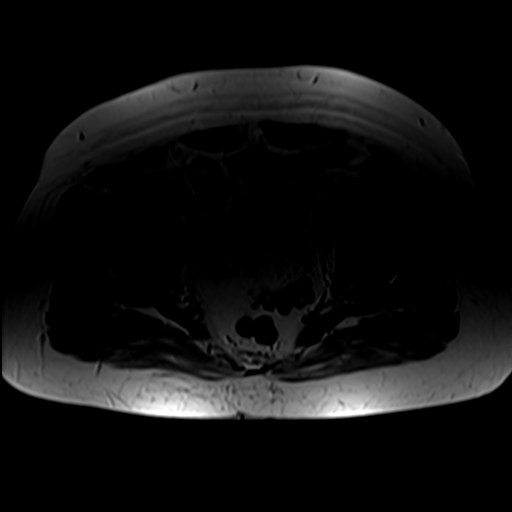
[im 31/49]
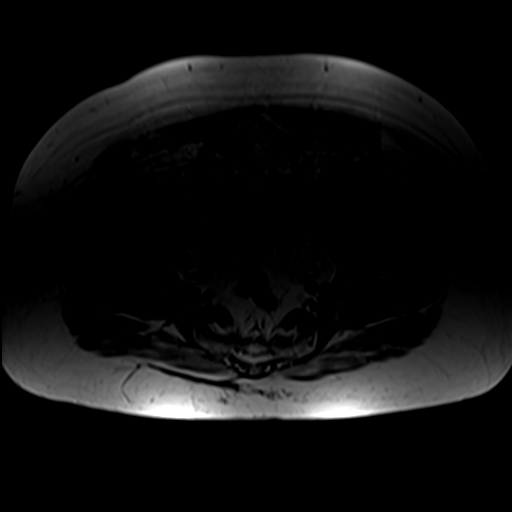
[im 43/49]
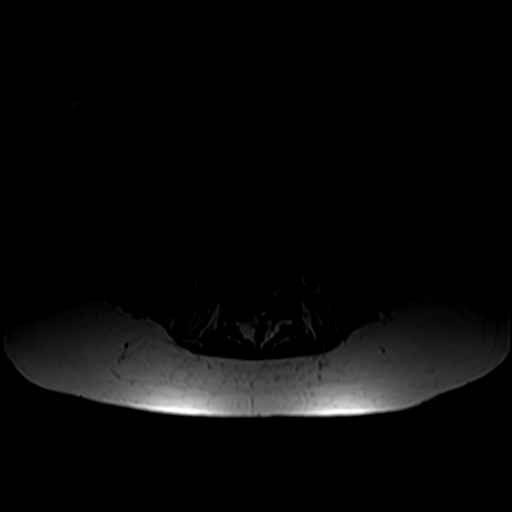

[31 of 48 positions shown; findings below may reference images not displayed]

FINDINGS: Urinary Tract:  No abnormality visualized.

Bowel:  Sigmoid diverticulosis.  No evidence of diverticulitis.

Vascular/Lymphatic: No pathologically enlarged lymph nodes. No
significant vascular abnormality seen.

Reproductive:  Prior hysterectomy.

Other:  None.

Musculoskeletal:

There is no acute fracture or evidence of avascular necrosis. The
bilateral sacroiliac joints are unremarkable.

There is mild bilateral hip osteoarthritis with degenerative
superior labral tearing.

There is mild right-sided insertional gluteus minimus tendinosis.
There is no high-grade or retracted gluteal tendon tear. There is no
significant trochanteric bursitis.

The adductor muscles are unremarkable. Increased signal at the left
proximal hamstring insertion on the ischial tuberosity, likely
low-grade partial tearing. There is mildly increased signal in the
right proximal hamstring insertion consistent with mild tendinosis.
There is no significant muscle atrophy.

There are mild lower lumbar spine degenerative changes, see
separately dictated lumbar spine MRI.
IMPRESSION: Mild bilateral hip osteoarthritis with degenerative superior labral
tearing. No acute osseous abnormality.

Mild right-sided insertional gluteus minimus tendinosis. No
high-grade or retracted gluteal tendon tear.

Low-grade partial tearing of the left proximal hamstring tendon.
Mild right-sided proximal hamstring tendinosis.

## 2020-07-23 IMAGING — MR MR LUMBAR SPINE W/O CM
4 of 5 series · 27 of 48 positions shown · non-contrast
Comparison: None.

CLINICAL DATA: Low back pain; technologist note states low back
pain into right hip and leg, fall down steps [RG]

EXAM:
MRI LUMBAR SPINE WITHOUT CONTRAST
TECHNIQUE: Multiplanar, multisequence MR imaging of the lumbar spine was
performed. No intravenous contrast was administered.

[Series 2: T2 · sagittal · 4.0mm · 1.09mm/px · 6 of 17 slices shown (1 of 2)]
[im 1/17]
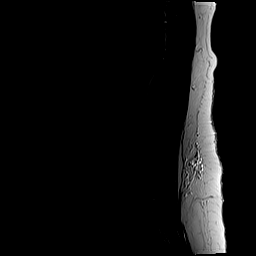
[im 4/17]
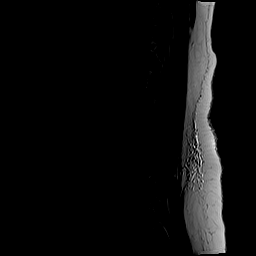
[im 7/17]
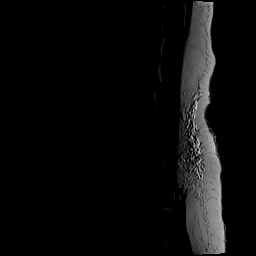
[im 10/17]
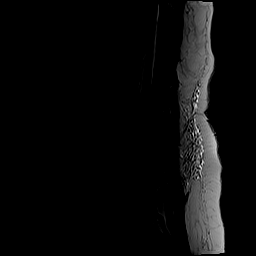
[im 13/17]
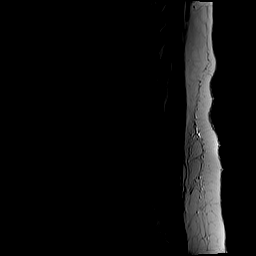
[im 17/17]
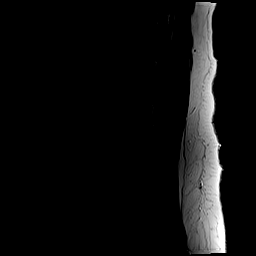

[Series 4: T1 · sagittal · 4.0mm · 1.09mm/px · 6 of 17 slices shown (1 of 2)]
[im 1/17]
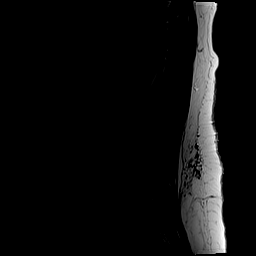
[im 4/17]
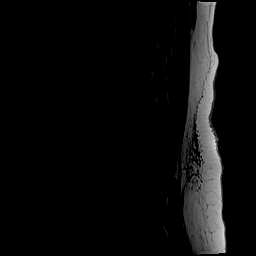
[im 7/17]
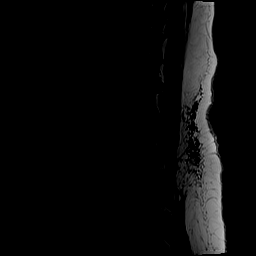
[im 10/17]
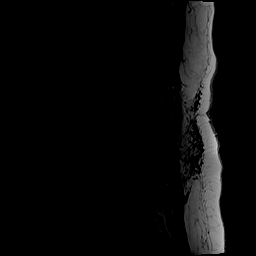
[im 13/17]
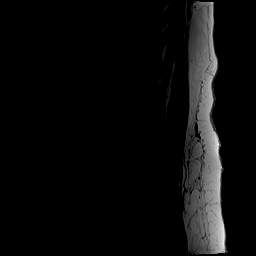
[im 17/17]
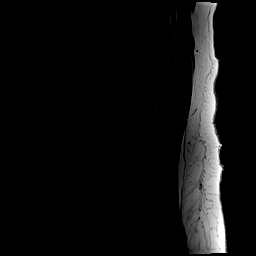

[Series 5: T2 · axial · 4.0mm · 0.39mm/px · z∈[-87,+114]mm · 9 of 40 slices shown (2 of 2)]
[im 1/40]
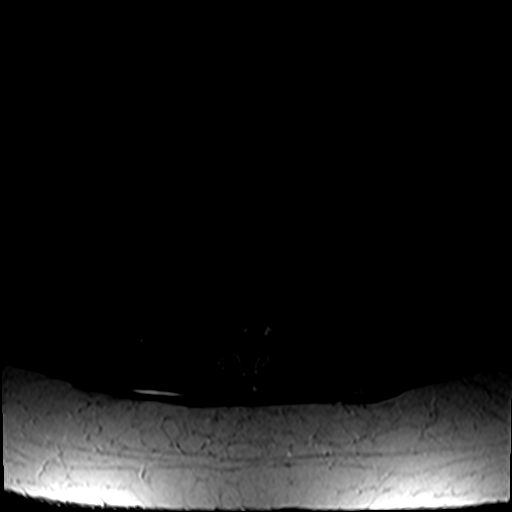
[im 6/40]
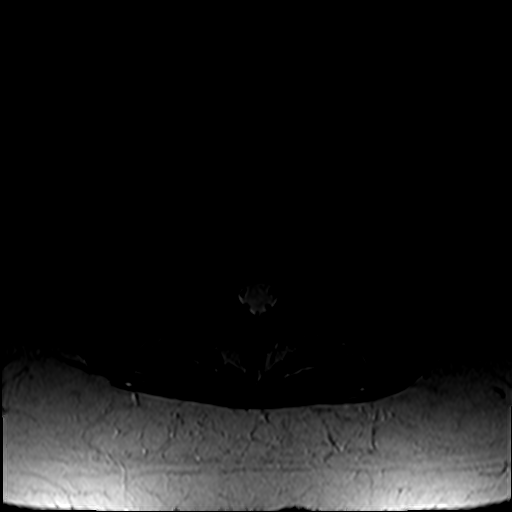
[im 12/40]
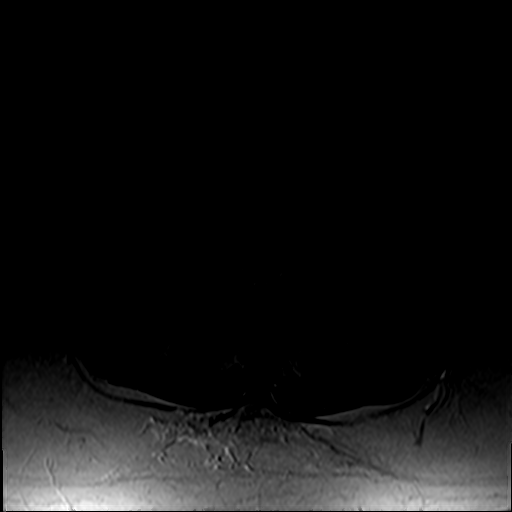
[im 17/40]
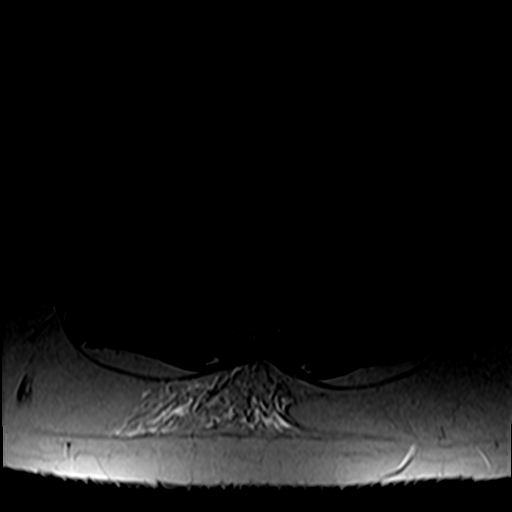
[im 20/40]
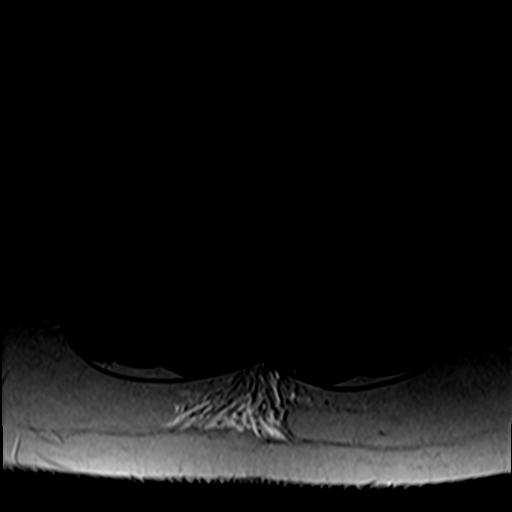
[im 23/40]
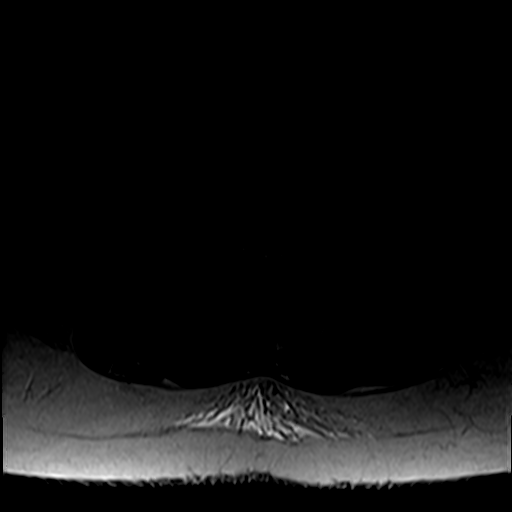
[im 28/40]
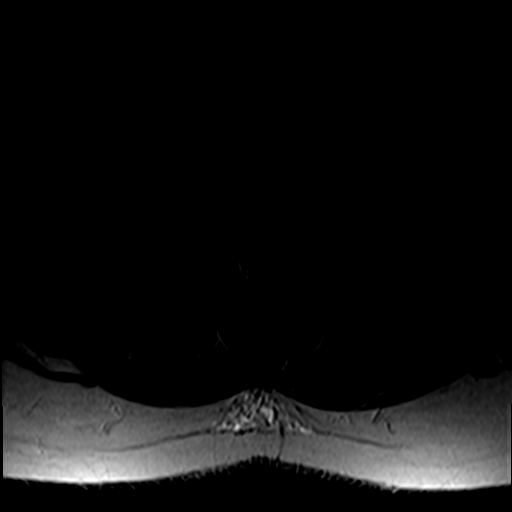
[im 34/40]
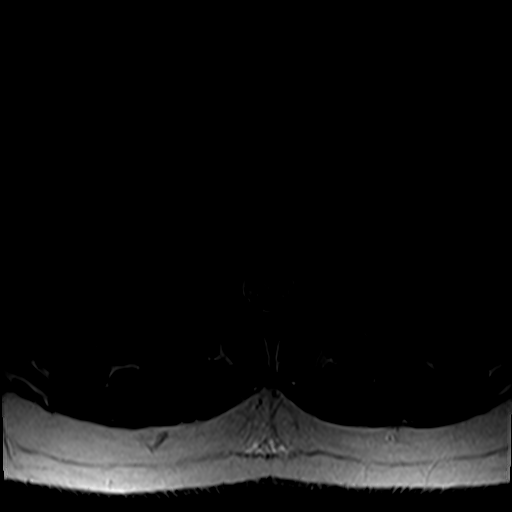
[im 40/40]
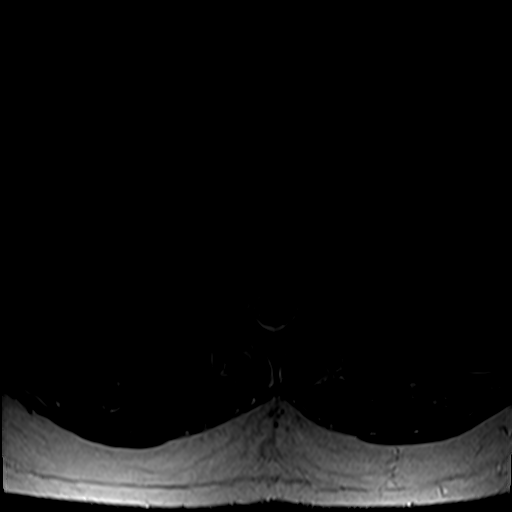

[Series 6: T1 · axial · 4.0mm · 0.39mm/px · z∈[-87,+86]mm · 6 of 40 slices shown (2 of 2)]
[im 1/40]
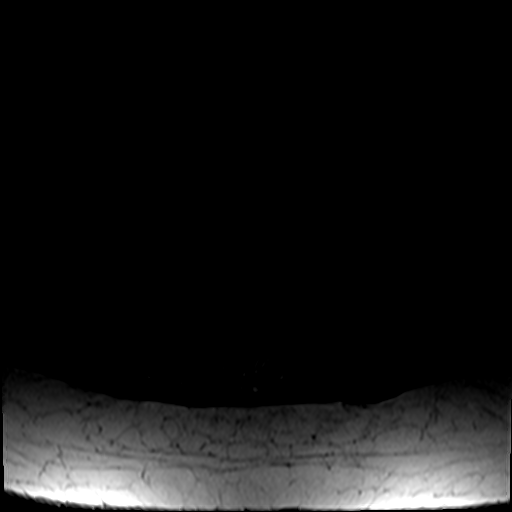
[im 6/40]
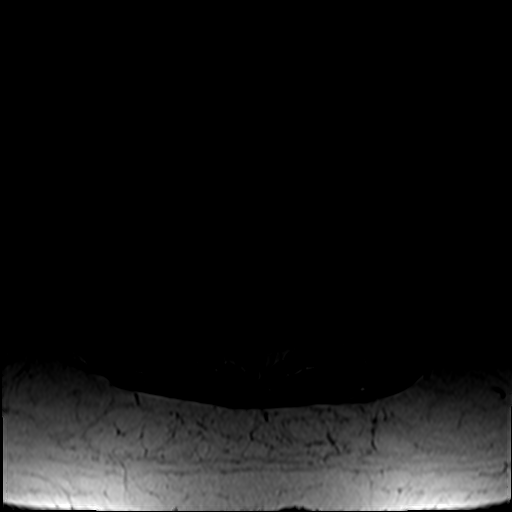
[im 12/40]
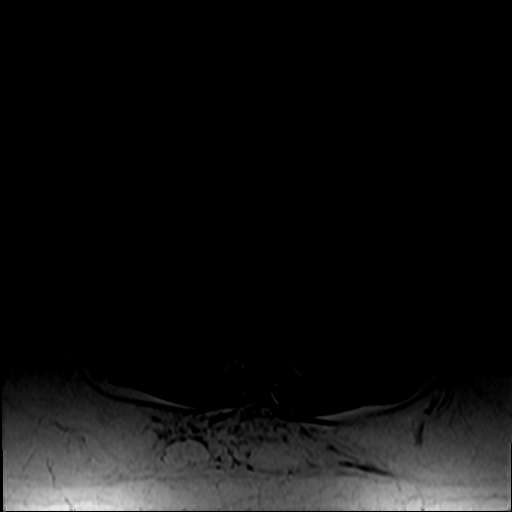
[im 17/40]
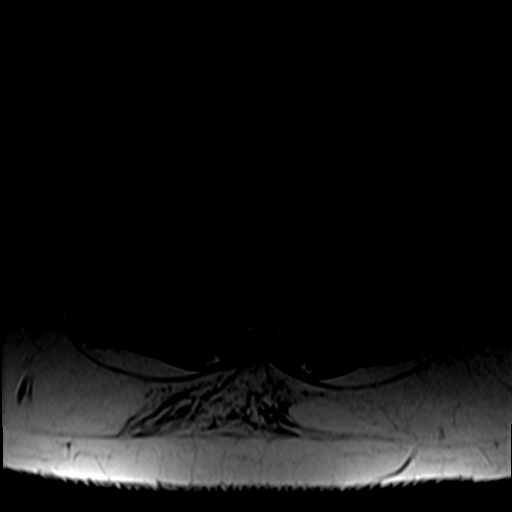
[im 20/40]
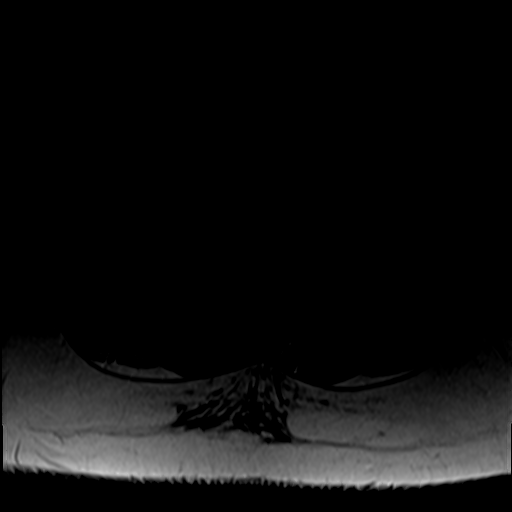
[im 34/40]
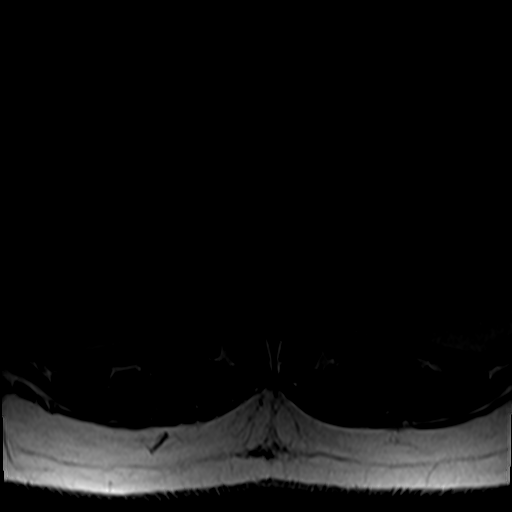

[27 of 48 positions shown; findings below may reference images not displayed]

FINDINGS: Segmentation:  Standard.

Alignment:  No significant listhesis.

Vertebrae: Vertebral body heights are maintained. No marrow edema.
No suspicious osseous lesion.

Conus medullaris and cauda equina: Conus extends to the L1-L2 level.
Conus and cauda equina appear normal.

Paraspinal and other soft tissues: Unremarkable.

Disc levels:

L1-L2:  No canal or foraminal stenosis.

L2-L3:  No canal or foraminal stenosis.

L3-L4: Trace disc bulge. Mild facet arthropathy. No canal or
foraminal stenosis.

L4-L5: Trace disc bulge. Mild to moderate facet arthropathy with
ligamentum flavum thickening. Minor canal stenosis. Slight
effacement of subarticular recesses, left greater than right. No
foraminal stenosis.

L5-S1: Disc bulge with endplate osteophytic ridging. No canal
stenosis. Slight effacement of the subarticular recesses with disc
in proximity to the traversing S1 nerve roots but no definite
compression. Minor foraminal stenosis.
IMPRESSION: Mild degenerative changes as detailed above without high-grade
stenosis.

## 2020-07-26 ENCOUNTER — Other Ambulatory Visit: Payer: Self-pay

## 2020-07-26 DIAGNOSIS — M5416 Radiculopathy, lumbar region: Secondary | ICD-10-CM

## 2020-07-28 ENCOUNTER — Ambulatory Visit
Admission: RE | Admit: 2020-07-28 | Discharge: 2020-07-28 | Disposition: A | Discharge status: Home / Self Care | Payer: Medicare HMO | Source: Ambulatory Visit | Attending: Family Medicine | Admitting: Family Medicine

## 2020-07-28 DIAGNOSIS — M5416 Radiculopathy, lumbar region: Secondary | ICD-10-CM

## 2020-07-28 DIAGNOSIS — M47817 Spondylosis without myelopathy or radiculopathy, lumbosacral region: Secondary | ICD-10-CM | POA: Diagnosis not present

## 2020-07-28 IMAGING — XA Imaging study
2 series · 2 of 2 positions shown · non-contrast
Comparison: none

CLINICAL DATA: Lumbosacral spondylosis without myelopathy. Low back
pain radiating down the outside of the right hip and leg to the
knee. No prior injections or surgery.

[Series 1: ortho standard · 1 of 1 slices shown (1 of 2)]
[im 1/1]
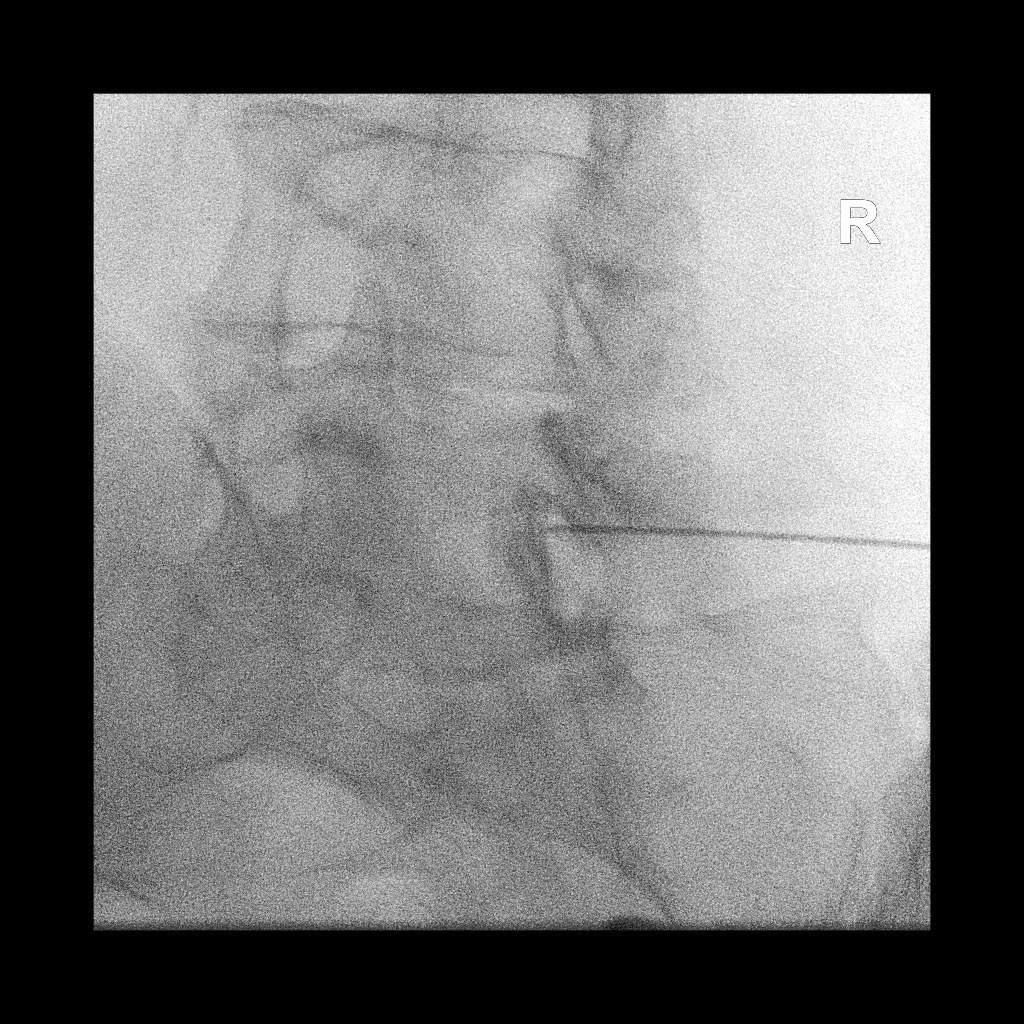

[Series 2: ortho standard · 1 of 1 slices shown (2 of 2)]
[im 1/1]
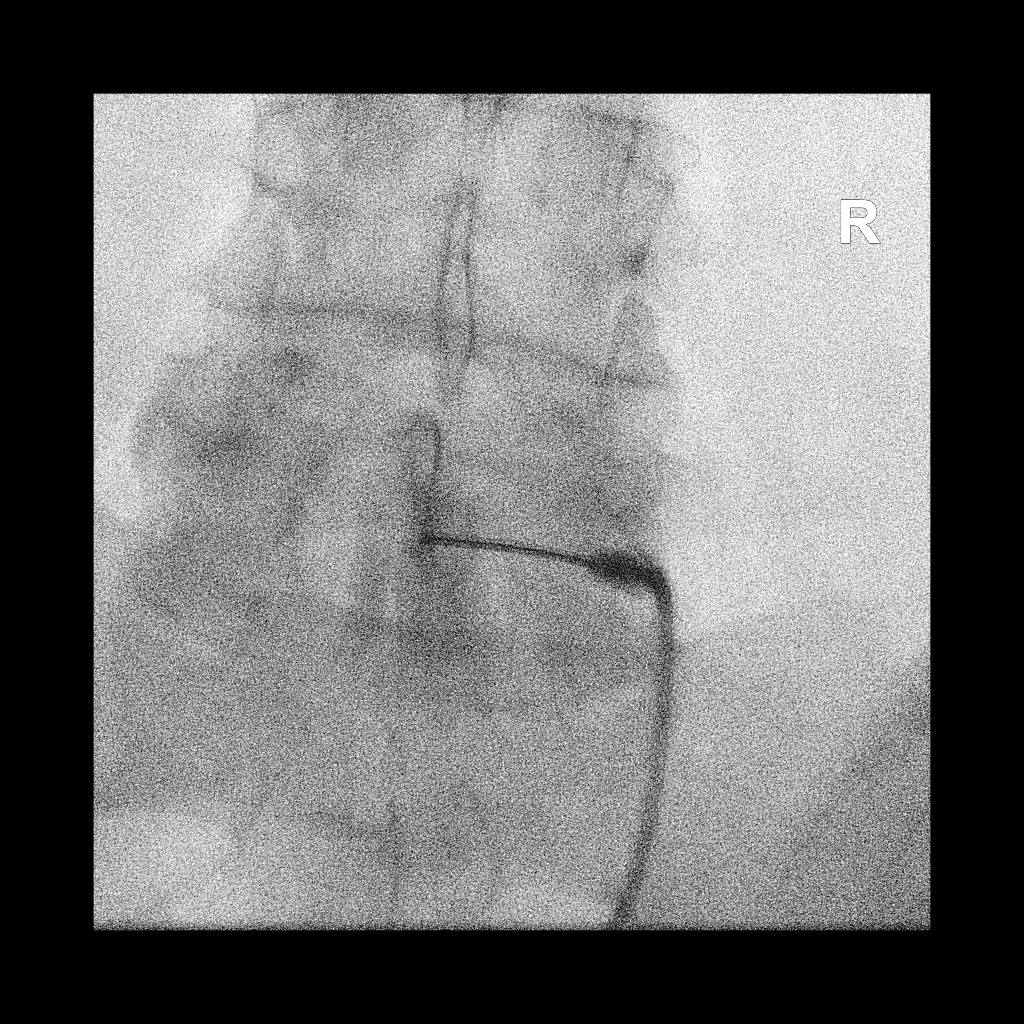

[2 of 2 positions shown; findings below may reference images not displayed]

FLUOROSCOPY TIME:  Radiation Exposure Index (as provided by the
fluoroscopic device): 2.7 mGy

Fluoroscopy Time:  25 seconds

Number of Acquired Images:  0

PROCEDURE:
The procedure, risks, benefits, and alternatives were explained to
the patient. Questions regarding the procedure were encouraged and
answered. The patient understands and consents to the procedure.

LUMBAR EPIDURAL INJECTION:

An interlaminar approach was performed on the right at L5-S1. The
overlying skin was cleansed and anesthetized. A 3.5 inch 20 gauge
epidural needle was advanced using loss-of-resistance technique.

DIAGNOSTIC EPIDURAL INJECTION:

Injection of Isovue-M 200 shows a good epidural pattern with spread
above and below the level of needle placement to both sides. No
vascular opacification is seen.

THERAPEUTIC EPIDURAL INJECTION:

80 mg of Depo-Medrol mixed with 3 mL of 1% lidocaine were instilled.
The procedure was well-tolerated, and the patient was discharged
thirty minutes following the injection in good condition.

COMPLICATIONS:
None immediate.
IMPRESSION: Technically successful interlaminar epidural injection on the right
at L5-S1.

## 2020-07-28 MED ORDER — METHYLPREDNISOLONE ACETATE 40 MG/ML INJ SUSP (RADIOLOG
80.0000 mg | Freq: Once | INTRAMUSCULAR | Status: AC
  Administered 2020-07-28: 80 mg via EPIDURAL

## 2020-07-28 MED ORDER — IOPAMIDOL (ISOVUE-M 200) INJECTION 41%
1.0000 mL | Freq: Once | INTRAMUSCULAR | Status: AC
  Administered 2020-07-28: 1 mL via EPIDURAL

## 2020-07-28 NOTE — Discharge Instructions (Signed)

## 2020-08-01 ENCOUNTER — Other Ambulatory Visit: Payer: Medicare HMO

## 2020-11-06 DIAGNOSIS — Z1231 Encounter for screening mammogram for malignant neoplasm of breast: Secondary | ICD-10-CM | POA: Diagnosis not present

## 2020-11-06 LAB — HM MAMMOGRAPHY

## 2020-11-08 ENCOUNTER — Encounter: Payer: Self-pay | Admitting: Family Medicine

## 2020-11-30 ENCOUNTER — Other Ambulatory Visit: Payer: Self-pay | Admitting: Family Medicine

## 2020-11-30 DIAGNOSIS — J300 Vasomotor rhinitis: Secondary | ICD-10-CM | POA: Diagnosis not present

## 2020-11-30 DIAGNOSIS — J4530 Mild persistent asthma, uncomplicated: Secondary | ICD-10-CM | POA: Diagnosis not present

## 2020-11-30 DIAGNOSIS — E785 Hyperlipidemia, unspecified: Secondary | ICD-10-CM

## 2020-11-30 DIAGNOSIS — J209 Acute bronchitis, unspecified: Secondary | ICD-10-CM | POA: Diagnosis not present

## 2020-11-30 DIAGNOSIS — J3089 Other allergic rhinitis: Secondary | ICD-10-CM | POA: Diagnosis not present

## 2020-12-08 DIAGNOSIS — H40013 Open angle with borderline findings, low risk, bilateral: Secondary | ICD-10-CM | POA: Diagnosis not present

## 2020-12-19 ENCOUNTER — Other Ambulatory Visit: Payer: Self-pay | Admitting: Family Medicine

## 2020-12-19 DIAGNOSIS — M109 Gout, unspecified: Secondary | ICD-10-CM

## 2020-12-19 DIAGNOSIS — E1129 Type 2 diabetes mellitus with other diabetic kidney complication: Secondary | ICD-10-CM

## 2020-12-27 ENCOUNTER — Ambulatory Visit: Payer: Medicare HMO | Admitting: Family Medicine

## 2021-02-07 ENCOUNTER — Encounter: Payer: Self-pay | Admitting: Family Medicine

## 2021-02-07 ENCOUNTER — Ambulatory Visit (INDEPENDENT_AMBULATORY_CARE_PROVIDER_SITE_OTHER): Payer: Medicare HMO | Admitting: Family Medicine

## 2021-02-07 VITALS — BP 130/78 | HR 80 | Temp 98.4°F | Resp 16 | Ht 65.0 in | Wt 194.0 lb

## 2021-02-07 DIAGNOSIS — R809 Proteinuria, unspecified: Secondary | ICD-10-CM | POA: Diagnosis not present

## 2021-02-07 DIAGNOSIS — E1129 Type 2 diabetes mellitus with other diabetic kidney complication: Secondary | ICD-10-CM

## 2021-02-07 DIAGNOSIS — E785 Hyperlipidemia, unspecified: Secondary | ICD-10-CM

## 2021-02-07 DIAGNOSIS — M109 Gout, unspecified: Secondary | ICD-10-CM | POA: Diagnosis not present

## 2021-02-07 LAB — COMPREHENSIVE METABOLIC PANEL
ALT: 32 U/L (ref 0–35)
AST: 26 U/L (ref 0–37)
Albumin: 4.2 g/dL (ref 3.5–5.2)
Alkaline Phosphatase: 95 U/L (ref 39–117)
BUN: 14 mg/dL (ref 6–23)
CO2: 31 mEq/L (ref 19–32)
Calcium: 9.8 mg/dL (ref 8.4–10.5)
Chloride: 103 mEq/L (ref 96–112)
Creatinine, Ser: 0.84 mg/dL (ref 0.40–1.20)
GFR: 69.81 mL/min (ref >60.00)
Glucose, Bld: 92 mg/dL (ref 70–99)
Potassium: 4.3 mEq/L (ref 3.5–5.1)
Sodium: 142 mEq/L (ref 135–145)
Total Bilirubin: 0.4 mg/dL (ref 0.2–1.2)
Total Protein: 6.9 g/dL (ref 6.0–8.3)

## 2021-02-07 LAB — LIPID PANEL
Cholesterol: 156 mg/dL (ref 0–200)
HDL: 48.4 mg/dL (ref >39.00)
LDL Cholesterol: 71 mg/dL (ref 0–99)
NonHDL: 107.91
Total CHOL/HDL Ratio: 3
Triglycerides: 185 mg/dL — ABNORMAL HIGH (ref 0.0–149.0)
VLDL: 37 mg/dL (ref 0.0–40.0)

## 2021-02-07 LAB — HEMOGLOBIN A1C: Hgb A1c MFr Bld: 7.2 % — ABNORMAL HIGH (ref 4.6–6.5)

## 2021-02-07 MED ORDER — MONTELUKAST SODIUM 10 MG PO TABS: 10.0000 mg | ORAL_TABLET | Freq: Every day | ORAL | 3 refills | Status: AC

## 2021-02-07 MED ORDER — ROSUVASTATIN CALCIUM 10 MG PO TABS: ORAL_TABLET | ORAL | 1 refills | Status: DC

## 2021-02-07 MED ORDER — LOSARTAN POTASSIUM 50 MG PO TABS: 25.0000 mg | ORAL_TABLET | Freq: Every day | ORAL | 1 refills | Status: DC

## 2021-02-07 MED ORDER — ALLOPURINOL 100 MG PO TABS: 100.0000 mg | ORAL_TABLET | Freq: Two times a day (BID) | ORAL | 1 refills | Status: DC

## 2021-02-07 NOTE — Progress Notes (Signed)
Subjective:  Patient ID: Sabrina Knight, female    DOB: 03/15/1950  Age: 71 y.o. MRN: [id number]  CC:  Chief Complaint  Patient presents with   Diabetes   Hyperlipidemia    HPI Sabrina Knight presents for   Diabetes: With microalbuminuria, diet controlled.  She is on statin, prior ACE inhibitor caused cough.  Now on ARB. Doing ok - still caregiving, life is better, managing better.  Water aerobics m/w/f  has been helpful.  Home readings fasting:none  Home readings postprandial:none.  No new increased thirst, urinary frequency Microalbumin: Ratio of 30 on 06/24/2019 Optho, foot exam, pneumovax: UTD.   Lab Results  Component Value Date   HGBA1C 7.0 (H) 06/29/2020   HGBA1C 6.7 (H) 12/29/2019   HGBA1C 6.6 (H) 06/24/2019   Lab Results  Component Value Date   LDLCALC 120 (H) 12/29/2019   CREATININE 0.85 06/29/2020   Wt Readings from Last 3 Encounters:  02/07/21 194 lb (88 kg)  07/12/20 196 lb (88.9 kg)  06/29/20 197 lb 6.4 oz (89.5 kg)   Hyperlipidemia: Intermittent dosing Crestor 4 days/week at her last visit in June 2022.  Some weeks daily - no side effects on daily dose.   Lab Results  Component Value Date   CHOL 180 06/29/2020   HDL 54.20 06/29/2020   LDLCALC 120 (H) 12/29/2019   LDLDIRECT 93.0 06/29/2020   TRIG 207.0 (H) 06/29/2020   CHOLHDL 3 06/29/2020   Lab Results  Component Value Date   ALT 24 06/29/2020   AST 16 06/29/2020   ALKPHOS 77 06/29/2020   BILITOT 0.3 06/29/2020   Losartan 25mg  qd.  Home readings:none.  BP Readings from Last 3 Encounters:  02/07/21 130/78  07/28/20 135/66  07/12/20 116/72   Lab Results  Component Value Date   CREATININE 0.85 06/29/2020   Allopurinol 100 mg twice daily for gout.  No recent flares, no recent side effects.    History Patient Active Problem List   Diagnosis Date Noted   Degenerative disc disease, lumbar 07/12/2020   Degenerative arthritis of left knee 05/26/2020   Greater trochanteric bursitis  of both hips 05/26/2020   Mild persistent asthma, uncomplicated 55/73/2202   Vasomotor rhinitis 01/08/2019   Other allergic rhinitis 01/08/2019   Bilateral carpal tunnel syndrome 09/25/2018   Dyslipidemia 10/22/2017   Statin intolerance 10/22/2017   Newly diagnosed diabetes ([place]) 10/22/2017   Gout 12/23/2016   Class 1 obesity due to excess calories with serious comorbidity and body mass index (BMI) of 31.0 to 31.9 in adult 05/19/2016   Osteopenia of necks of both femurs 05/19/2016   Thrombocytosis 02/08/2014   High cholesterol 02/01/2013   H/O splenectomy 02/01/2013   Non-allergic rhinitis 02/01/2013   Glaucoma 02/01/2013   Past Medical History:  Diagnosis Date   Allergy    Zyrtec, Singulair; allergy testing negative; [facility] Feil.   Arthritis    Asthma due to seasonal allergies    Cataract    Clotting disorder ([place])    Glaucoma    Gout    ITP (idiopathic thrombocytopenic purpura)    Past Surgical History:  Procedure Laterality Date   ABDOMINAL HYSTERECTOMY  01/22/1999   ovaries resected; DUB/fibroids   CARPAL TUNNEL RELEASE Right 09/11/2018   Procedure: CARPAL TUNNEL RELEASE;  Surgeon: Daryll Brod, MD;  Location: [facility];  Service: Orthopedics;  Laterality: Right;   CARPAL TUNNEL RELEASE Left 10/02/2018   Procedure: CARPAL TUNNEL RELEASE;  Surgeon: Daryll Brod, MD;  Location: [facility];  Service: Orthopedics;  Laterality: Left;  FAB   EYE SURGERY N/A    Phreesia 06/26/2019   spleen removal  01/22/1995   for ITP   spleen removal     Allergies  Allergen Reactions   Lisinopril Cough   Prior to Admission medications   Medication Sig Start Date End Date Taking? Authorizing Provider  allopurinol (ZYLOPRIM) 100 MG tablet TAKE 1 TABLET BY MOUTH TWICE A DAY 12/19/20  Yes Wendie Agreste, MD  cetirizine (ZYRTEC) 10 MG tablet 1 tablet   Yes [provider]  co-enzyme Q-10 30 MG capsule Take by mouth.   Yes [provider]   cyanocobalamin 100 MCG tablet Take by mouth.   Yes [provider]  fexofenadine (ALLEGRA) 180 MG tablet TAKE 1 TABLET BY MOUTH EVERY DAY 10/28/16  Yes [provider]  Javier Docker Oil 300 MG CAPS Take by mouth.   Yes [provider]  losartan (COZAAR) 50 MG tablet TAKE 1/2 TABLET BY MOUTH EVERY DAY 12/19/20  Yes Wendie Agreste, MD  montelukast (SINGULAIR) 10 MG tablet Take 1 tablet (10 mg total) by mouth daily. 02/23/20  Yes Wendie Agreste, MD  Multiple Vitamin (MULTIVITAMIN WITH MINERALS) TABS tablet Take 1 tablet by mouth daily.   Yes [provider]  rosuvastatin (CRESTOR) 10 MG tablet TAKE 1 TAB BY MOUTH DAILY IN THE EVENING 5 DAYS PER WEEK. 12/01/20  Yes Wendie Agreste, MD  VITAMIN D, CHOLECALCIFEROL, PO Take by mouth.   Yes [provider]  vitamin E 180 MG (400 UNITS) capsule Take 400 Units by mouth daily.   Yes [provider]  allopurinol (ZYLOPRIM) 100 MG tablet Take 1 tablet by mouth daily. Patient not taking: Reported on 02/07/2021    [provider]  cetirizine (ZYRTEC) 10 MG tablet Take 10 mg by mouth daily. Patient not taking: Reported on 02/07/2021    [provider]  montelukast (SINGULAIR) 10 MG tablet 1 tablet in the evening once a day orally 30 days Patient not taking: Reported on 02/07/2021    [provider]   Social History   Socioeconomic History   Marital status: Widowed    Spouse name: Not on file   Number of children: 0   Years of education: Not on file   Highest education level: Not on file  Occupational History   Occupation: Retired  Tobacco Use   Smoking status: Former    Packs/day: 0.50    Types: Cigarettes    Quit date: 12/04/2003    Years since quitting: 17.1   Smokeless tobacco: Never  Vaping Use   Vaping Use: Never used  Substance and Sexual Activity   Alcohol use: No    Alcohol/week: 0.0 standard drinks   Drug use: No   Sexual activity: Not Currently    Birth  control/protection: Surgical  Other Topics Concern   Not on file  Social History Narrative   Marital status:  Widowed x since 2001 from [place]; married x 27 years.  Not dating really in 2019.      Children:  None; failed in vitro.      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      Tobacco: quit ; smoked 20 years.      Alcohol: none      Education: [employer].       Exercise: Yes; 3 times per week 2 hours deep water aerobics.  ADLs: independent ADLs; no assistant devices.      Advanced Directives:  None; FULL CODE; no prolonged measures.    HCPOA: Darlene Jackson/sister-in-law.     Social Determinants of Health   Financial Resource Strain: Not on file  Food Insecurity: Not on file  Transportation Needs: Not on file  Physical Activity: Not on file  Stress: Not on file  Social Connections: Not on file  Intimate Partner Violence: Not on file    Review of Systems  Constitutional:  Negative for fatigue and unexpected weight change.  Respiratory:  Negative for chest tightness and shortness of breath.   Cardiovascular:  Negative for chest pain, palpitations and leg swelling.  Gastrointestinal:  Negative for abdominal pain and blood in stool.  Neurological:  Negative for dizziness, syncope, light-headedness and headaches.    Objective:   Vitals:   02/07/21 1130  BP: 130/78  Pulse: 80  Resp: 16  Temp: 98.4 F (36.9 C)  TempSrc: Temporal  SpO2: 97%  Weight: 194 lb (88 kg)  Height: 5\' 5"  (1.651 m)     Physical Exam Vitals reviewed.  Constitutional:      Appearance: Normal appearance. She is well-developed.  HENT:     Head: Normocephalic and atraumatic.  Eyes:     Conjunctiva/sclera: Conjunctivae normal.     Pupils: Pupils are equal, round, and reactive to light.  Neck:     Vascular: No carotid bruit.  Cardiovascular:     Rate and Rhythm: Normal rate and regular rhythm.     Heart sounds: Normal heart sounds.  Pulmonary:      Effort: Pulmonary effort is normal.     Breath sounds: Normal breath sounds.  Abdominal:     Palpations: Abdomen is soft. There is no pulsatile mass.     Tenderness: There is no abdominal tenderness.  Musculoskeletal:     Right lower leg: No edema.     Left lower leg: No edema.  Skin:    General: Skin is warm and dry.  Neurological:     Mental Status: She is alert and oriented to person, place, and time.  Psychiatric:        Mood and Affect: Mood normal.        Behavior: Behavior normal.     Assessment & Plan:  Sabrina Knight is a 71 y.o. female . Type 2 diabetes mellitus with microalbuminuria, without long-term current use of insulin (HCC) - Plan: Comprehensive metabolic panel, Hemoglobin A1c, losartan (COZAAR) 50 MG tablet  -Plan to continue diet approach, activity, with exercise as above.  Commended on her activity routine.  Weight stable.  Check updated A1c as slight increase last visit.  Consider low-dose metformin if continued increase.  35-month follow-up.  Hyperlipidemia, unspecified hyperlipidemia type - Plan: Comprehensive metabolic panel, Lipid panel, rosuvastatin (CRESTOR) 10 MG tablet  -  Stable, tolerating current regimen. Medications refilled. Labs pending as above.   Gout, unspecified cause, unspecified chronicity, unspecified site - Plan: allopurinol (ZYLOPRIM) 100 MG tablet  -Stable without recent flare, continue allopurinol.  Labs pending above.  Meds ordered this encounter  Medications   allopurinol (ZYLOPRIM) 100 MG tablet    Sig: Take 1 tablet (100 mg total) by mouth 2 (two) times daily.    Dispense:  180 tablet    Refill:  1   losartan (COZAAR) 50 MG tablet    Sig: Take 0.5 tablets (25 mg total) by mouth daily.    Dispense:  45 tablet    Refill:  1   rosuvastatin (CRESTOR) 10 MG tablet    Sig: TAKE 1 TAB BY MOUTH DAILY IN THE EVENING 5 DAYS PER WEEK.    Dispense:  90 tablet    Refill:  1   montelukast (SINGULAIR) 10 MG tablet    Sig: Take 1  tablet (10 mg total) by mouth daily.    Dispense:  90 tablet    Refill:  3   There are no Patient Instructions on file for this visit.    Signed,   Merri Ray, MD [place] 02/07/21 11:31 AM

## 2021-04-09 DIAGNOSIS — J300 Vasomotor rhinitis: Secondary | ICD-10-CM | POA: Diagnosis not present

## 2021-04-09 DIAGNOSIS — J4530 Mild persistent asthma, uncomplicated: Secondary | ICD-10-CM | POA: Diagnosis not present

## 2021-04-09 DIAGNOSIS — J3089 Other allergic rhinitis: Secondary | ICD-10-CM | POA: Diagnosis not present

## 2021-04-09 DIAGNOSIS — R051 Acute cough: Secondary | ICD-10-CM | POA: Diagnosis not present

## 2021-04-26 ENCOUNTER — Ambulatory Visit (INDEPENDENT_AMBULATORY_CARE_PROVIDER_SITE_OTHER): Payer: Medicare HMO

## 2021-04-26 DIAGNOSIS — Z0000 Encounter for general adult medical examination without abnormal findings: Secondary | ICD-10-CM

## 2021-04-26 NOTE — Progress Notes (Signed)
? ?Subjective:  ? Sabrina Knight is a 71 y.o. female who presents for Medicare Annual (Subsequent) preventive examination. ? ?I connected with Sabrina Knight today by telephone and verified that I am speaking with the correct person using two identifiers. ?Location patient: home ?Location provider: work ?Persons participating in the virtual visit: patient, provider. ?  ?I discussed the limitations, risks, security and privacy concerns of performing an evaluation and management service by telephone and the availability of in person appointments. I also discussed with the patient that there may be a patient responsible charge related to this service. The patient expressed understanding and verbally consented to this telephonic visit.  ?  ?Interactive audio and video telecommunications were attempted between this provider and patient, however failed, due to patient having technical difficulties OR patient did not have access to video capability.  We continued and completed visit with audio only. ? ?  ?Review of Systems    ? ?Cardiac Risk Factors include: advanced age (>43mn, >>15women) ? ?   ?Objective:  ?  ?Today's Vitals  ? ?There is no height or weight on file to calculate BMI. ? ? ?  04/26/2021  ?  8:24 AM 10/02/2018  ?  9:55 AM 09/25/2018  ?  9:44 AM 09/11/2018  ?  1:06 PM 09/07/2018  ? 12:59 PM 04/07/2017  ?  8:25 AM 04/21/2015  ?  1:05 PM  ?Advanced Directives  ?Does Patient Have a Medical Advance Directive? No No No No No No No  ?Would patient like information on creating a medical advance directive? No - Patient declined No - Patient declined No - Patient declined No - Patient declined No - Patient declined No - Patient declined No - patient declined information  ? ? ?Current Medications (verified) ?Outpatient Encounter Medications as of 04/26/2021  ?Medication Sig  ? allopurinol (ZYLOPRIM) 100 MG tablet Take 1 tablet (100 mg total) by mouth 2 (two) times daily.  ? Biotin 1 MG CAPS Take by mouth.  ? cetirizine  (ZYRTEC) 10 MG tablet 1 tablet  ? co-enzyme Q-10 30 MG capsule Take by mouth.  ? cyanocobalamin 100 MCG tablet Take by mouth.  ? Krill Oil 300 MG CAPS Take by mouth.  ? losartan (COZAAR) 50 MG tablet Take 0.5 tablets (25 mg total) by mouth daily.  ? magnesium gluconate (MAGONATE) 500 MG tablet Take 500 mg by mouth 2 (two) times daily.  ? montelukast (SINGULAIR) 10 MG tablet Take 1 tablet (10 mg total) by mouth daily.  ? Multiple Vitamin (MULTIVITAMIN WITH MINERALS) TABS tablet Take 1 tablet by mouth daily.  ? rosuvastatin (CRESTOR) 10 MG tablet TAKE 1 TAB BY MOUTH DAILY IN THE EVENING 5 DAYS PER WEEK.  ? Tart Cherry 1200 MG CAPS Take by mouth.  ? VITAMIN D, CHOLECALCIFEROL, PO Take by mouth.  ? vitamin E 180 MG (400 UNITS) capsule Take 400 Units by mouth daily.  ? vitamin k 100 MCG tablet Take 100 mcg by mouth daily.  ? fexofenadine (ALLEGRA) 180 MG tablet TAKE 1 TABLET BY MOUTH EVERY DAY (Patient not taking: Reported on 04/26/2021)  ? ?No facility-administered encounter medications on file as of 04/26/2021.  ? ? ?Allergies (verified) ?Lisinopril  ? ?History: ?Past Medical History:  ?Diagnosis Date  ? Allergy   ? Zyrtec, Singulair; allergy testing negative; [facility] Feil  ? Arthritis   ? Asthma due to seasonal allergies   ? [place]act   ? [place]   ? Glaucoma   ? Gout   ?  ITP (idiopathic thrombocytopenic purpura)   ? ?Past Surgical History:  ?Procedure Laterality Date  ? ABDOMINAL HYSTERECTOMY  01/22/1999  ? ovaries resected; DUB/fibroids  ? CARPAL TUNNEL RELEASE Right 09/11/2018  ? Procedure: CARPAL TUNNEL RELEASE;  Surgeon: Daryll Brod, MD;  Location: [facility];  Service: Orthopedics;  Laterality: Right;  ? CARPAL TUNNEL RELEASE Left 10/02/2018  ? Procedure: CARPAL TUNNEL RELEASE;  Surgeon: Daryll Brod, MD;  Location: [facility];  Service: Orthopedics;  Laterality: Left;  FAB  ? EYE SURGERY N/A   ? [facility]rees[facility] 06/26/2019  ? spleen removal  01/22/1995  ? for ITP  ? spleen removal     ? ?Family History  ?Problem Relation Age of Onset  ? Cancer Mother 36  ?     lung cancer  ? Hypertension Mother   ? Hyperlipidemia Mother   ? Mental illness Mother   ? Hypertension Sister   ? Hypertension Brother   ? Diabetes Brother   ? Cancer Brother 66  ?     bladder cancer; non-smoker  ? Heart disease Brother 63  ?     CABG/CAD  ? Hypertension Sister   ? Diabetes Sister   ? Hypertension Brother   ? Diabetes Brother   ? Hypertension Brother   ? Diabetes Brother   ? Hypertension Brother   ? COPD Father   ? ?Social History  ? ?Socioeconomic History  ? Marital status: Widowed  ?  Spouse name: Not on file  ? Number of children: 0  ? Years of education: Not on file  ? Highest education level: Not on file  ?Occupational History  ? Occupation: Retired  ?Tobacco Use  ? Smoking status: Former  ?  Packs/day: 0.50  ?  Types: Cigarettes  ?  Quit date: 12/04/2003  ?  Years since quitting: 17.4  ? Smokeless tobacco: Never  ?Vaping Use  ? Vaping Use: Never used  ?Substance and Sexual Activity  ? Alcohol use: No  ?  Alcohol/week: 0.0 standard drinks  ? Drug use: No  ? Sexual activity: Not Currently  ?  Birth control/protection: Surgical  ?Other Topics Concern  ? Not on file  ?Social History Narrative  ? Marital status:  Widowed x since 2001 from [place]; married x 27 years.  Not dating really in 2019.  ?    Children:  None; failed in vitro.  ?    Lives: alone  ?    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  ?    Tobacco: quit ; smoked 20 years.  ?    Alcohol: none  ?    Education: [employer].   ?    Exercise: Yes; 3 times per week 2 hours deep water aerobics.  ?    ADLs: independent ADLs; no assistant devices.  ?    Advanced Directives:  None; FULL CODE; no prolonged measures.    HCPOA: Darlene Jackson/sister-in-law.    ? ?Social Determinants of Health  ? ?Financial Resource Strain: Low Risk   ? Difficulty of Paying Living Expenses: Not hard at all  ?Food Insecurity: No Food Insecurity  ? Worried About  Charity fundraiser in the Last Year: Never true  ? Ran Out of Food in the Last Year: Never true  ?Transportation Needs: No Transportation Needs  ? Lack of Transportation (Medical): No  ? Lack of Transportation (Non-Medical): No  ?Physical Activity: Sufficiently Active  ? Days of Exercise per Week: 3 days  ?  Minutes of Exercise per Session: 60 min  ?Stress: No Stress Concern Present  ? Feeling of Stress : Not at all  ?Social Connections: Moderately Isolated  ? Frequency of Communication with Friends and Family: Twice a week  ? Frequency of Social Gatherings with Friends and Family: Twice a week  ? Attends Religious Services: 1 to 4 times per year  ? Active Member of Clubs or Organizations: No  ? Attends Archivist Meetings: Never  ? Marital Status: Widowed  ? ? ?Tobacco Counseling ?Counseling given: Not Answered ? ? ?Clinical Intake: ? ?Pre-visit preparation completed: Yes ? ?Pain : No/denies pain ? ?  ? ?Nutritional Risks: None ?Diabetes: No ? ?How often do you need to have someone help you when you read instructions, pamphlets, or other written materials from your doctor or pharmacy?: 1 - Never ?What is the last grade level you completed in school?: High School ? ?Diabetic?no  ? ?Interpreter Needed?: No ? ?Information entered by :: [email or web address] ? ? ?Activities of Daily Living ? ?  04/26/2021  ?  8:28 AM  ?In your present state of health, do you have any difficulty performing the following activities:  ?Hearing? 0  ?Vision? 0  ?Difficulty concentrating or making decisions? 0  ?Walking or climbing stairs? 0  ?Dressing or bathing? 0  ?Doing errands, shopping? 0  ?Preparing Food and eating ? N  ?Using the Toilet? N  ?In the past six months, have you accidently leaked urine? N  ?Do you have problems with loss of bowel control? N  ?Managing your Medications? N  ?Managing your Finances? N  ?Housekeeping or managing your Housekeeping? N  ? ? ?Patient Care Team: ?Wendie Agreste, MD as PCP - General (Family  Medicine) ? ?Indicate any recent Medical Services you may have received from other than [facility] providers in the past year (date may be approximate). ? ?   ?Assessment:  ? This is a routine wellness examination for Tamee. ? ?

## 2021-04-26 NOTE — Patient Instructions (Signed)
Ms. Hollingshead , ?Thank you for taking time to come for your Medicare Wellness Visit. I appreciate your ongoing commitment to your health goals. Please review the following plan we discussed and let me know if I can assist you in the future.  ? ?Screening recommendations/referrals: ?Colonoscopy: 02/26/2017  due 2029 ?Mammogram: 11/06/2020 ?Bone Density: 10/16/2017 ?Recommended yearly ophthalmology/optometry visit for glaucoma screening and checkup ?Recommended yearly dental visit for hygiene and checkup ? ?Vaccinations: ?Influenza vaccine: completed  ?Pneumococcal vaccine: completed  ?Tdap vaccine: 06/09/2020 ?Shingles vaccine: will consider    ? ?Advanced directives: none  ? ?Conditions/risks identified: none  ? ?Next appointment: none  ? ? ?Preventive Care 25 Years and Older, Female ?Preventive care refers to lifestyle choices and visits with your health care provider that can promote health and wellness. ?What does preventive care include? ?A yearly physical exam. This is also called an annual well check. ?Dental exams once or twice a year. ?Routine eye exams. Ask your health care provider how often you should have your eyes checked. ?Personal lifestyle choices, including: ?Daily care of your teeth and gums. ?Regular physical activity. ?Eating a healthy diet. ?Avoiding tobacco and drug use. ?Limiting alcohol use. ?Practicing safe sex. ?Taking low-dose aspirin every day. ?Taking vitamin and mineral supplements as recommended by your health care provider. ?What happens during an annual well check? ?The services and screenings done by your health care provider during your annual well check will depend on your age, overall health, lifestyle risk factors, and family history of disease. ?Counseling  ?Your health care provider may ask you questions about your: ?Alcohol use. ?Tobacco use. ?Drug use. ?Emotional well-being. ?Home and relationship well-being. ?Sexual activity. ?Eating habits. ?History of falls. ?Memory and  ability to understand (cognition). ?Work and work Statistician. ?Reproductive health. ?Screening  ?You may have the following tests or measurements: ?Height, weight, and BMI. ?Blood pressure. ?Lipid and cholesterol levels. These may be checked every 5 years, or more frequently if you are over 17 years old. ?Skin check. ?Lung cancer screening. You may have this screening every year starting at age 43 if you have a 30-pack-year history of smoking and currently smoke or have quit within the past 15 years. ?Fecal occult blood test (FOBT) of the stool. You may have this test every year starting at age 15. ?Flexible sigmoidoscopy or colonoscopy. You may have a sigmoidoscopy every 5 years or a colonoscopy every 10 years starting at age 83. ?Hepatitis C blood test. ?Hepatitis B blood test. ?Sexually transmitted disease (STD) testing. ?Diabetes screening. This is done by checking your blood sugar (glucose) after you have not eaten for a while (fasting). You may have this done every 1-3 years. ?Bone density scan. This is done to screen for osteoporosis. You may have this done starting at age 46. ?Mammogram. This may be done every 1-2 years. Talk to your health care provider about how often you should have regular mammograms. ?Talk with your health care provider about your test results, treatment options, and if necessary, the need for more tests. ?Vaccines  ?Your health care provider may recommend certain vaccines, such as: ?Influenza vaccine. This is recommended every year. ?Tetanus, diphtheria, and acellular pertussis (Tdap, Td) vaccine. You may need a Td booster every 10 years. ?Zoster vaccine. You may need this after age 29. ?Pneumococcal 13-valent conjugate (PCV13) vaccine. One dose is recommended after age 17. ?Pneumococcal polysaccharide (PPSV23) vaccine. One dose is recommended after age 67. ?Talk to your health care provider about which screenings and vaccines you need  and how often you need them. ?This information is  not intended to replace advice given to you by your health care provider. Make sure you discuss any questions you have with your health care provider. ?Document Released: 02/03/2015 Document Revised: 09/27/2015 Document Reviewed: 11/08/2014 ?[employer]vier Interactive Patient Education ? 2017 [place]. ? ?Fall Prevention in the Home ?Falls can cause injuries. They can happen to people of all ages. There are many things you can do to make your home safe and to help prevent falls. ?What can I do on the outside of my home? ?Regularly fix the edges of walkways and driveways and fix any cracks. ?Remove anything that might make you trip as you walk through a door, such as a raised step or threshold. ?Trim any bushes or trees on the path to your home. ?Use bright outdoor lighting. ?Clear any walking paths of anything that might make someone trip, such as rocks or tools. ?Regularly check to see if handrails are loose or broken. Make sure that both sides of any steps have handrails. ?Any raised decks and porches should have guardrails on the edges. ?Have any leaves, snow, or ice cleared regularly. ?Use sand or salt on walking paths during winter. ?Clean up any spills in your garage right away. This includes oil or grease spills. ?What can I do in the bathroom? ?Use night lights. ?Install grab bars by the toilet and in the tub and shower. Do not use towel bars as grab bars. ?Use non-skid mats or decals in the tub or shower. ?If you need to sit down in the shower, use a plastic, non-slip stool. ?Keep the floor dry. Clean up any water that spills on the floor as soon as it happens. ?Remove soap buildup in the tub or shower regularly. ?Attach bath mats securely with double-sided non-slip rug tape. ?Do not have throw rugs and other things on the floor that can make you trip. ?What can I do in the bedroom? ?Use night lights. ?Make sure that you have a light by your bed that is easy to reach. ?Do not use any sheets or blankets that  are too big for your bed. They should not hang down onto the floor. ?Have a firm chair that has side arms. You can use this for support while you get dressed. ?Do not have throw rugs and other things on the floor that can make you trip. ?What can I do in the kitchen? ?Clean up any spills right away. ?Avoid walking on wet floors. ?Keep items that you use a lot in easy-to-reach places. ?If you need to reach something above you, use a strong step stool that has a grab bar. ?Keep electrical cords out of the way. ?Do not use floor polish or wax that makes floors slippery. If you must use wax, use non-skid floor wax. ?Do not have throw rugs and other things on the floor that can make you trip. ?What can I do with my stairs? ?Do not leave any items on the stairs. ?Make sure that there are handrails on both sides of the stairs and use them. Fix handrails that are broken or loose. Make sure that handrails are as long as the stairways. ?Check any carpeting to make sure that it is firmly attached to the stairs. Fix any carpet that is loose or worn. ?Avoid having throw rugs at the top or bottom of the stairs. If you do have throw rugs, attach them to the floor with carpet tape. ?Make sure that you  have a light switch at the top of the stairs and the bottom of the stairs. If you do not have them, ask someone to add them for you. ?What else can I do to help prevent falls? ?Wear shoes that: ?Do not have high heels. ?Have rubber bottoms. ?Are comfortable and fit you well. ?Are closed at the toe. Do not wear sandals. ?If you use a stepladder: ?Make sure that it is fully opened. Do not climb a closed stepladder. ?Make sure that both sides of the stepladder are locked into place. ?Ask someone to hold it for you, if possible. ?Clearly mark and make sure that you can see: ?Any grab bars or handrails. ?First and last steps. ?Where the edge of each step is. ?Use tools that help you move around (mobility aids) if they are needed. These  include: ?Canes. ?Walkers. ?Scooters. ?Crutches. ?Turn on the lights when you go into a dark area. Replace any light bulbs as soon as they burn out. ?Set up your furniture so you have a clear path. Avoid m

## 2021-06-14 DIAGNOSIS — H524 Presbyopia: Secondary | ICD-10-CM | POA: Diagnosis not present

## 2021-06-14 DIAGNOSIS — Z961 Presence of intraocular lens: Secondary | ICD-10-CM | POA: Diagnosis not present

## 2021-06-14 DIAGNOSIS — H40013 Open angle with borderline findings, low risk, bilateral: Secondary | ICD-10-CM | POA: Diagnosis not present

## 2021-06-14 DIAGNOSIS — H04123 Dry eye syndrome of bilateral lacrimal glands: Secondary | ICD-10-CM | POA: Diagnosis not present

## 2021-06-14 LAB — HM DIABETES EYE EXAM

## 2021-06-15 ENCOUNTER — Encounter: Payer: Self-pay | Admitting: Family Medicine

## 2021-06-26 DIAGNOSIS — I10 Essential (primary) hypertension: Secondary | ICD-10-CM | POA: Diagnosis not present

## 2021-06-26 DIAGNOSIS — Z6830 Body mass index (BMI) 30.0-30.9, adult: Secondary | ICD-10-CM | POA: Diagnosis not present

## 2021-06-26 DIAGNOSIS — Z808 Family history of malignant neoplasm of other organs or systems: Secondary | ICD-10-CM | POA: Diagnosis not present

## 2021-06-26 DIAGNOSIS — E785 Hyperlipidemia, unspecified: Secondary | ICD-10-CM | POA: Diagnosis not present

## 2021-06-26 DIAGNOSIS — Z801 Family history of malignant neoplasm of trachea, bronchus and lung: Secondary | ICD-10-CM | POA: Diagnosis not present

## 2021-06-26 DIAGNOSIS — Z7951 Long term (current) use of inhaled steroids: Secondary | ICD-10-CM | POA: Diagnosis not present

## 2021-06-26 DIAGNOSIS — E119 Type 2 diabetes mellitus without complications: Secondary | ICD-10-CM | POA: Diagnosis not present

## 2021-06-26 DIAGNOSIS — M109 Gout, unspecified: Secondary | ICD-10-CM | POA: Diagnosis not present

## 2021-06-26 DIAGNOSIS — M1990 Unspecified osteoarthritis, unspecified site: Secondary | ICD-10-CM | POA: Diagnosis not present

## 2021-06-26 DIAGNOSIS — Z812 Family history of tobacco abuse and dependence: Secondary | ICD-10-CM | POA: Diagnosis not present

## 2021-06-26 DIAGNOSIS — J45909 Unspecified asthma, uncomplicated: Secondary | ICD-10-CM | POA: Diagnosis not present

## 2021-06-26 DIAGNOSIS — Z008 Encounter for other general examination: Secondary | ICD-10-CM | POA: Diagnosis not present

## 2021-06-26 DIAGNOSIS — E669 Obesity, unspecified: Secondary | ICD-10-CM | POA: Diagnosis not present

## 2021-07-12 ENCOUNTER — Encounter: Payer: Self-pay | Admitting: Family Medicine

## 2021-07-12 ENCOUNTER — Ambulatory Visit (INDEPENDENT_AMBULATORY_CARE_PROVIDER_SITE_OTHER): Payer: Medicare HMO | Admitting: Family Medicine

## 2021-07-12 VITALS — BP 130/76 | HR 88 | Temp 97.9°F | Resp 18 | Ht 65.0 in | Wt 194.0 lb

## 2021-07-12 DIAGNOSIS — I499 Cardiac arrhythmia, unspecified: Secondary | ICD-10-CM

## 2021-07-12 DIAGNOSIS — R002 Palpitations: Secondary | ICD-10-CM | POA: Diagnosis not present

## 2021-07-12 DIAGNOSIS — I498 Other specified cardiac arrhythmias: Secondary | ICD-10-CM | POA: Diagnosis not present

## 2021-07-12 DIAGNOSIS — R5383 Other fatigue: Secondary | ICD-10-CM

## 2021-07-12 DIAGNOSIS — R0609 Other forms of dyspnea: Secondary | ICD-10-CM | POA: Diagnosis not present

## 2021-07-12 DIAGNOSIS — R0600 Dyspnea, unspecified: Secondary | ICD-10-CM

## 2021-07-12 NOTE — Progress Notes (Signed)
Subjective:  Patient ID: Sabrina Knight, female    DOB: 1950-09-11  Age: 71 y.o. MRN: [id number]  CC:  Chief Complaint  Patient presents with   Shortness of Breath    Pt here for assessment of allergies as she notes she has been getting SOB in the mornings and when working out especially with how hot it is where she works out, has been Paramedic for asthma, notes fatigue with the SOB at the time     HPI Sabrina Knight presents for   Dyspnea: History of mild intermittent asthma, allergies.  Dyspnea in the mornings with working out. Exercises 3 days per week for 2.5 hrs. Water aerobics.  Allergies worse past few months, warm air in [employer]. Harder to breathe, worse asthma control past month.using pumicort once per day, singulair at night. Albuterol only if needed. Only needed albuterol 3 times in past month.   Something different past 6 days. Woke up ok, went to [facility]. Tried to workout, but much worse dyspnea than usual. Not wheezing. Some chest tightness, more winded than usual. Heaviness sensation to shoulders/back. Cut workout short. Short of breath with walking at [facility]. No sweating/nausea. No fever. No new cough. Dry allergy cough same. No radiation to arms/neck.  No treatments. Having to rest more, more sleep past 6 days. No relief with trial of extra pulmicort or dose of albuterol.  No swelling or new cough. Blood pressures ok.  Funny feeling in chest, heartbeat feels different.  No fevers. No HA, bodyache, negative covid test 3 days ago and yesterday.   A little better past few days   Afib and CAD in family - brother with each.  No recent prolonged car travel or air travel, no recent calf pain or swelling.no recent surgery.   Wt Readings from Last 3 Encounters:  07/12/21 194 lb (88 kg)  02/07/21 194 lb (88 kg)  07/12/20 196 lb (88.9 kg)     History Patient Active Problem List   Diagnosis Date Noted   Degenerative disc disease, lumbar 07/12/2020   Degenerative arthritis  of left knee 05/26/2020   Greater trochanteric bursitis of both hips 05/26/2020   Mild persistent asthma, uncomplicated 78/29/5621   Vasomotor rhinitis 01/08/2019   Other allergic rhinitis 01/08/2019   Bilateral carpal tunnel syndrome 09/25/2018   Dyslipidemia 10/22/2017   Statin intolerance 10/22/2017   Newly diagnosed diabetes ([place]) 10/22/2017   Gout 12/23/2016   Class 1 obesity due to excess calories with serious comorbidity and body mass index (BMI) of 31.0 to 31.9 in adult 05/19/2016   Osteopenia of necks of both femurs 05/19/2016   Thrombocytosis 02/08/2014   High cholesterol 02/01/2013   H/O splenectomy 02/01/2013   Non-allergic rhinitis 02/01/2013   Glaucoma 02/01/2013   Past Medical History:  Diagnosis Date   Allergy    Zyrtec, Singulair; allergy testing negative; Orvil Feil.   Arthritis    Asthma due to seasonal allergies    Cataract    Clotting disorder ([place])    Glaucoma    Gout    ITP (idiopathic thrombocytopenic purpura)    Past Surgical History:  Procedure Laterality Date   ABDOMINAL HYSTERECTOMY  01/22/1999   ovaries resected; DUB/fibroids   CARPAL TUNNEL RELEASE Right 09/11/2018   Procedure: CARPAL TUNNEL RELEASE;  Surgeon: Daryll Brod, MD;  Location: [facility];  Service: Orthopedics;  Laterality: Right;   CARPAL TUNNEL RELEASE Left 10/02/2018   Procedure: CARPAL TUNNEL RELEASE;  Surgeon: Daryll Brod, MD;  Location: [facility];  Service: Orthopedics;  Laterality: Left;  FAB   EYE SURGERY N/A    [employer] 06/26/2019   spleen removal  01/22/1995   for ITP   spleen removal     Allergies  Allergen Reactions   Lisinopril Cough   Prior to Admission medications   Medication Sig Start Date End Date Taking? Authorizing Provider  allopurinol (ZYLOPRIM) 100 MG tablet Take 1 tablet (100 mg total) by mouth 2 (two) times daily. 02/07/21  Yes Wendie Agreste, MD  Biotin 1 MG CAPS Take by mouth.   Yes [provider]  cetirizine  (ZYRTEC) 10 MG tablet 1 tablet   Yes [provider]  co-enzyme Q-10 30 MG capsule Take by mouth.   Yes [provider]  cyanocobalamin 100 MCG tablet Take by mouth.   Yes [provider]  Javier Docker Oil 300 MG CAPS Take by mouth.   Yes [provider]  losartan (COZAAR) 50 MG tablet Take 0.5 tablets (25 mg total) by mouth daily. 02/07/21  Yes Wendie Agreste, MD  magnesium gluconate (MAGONATE) 500 MG tablet Take 500 mg by mouth 2 (two) times daily.   Yes [provider]  montelukast (SINGULAIR) 10 MG tablet Take 1 tablet (10 mg total) by mouth daily. 02/07/21  Yes Wendie Agreste, MD  Multiple Vitamin (MULTIVITAMIN WITH MINERALS) TABS tablet Take 1 tablet by mouth daily.   Yes [provider]  rosuvastatin (CRESTOR) 10 MG tablet TAKE 1 TAB BY MOUTH DAILY IN THE EVENING 5 DAYS PER WEEK. 02/07/21  Yes Wendie Agreste, MD  Tart Cherry 1200 MG CAPS Take by mouth.   Yes [provider]  VITAMIN D, CHOLECALCIFEROL, PO Take by mouth.   Yes [provider]  vitamin E 180 MG (400 UNITS) capsule Take 400 Units by mouth daily.   Yes [provider]  vitamin k 100 MCG tablet Take 100 mcg by mouth daily.   Yes [provider]  budesonide (PULMICORT) 0.5 MG/2ML nebulizer solution 1 mg.    [provider]  fexofenadine (ALLEGRA) 180 MG tablet TAKE 1 TABLET BY MOUTH EVERY DAY Patient not taking: Reported on 04/26/2021 10/28/16   [provider]   Social History   Socioeconomic History   Marital status: Widowed    Spouse name: Not on file   Number of children: 0   Years of education: Not on file   Highest education level: Not on file  Occupational History   Occupation: Retired  Tobacco Use   Smoking status: Former    Packs/day: 0.50    Types: Cigarettes    Quit date: 12/04/2003    Years since quitting: 17.6   Smokeless tobacco: Never  Vaping Use   Vaping Use: Never used  Substance and Sexual  Activity   Alcohol use: No    Alcohol/week: 0.0 standard drinks of alcohol   Drug use: No   Sexual activity: Not Currently    Birth control/protection: Surgical  Other Topics Concern   Not on file  Social History Narrative   Marital status:  Widowed x since 2001 from [place]; married x 27 years.  Not dating really in 2019.      Children:  None; failed in vitro.      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      Tobacco: quit ; smoked 20 years.      Alcohol: none  Education: [employer].       Exercise: Yes; 3 times per week 2 hours deep water aerobics.      ADLs: independent ADLs; no assistant devices.      Advanced Directives:  None; FULL CODE; no prolonged measures.    HCPOA: Darlene Jackson/sister-in-law.     Social Determinants of Health   Financial Resource Strain: Low Risk  (04/26/2021)   Overall Financial Resource Strain (CARDIA)    Difficulty of Paying Living Expenses: Not hard at all  Food Insecurity: No Food Insecurity (04/26/2021)   Hunger Vital Sign    Worried About Running Out of Food in the Last Year: Never true    Ran Out of Food in the Last Year: Never true  Transportation Needs: No Transportation Needs (04/26/2021)   PRAPARE - Hydrologist (Medical): No    Lack of Transportation (Non-Medical): No  Physical Activity: Sufficiently Active (04/26/2021)   Exercise Vital Sign    Days of Exercise per Week: 3 days    Minutes of Exercise per Session: 60 min  Stress: No Stress Concern Present (04/26/2021)   [place]    Feeling of Stress : Not at all  Social Connections: Moderately Isolated (04/26/2021)   Social Connection and Isolation Panel [NHANES]    Frequency of Communication with Friends and Family: Twice a week    Frequency of Social Gatherings with Friends and Family: Twice a week    Attends Religious Services: 1 to 4 times per year     Active Member of [employer] or Organizations: No    Attends Archivist Meetings: Never    Marital Status: Widowed  Intimate Partner Violence: Not At Risk (04/26/2021)   Humiliation, Afraid, Rape, and Kick questionnaire    Fear of Current or Ex-Partner: No    Emotionally Abused: No    Physically Abused: No    Sexually Abused: No    Review of Systems Per HPI.   Objective:   Vitals:   07/12/21 1538  BP: 130/76  Pulse: 88  Resp: 18  Temp: 97.9 F (36.6 C)  TempSrc: Temporal  SpO2: 92%  Weight: 194 lb (88 kg)  Height: '5\' 5"'$  (1.651 m)    Physical Exam Vitals reviewed.  Constitutional:      Appearance: Normal appearance. She is well-developed.  HENT:     Head: Normocephalic and atraumatic.  Eyes:     Conjunctiva/sclera: Conjunctivae normal.     Pupils: Pupils are equal, round, and reactive to light.  Neck:     Vascular: No carotid bruit.  Cardiovascular:     Rate and Rhythm: Normal rate and regular rhythm.     Heart sounds: Normal heart sounds.  Pulmonary:     Effort: Pulmonary effort is normal.     Breath sounds: Normal breath sounds.  Abdominal:     Palpations: Abdomen is soft. There is no pulsatile mass.     Tenderness: There is no abdominal tenderness.  Musculoskeletal:     Right lower leg: No edema.     Left lower leg: No edema.  Skin:    General: Skin is warm and dry.  Neurological:     Mental Status: She is alert and oriented to person, place, and time.  Psychiatric:        Mood and Affect: Mood normal.        Behavior: Behavior normal.      EKG, sinus rhythm with frequent  PVCs, ventricular bigeminy.  Rate 92.  Left bundle branch block.  Left bundle branch block also seen on EKG on 03/28/2015.  Ventricular bigeminy/frequent PVCs new.  Assessment & Plan:  DAYLEEN BESKE is a 71 y.o. female . Dyspnea, unspecified type - Plan: Brain natriuretic peptide, Magnesium, CANCELED: Pro b natriuretic peptide  DOE (dyspnea on exertion) - Plan: Basic  metabolic panel, Brain natriuretic peptide, Magnesium, CANCELED: Pro b natriuretic peptide  Palpitations - Plan: TSH, CBC, Basic metabolic panel, EKG 23-FTDD, Brain natriuretic peptide, Magnesium, CANCELED: Pro b natriuretic peptide  Irregular heart rate - Plan: Basic metabolic panel, EKG 22-GURK  Ventricular bigeminy - Plan: Magnesium  Fatigue, unspecified type  History of asthma with some increased symptoms past month with allergies, then acute change in dyspnea, fatigue with some palpitations, chest sensation starting 6 days ago.  Still with some persistent dyspnea on exertion, persistent fatigue.  Denies chest pain.  Minimal improvement in symptoms past few days but still fatigued throughout the day.  Ventricular bigeminy on EKG, appears new compared to previous EKG.  Likely cause.  Does not appear fluid overloaded on exam with stable weight.  Blood pressure and heart rate overall stable.  Normal oxygenation. 5:38 PM Call placed to cardiology on call to discuss symptoms and disposition. Discussed with cardiologist on-call.  Will arrange for an office evaluation in the next few days.  Labs obtained as above including BMP, CBC, TSH, magnesium.  PVCs discussed with patient, ER precautions were discussed but clinically stable right now.  55 minutes spent during visit, including chart review, counseling and assimilation of information, exam, discussion of EKG and patient's symptoms with cardiologist on-call, further discussion with patient, discussion of plan, and chart completion.      No orders of the defined types were placed in this encounter.  Patient Instructions  EKG does show frequent premature ventricular contractions, at times in bigeminy with is just a term for multiple PVCs in a row.  I spoke with cardiology.  They should be giving you a call for an office visit either tomorrow or early next week to do some further testing and discussion of these symptoms.  I did check some blood  work today to make sure there are no other underlying concerns.  I do not think you need to go to the emergency room now or the weekend unless you have new or worsening symptoms.  Let us know if there are questions and take care.  Return to the clinic or go to the nearest emergency room if any of your symptoms worsen or new symptoms occur.  Premature Ventricular Contraction  A premature ventricular contraction (PVC) is a common kind of irregular heartbeat (arrhythmia). These contractions are extra heartbeats that start in the ventricles of the heart and occur too early in the normal sequence. During the PVC, the heart's normal electrical pathway is not used, so the beat is shorter and less effective. In most cases, these contractions come and go and do not require treatment. What are the causes? Common causes of the condition include: Smoking. Drinking alcohol. Certain medicines. Some illegal drugs. Stress. Caffeine. Certain medical conditions can also cause PVCs: Heart failure. Heart attack, or coronary artery disease. Heart valve problems. Changes in minerals in the blood (electrolytes). Low blood oxygen levels or high carbon dioxide levels. In many cases, the cause of this condition is not known. What are the signs or symptoms? The main symptom of this condition is fast or skipped heartbeats (palpitations). Other symptoms  include: Chest pain. Shortness of breath. Feeling tired. Dizziness. Difficulty exercising. In some cases, there are no symptoms. How is this diagnosed? This condition may be diagnosed based on: Your medical history. A physical exam. During the exam, the health care provider will check for irregular heartbeats. Tests, such as: An ECG (electrocardiogram) to monitor the electrical activity of your heart. An ambulatory cardiac monitor. This device records your heartbeats for 24 hours or more. Stress tests to see how exercise affects your heart rhythm and blood  supply. An echocardiogram. This test uses sound waves (ultrasound) to produce an image of your heart. An electrophysiology study (EPS). This test checks for electrical problems in your heart. How is this treated? Treatment for this condition depends on any underlying conditions, the type of PVCs that you are having, and how much the symptoms are interfering with your daily life. Possible treatments include: Avoiding things that cause premature contractions (triggers). These include caffeine and alcohol. Taking medicines if symptoms are severe or if the extra heartbeats are frequent. Getting treatment for underlying conditions that cause PVCs. Having an implantable cardioverter defibrillator (ICD), if you are at risk for a serious arrhythmia. The ICD is a small device that is inserted into your chest to monitor your heartbeat. When it senses an irregular heartbeat, it sends a shock to bring the heartbeat back to normal. Having a procedure to destroy the portion of the heart tissue that sends out abnormal signals (catheter ablation). In some cases, no treatment is required. Follow these instructions at home: Lifestyle Do not use any products that contain nicotine or tobacco, such as cigarettes, e-cigarettes, and chewing tobacco. If you need help quitting, ask your health care provider. Do not use illegal drugs. Exercise regularly. Ask your health care provider what type of exercise is safe for you. Try to get at least 7-9 hours of sleep each night, or as much as recommended by your health care provider. Find healthy ways to manage stress. Avoid stressful situations when possible. Alcohol use Do not drink alcohol if: Your health care provider tells you not to drink. You are pregnant, may be pregnant, or are planning to become pregnant. Alcohol triggers your episodes. If you drink alcohol: Limit how much you use to: 0-1 drink a day for women. 0-2 drinks a day for men. Be aware of how much  alcohol is in your drink. In the [place] one drink equals one 12 oz bottle of beer (355 mL), one 5 oz glass of wine (148 mL), or one 1 oz glass of hard liquor (44 mL). General instructions Take over-the-counter and prescription medicines only as told by your health care provider. If caffeine triggers episodes of PVC, do not eat, drink, or use anything with caffeine in it. Keep all follow-up visits as told by your health care provider. This is important. Contact a health care provider if you: Feel palpitations. Get help right away if you: Have chest pain. Have shortness of breath. Have sweating for no reason. Have nausea and vomiting. Become light-headed or you faint. Summary A premature ventricular contraction (PVC) is a common kind of irregular heartbeat (arrhythmia). In most cases, these contractions come and go and do not require treatment. You may need to wear an ambulatory cardiac monitor. This records your heartbeats for 24 hours or more. Treatment depends on any underlying conditions, the type of PVCs that you are having, and how much the symptoms are interfering with your daily life. This information is not intended to replace advice  given to you by your health care provider. Make sure you discuss any questions you have with your health care provider. Document Revised: 06/06/2020 Document Reviewed: 06/06/2020 Elsevier Patient Education  [place],   Merri Ray, MD [place] 07/12/21 5:38 PM

## 2021-07-12 NOTE — Patient Instructions (Signed)
EKG does show frequent premature ventricular contractions, at times in bigeminy with is just a term for multiple PVCs in a row.  I spoke with cardiology.  They should be giving you a call for an office visit either tomorrow or early next week to do some further testing and discussion of these symptoms.  I did check some blood work today to make sure there are no other underlying concerns.  I do not think you need to go to the emergency room now or the weekend unless you have new or worsening symptoms.  Let us know if there are questions and take care.  Return to the clinic or go to the nearest emergency room if any of your symptoms worsen or new symptoms occur.  Premature Ventricular Contraction  A premature ventricular contraction (PVC) is a common kind of irregular heartbeat (arrhythmia). These contractions are extra heartbeats that start in the ventricles of the heart and occur too early in the normal sequence. During the PVC, the heart's normal electrical pathway is not used, so the beat is shorter and less effective. In most cases, these contractions come and go and do not require treatment. What are the causes? Common causes of the condition include: Smoking. Drinking alcohol. Certain medicines. Some illegal drugs. Stress. Caffeine. Certain medical conditions can also cause PVCs: Heart failure. Heart attack, or coronary artery disease. Heart valve problems. Changes in minerals in the blood (electrolytes). Low blood oxygen levels or high carbon dioxide levels. In many cases, the cause of this condition is not known. What are the signs or symptoms? The main symptom of this condition is fast or skipped heartbeats (palpitations). Other symptoms include: Chest pain. Shortness of breath. Feeling tired. Dizziness. Difficulty exercising. In some cases, there are no symptoms. How is this diagnosed? This condition may be diagnosed based on: Your medical history. A physical exam. During  the exam, the health care provider will check for irregular heartbeats. Tests, such as: An ECG (electrocardiogram) to monitor the electrical activity of your heart. An ambulatory cardiac monitor. This device records your heartbeats for 24 hours or more. Stress tests to see how exercise affects your heart rhythm and blood supply. An echocardiogram. This test uses sound waves (ultrasound) to produce an image of your heart. An electrophysiology study (EPS). This test checks for electrical problems in your heart. How is this treated? Treatment for this condition depends on any underlying conditions, the type of PVCs that you are having, and how much the symptoms are interfering with your daily life. Possible treatments include: Avoiding things that cause premature contractions (triggers). These include caffeine and alcohol. Taking medicines if symptoms are severe or if the extra heartbeats are frequent. Getting treatment for underlying conditions that cause PVCs. Having an implantable cardioverter defibrillator (ICD), if you are at risk for a serious arrhythmia. The ICD is a small device that is inserted into your chest to monitor your heartbeat. When it senses an irregular heartbeat, it sends a shock to bring the heartbeat back to normal. Having a procedure to destroy the portion of the heart tissue that sends out abnormal signals (catheter ablation). In some cases, no treatment is required. Follow these instructions at home: Lifestyle Do not use any products that contain nicotine or tobacco, such as cigarettes, e-cigarettes, and chewing tobacco. If you need help quitting, ask your health care provider. Do not use illegal drugs. Exercise regularly. Ask your health care provider what type of exercise is safe for you. Try to get at  least 7-9 hours of sleep each night, or as much as recommended by your health care provider. Find healthy ways to manage stress. Avoid stressful situations when  possible. Alcohol use Do not drink alcohol if: Your health care provider tells you not to drink. You are pregnant, may be pregnant, or are planning to become pregnant. Alcohol triggers your episodes. If you drink alcohol: Limit how much you use to: 0-1 drink a day for women. 0-2 drinks a day for men. Be aware of how much alcohol is in your drink. In the [place] one drink equals one 12 oz bottle of beer (355 mL), one 5 oz glass of wine (148 mL), or one 1 oz glass of hard liquor (44 mL). General instructions Take over-the-counter and prescription medicines only as told by your health care provider. If caffeine triggers episodes of PVC, do not eat, drink, or use anything with caffeine in it. Keep all follow-up visits as told by your health care provider. This is important. Contact a health care provider if you: Feel palpitations. Get help right away if you: Have chest pain. Have shortness of breath. Have sweating for no reason. Have nausea and vomiting. Become light-headed or you faint. Summary A premature ventricular contraction (PVC) is a common kind of irregular heartbeat (arrhythmia). In most cases, these contractions come and go and do not require treatment. You may need to wear an ambulatory cardiac monitor. This records your heartbeats for 24 hours or more. Treatment depends on any underlying conditions, the type of PVCs that you are having, and how much the symptoms are interfering with your daily life. This information is not intended to replace advice given to you by your health care provider. Make sure you discuss any questions you have with your health care provider. Document Revised: 06/06/2020 Document Reviewed: 06/06/2020 Elsevier Patient Education  [place].

## 2021-07-13 LAB — BASIC METABOLIC PANEL
BUN: 23 mg/dL (ref 6–23)
CO2: 25 mEq/L (ref 19–32)
Calcium: 9.7 mg/dL (ref 8.4–10.5)
Chloride: 105 mEq/L (ref 96–112)
Creatinine, Ser: 1.07 mg/dL (ref 0.40–1.20)
GFR: 52.06 mL/min — ABNORMAL LOW (ref >60.00)
Glucose, Bld: 99 mg/dL (ref 70–99)
Potassium: 4 mEq/L (ref 3.5–5.1)
Sodium: 142 mEq/L (ref 135–145)

## 2021-07-13 LAB — CBC
HCT: 45.2 % (ref 36.0–46.0)
Hemoglobin: 14.6 g/dL (ref 12.0–15.0)
MCHC: 32.3 g/dL (ref 30.0–36.0)
MCV: 94 fl (ref 78.0–100.0)
Platelets: 386 10*3/uL (ref 150.0–400.0)
RBC: 4.81 Mil/uL (ref 3.87–5.11)
RDW: 14.3 % (ref 11.5–15.5)
WBC: 13.6 10*3/uL — ABNORMAL HIGH (ref 4.0–10.5)

## 2021-07-13 LAB — BRAIN NATRIURETIC PEPTIDE: Pro B Natriuretic peptide (BNP): 99 pg/mL (ref 0.0–100.0)

## 2021-07-13 LAB — MAGNESIUM: Magnesium: 2 mg/dL (ref 1.5–2.5)

## 2021-07-13 LAB — TSH: TSH: 1.78 u[IU]/mL (ref 0.35–5.50)

## 2021-07-18 ENCOUNTER — Ambulatory Visit (INDEPENDENT_AMBULATORY_CARE_PROVIDER_SITE_OTHER): Payer: Medicare HMO

## 2021-07-18 ENCOUNTER — Ambulatory Visit (HOSPITAL_BASED_OUTPATIENT_CLINIC_OR_DEPARTMENT_OTHER): Payer: Medicare HMO | Admitting: Cardiology

## 2021-07-18 ENCOUNTER — Ambulatory Visit: Payer: Medicare HMO | Admitting: Cardiology

## 2021-07-18 ENCOUNTER — Other Ambulatory Visit: Payer: Self-pay

## 2021-07-18 DIAGNOSIS — I499 Cardiac arrhythmia, unspecified: Secondary | ICD-10-CM

## 2021-07-18 DIAGNOSIS — R072 Precordial pain: Secondary | ICD-10-CM

## 2021-07-18 DIAGNOSIS — I493 Ventricular premature depolarization: Secondary | ICD-10-CM

## 2021-07-18 DIAGNOSIS — E7800 Pure hypercholesterolemia, unspecified: Secondary | ICD-10-CM | POA: Diagnosis not present

## 2021-07-18 DIAGNOSIS — R7989 Other specified abnormal findings of blood chemistry: Secondary | ICD-10-CM

## 2021-07-18 DIAGNOSIS — E1129 Type 2 diabetes mellitus with other diabetic kidney complication: Secondary | ICD-10-CM

## 2021-07-18 DIAGNOSIS — R0609 Other forms of dyspnea: Secondary | ICD-10-CM

## 2021-07-18 NOTE — Progress Notes (Unsigned)
Enrolled for [employer] to mail a ZIO XT long term holter monitor to the patients address on file.  

## 2021-07-18 NOTE — Progress Notes (Signed)
Cardiology Office Note:    Date:  07/18/2021   ID:  Sabrina Knight, DOB 03/28/1950, MRN [id number]  PCP:  Greene, Jeffrey R, MD   [facility] Providers Cardiologist:  None     Referring MD: Greene, Jeffrey R, MD    History of Present Illness:    Sabrina Knight is a 71 y.o. female here for the evaluation of shortness of breath, chest pain, ventricular bigeminy on ECG with left bundle branch block underlying.  I discussed the case with Dr. Greene referring provider.  Has been noticing more shortness of breath when trying to exercise.  Usually she does water aerobics at [facility] [employer].  Has both asthma as well as allergies as well and uses Pulmicort.  It was described that something had been different over the past few days however.  She felt tired to work out much more dyspnea she also felt chest tightness more windedness heaviness in the shoulders and back.  She felt as though she needed to cut her workout short.  She even felt shortness of breath when walking at [employer].  Denied any fevers chills nausea vomiting syncope.  No real radiation to the arms and neck.  Felt more fatigue in general.  Did not really see any change with albuterol or Pulmicort.  Had a negative COVID test approximately 1 week ago  There is coronary disease history/CABG in one of her brothers and another with atrial fibrillation as well.  No recent prolonged car or air travel.  No thrombotic history.  She has had trouble with statin intolerance in the past.  Has had lisinopril cough.  Currently taking Crestor 10 mg 5 days/week.  Tolerating.  Used to smoke quit in 2005.  Past Medical History:  Diagnosis Date   Allergy    Zyrtec, Singulair; allergy testing negative; [place].   Arthritis    Asthma due to seasonal allergies    Cataract    Clotting disorder (HCC)    Glaucoma    Gout    ITP (idiopathic thrombocytopenic purpura)     Past Surgical History:  Procedure Laterality Date   ABDOMINAL HYSTERECTOMY  01/22/1999    ovaries resected; DUB/fibroids   CARPAL TUNNEL RELEASE Right 09/11/2018   Procedure: CARPAL TUNNEL RELEASE;  Surgeon: Kuzma, Gary, MD;  Location: [facility];  Service: Orthopedics;  Laterality: Right;   CARPAL TUNNEL RELEASE Left 10/02/2018   Procedure: CARPAL TUNNEL RELEASE;  Surgeon: Kuzma, Gary, MD;  Location: [facility];  Service: Orthopedics;  Laterality: Left;  FAB   EYE SURGERY N/A    Phreesia 06/26/2019   spleen removal  01/22/1995   for ITP   spleen removal      Current Medications: Current Meds  Medication Sig   allopurinol (ZYLOPRIM) 100 MG tablet Take 1 tablet (100 mg total) by mouth 2 (two) times daily.   Biotin 1 MG CAPS Take by mouth.   cetirizine (ZYRTEC) 10 MG tablet 1 tablet   co-enzyme Q-10 30 MG capsule Take by mouth.   cyanocobalamin 100 MCG tablet Take by mouth.   Krill Oil 300 MG CAPS Take by mouth.   losartan (COZAAR) 50 MG tablet Take 0.5 tablets (25 mg total) by mouth daily.   magnesium gluconate (MAGONATE) 500 MG tablet Take 500 mg by mouth 2 (two) times daily.   montelukast (SINGULAIR) 10 MG tablet Take 1 tablet (10 mg total) by mouth daily.   Multiple Vitamin (MULTIVITAMIN WITH MINERALS) TABS tablet Take 1 tablet by mouth   daily.   rosuvastatin (CRESTOR) 10 MG tablet TAKE 1 TAB BY MOUTH DAILY IN THE EVENING 5 DAYS PER WEEK.   SYMBICORT 80-4.5 MCG/ACT inhaler Inhale 2 puffs into the lungs as needed.   Tart Cherry 1200 MG CAPS Take by mouth.   VITAMIN D, CHOLECALCIFEROL, PO Take by mouth.   vitamin E 180 MG (400 UNITS) capsule Take 400 Units by mouth daily.   vitamin k 100 MCG tablet Take 100 mcg by mouth daily.     Allergies:   Lisinopril   Social History   Socioeconomic History   Marital status: Widowed    Spouse name: Not on file   Number of children: 0   Years of education: Not on file   Highest education level: Not on file  Occupational History   Occupation: Retired  Tobacco Use   Smoking status: Former     Packs/day: 0.50    Types: Cigarettes    Quit date: 12/04/2003    Years since quitting: 17.6   Smokeless tobacco: Never  Vaping Use   Vaping Use: Never used  Substance and Sexual Activity   Alcohol use: No    Alcohol/week: 0.0 standard drinks of alcohol   Drug use: No   Sexual activity: Not Currently    Birth control/protection: Surgical  Other Topics Concern   Not on file  Social History Narrative   Marital status:  Widowed x since 2001 from melanoma; married x 27 years.  Not dating really in 2019.      Children:  None; failed in vitro.      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      Tobacco: quit ; smoked 20 years.      Alcohol: none      Education: High School.       Exercise: Yes; 3 times per week 2 hours deep water aerobics.      ADLs: independent ADLs; no assistant devices.      Advanced Directives:  None; FULL CODE; no prolonged measures.    HCPOA: Darlene Jackson/sister-in-law.     Social Determinants of Health   Financial Resource Strain: Low Risk  (04/26/2021)   Overall Financial Resource Strain (CARDIA)    Difficulty of Paying Living Expenses: Not hard at all  Food Insecurity: No Food Insecurity (04/26/2021)   Hunger Vital Sign    Worried About Running Out of Food in the Last Year: Never true    Ran Out of Food in the Last Year: Never true  Transportation Needs: No Transportation Needs (04/26/2021)   PRAPARE - Transportation    Lack of Transportation (Medical): No    Lack of Transportation (Non-Medical): No  Physical Activity: Sufficiently Active (04/26/2021)   Exercise Vital Sign    Days of Exercise per Week: 3 days    Minutes of Exercise per Session: 60 min  Stress: No Stress Concern Present (04/26/2021)   [employer] - Occupational Stress Questionnaire    Feeling of Stress : Not at all  Social Connections: Moderately Isolated (04/26/2021)   Social Connection and Isolation Panel [NHANES]     Frequency of Communication with Friends and Family: Twice a week    Frequency of Social Gatherings with Friends and Family: Twice a week    Attends Religious Services: 1 to 4 times per year    Active Member of Clubs or Organizations: No    Attends Club or Organization Meetings: Never      Marital Status: Widowed     Family History: The patient's family history includes COPD in her father; Cancer (age of onset: 65) in her brother; Cancer (age of onset: 75) in her mother; Diabetes in her brother, brother, brother, and sister; Heart disease (age of onset: 63) in her brother; Hyperlipidemia in her mother; Hypertension in her brother, brother, brother, brother, mother, sister, and sister; Mental illness in her mother.  ROS:   Please see the history of present illness.     All other systems reviewed and are negative.  EKGs/Labs/Other Studies Reviewed:    The following studies were reviewed today: Prior office records reviewed  EKG: Prior EKG as above.  Today's EKG shows sinus rhythm left bundle branch block 78 bpm with ventricular bigeminy PVCs frequent.  Recent Labs: 02/07/2021: ALT 32 07/12/2021: BUN 23; Creatinine, Ser 1.07; Hemoglobin 14.6; Magnesium 2.0; Platelets 386.0; Potassium 4.0; Pro B Natriuretic peptide (BNP) 99.0; Sodium 142; TSH 1.78  Recent Lipid Panel    Component Value Date/Time   CHOL 156 02/07/2021 1125   CHOL 204 (H) 12/29/2019 1147   TRIG 185.0 (H) 02/07/2021 1125   HDL 48.40 02/07/2021 1125   HDL 52 12/29/2019 1147   CHOLHDL 3 02/07/2021 1125   VLDL 37.0 02/07/2021 1125   LDLCALC 71 02/07/2021 1125   LDLCALC 120 (H) 12/29/2019 1147   LDLDIRECT 93.0 06/29/2020 1619     Risk Assessment/Calculations:              Physical Exam:    VS:  BP 128/82 (BP Location: Right Arm, Patient Position: Sitting, Cuff Size: Normal)   Pulse 78   Ht 5' 5" (1.651 m)   Wt 194 lb 14.4 oz (88.4 kg)   BMI 32.43 kg/m     Wt Readings from Last 3 Encounters:  07/18/21 194 lb  14.4 oz (88.4 kg)  07/12/21 194 lb (88 kg)  02/07/21 194 lb (88 kg)     GEN:  Well nourished, well developed in no acute distress HEENT: Normal NECK: No JVD; No carotid bruits LYMPHATICS: No lymphadenopathy CARDIAC: RRR with frequent ectopy, no murmurs, no rubs, gallops RESPIRATORY:  Clear to auscultation without rales, wheezing or rhonchi  ABDOMEN: Soft, non-tender, non-distended MUSCULOSKELETAL:  No edema; No deformity  SKIN: Warm and dry NEUROLOGIC:  Alert and oriented x 3 PSYCHIATRIC:  Normal affect   ASSESSMENT:    1. Precordial chest pain   2. PVC's (premature ventricular contractions)   3. High cholesterol    PLAN:    In order of problems listed above:  Precordial chest pain With her shortness of breath, chest pain, former smoking history, brother with bypass surgery, new onset increased dyspnea/fatigue we will go ahead and proceed with left heart catheterization.  Risks and benefits of been explained including stroke heart attack death renal impairment bleeding.  She is willing to proceed.  Her friend Brenda is with her.  We will check an echocardiogram to ensure proper structure and function of the heart.  With frequent PVCs, occasionally cardiomyopathy is underlying.  Recent lab work is unremarkable.  PVC's (premature ventricular contractions) Ventricular bigeminy, frequent PVCs noted on ECG.  Could be contributing to her symptoms.  We will check a ZIO monitor to quantify.  If PVC burden is high enough, consider EP evaluation for either ablation or antiarrhythmic therapy.  High cholesterol LDL currently 71 on every other day Crestor 10 mg.  Had trouble with higher dosing.         Medication Adjustments/Labs and   Tests Ordered: Current medicines are reviewed at length with the patient today.  Concerns regarding medicines are outlined above.  No orders of the defined types were placed in this encounter.  No orders of the defined types were placed in this  encounter.   There are no Patient Instructions on file for this visit.   Signed, Aaric Dolph, MD  07/18/2021 9:02 AM    [facility] 

## 2021-07-18 NOTE — Assessment & Plan Note (Signed)
LDL currently 71 on every other day Crestor 10 mg.  Had trouble with higher dosing.

## 2021-07-18 NOTE — Assessment & Plan Note (Addendum)
With her shortness of breath, chest pain, former smoking history, brother with bypass surgery, new onset increased dyspnea/fatigue we will go ahead and proceed with left heart catheterization.  Risks and benefits of been explained including stroke heart attack death renal impairment bleeding.  She is willing to proceed.  Her friend Hassan Rowan is with her.  We will check an echocardiogram to ensure proper structure and function of the heart.  With frequent PVCs, occasionally cardiomyopathy is underlying.  Recent lab work is unremarkable.

## 2021-07-18 NOTE — Assessment & Plan Note (Signed)
Ventricular bigeminy, frequent PVCs noted on ECG.  Could be contributing to her symptoms.  We will check a ZIO monitor to quantify.  If PVC burden is high enough, consider EP evaluation for either ablation or antiarrhythmic therapy.

## 2021-07-18 NOTE — H&P (View-Only) (Signed)
Cardiology Office Note:    Date:  07/18/2021   ID:  Sabrina Knight, DOB [birth year], MRN [id number]  PCP:  Wendie Agreste, MD   [facility] Providers Cardiologist:  None     Referring MD: Wendie Agreste, MD    History of Present Illness:    Sabrina Knight is a 71 y.o. female here for the evaluation of shortness of breath, chest pain, ventricular bigeminy on ECG with left bundle branch block underlying.  I discussed the case with Dr. Carlota Raspberry referring provider.  Has been noticing more shortness of breath when trying to exercise.  Usually she does water aerobics at the [facility].  Has both asthma as well as allergies as well and uses Pulmicort.  It was described that something had been different over the past few days however.  She felt tired to work out much more dyspnea she also felt chest tightness more windedness heaviness in the shoulders and back.  She felt as though she needed to cut her workout short.  She even felt shortness of breath when walking at [place].  Denied any fevers chills nausea vomiting syncope.  No real radiation to the arms and neck.  Felt more fatigue in general.  Did not really see any change with albuterol or Pulmicort.  Had a negative COVID test approximately 1 week ago  There is coronary disease history/CABG in one of her brothers and another with atrial fibrillation as well.  No recent prolonged car or air travel.  No thrombotic history.  She has had trouble with statin intolerance in the past.  Has had lisinopril cough.  Currently taking Crestor 10 mg 5 days/week.  Tolerating.  Used to smoke quit in 2005.  Past Medical History:  Diagnosis Date   Allergy    Zyrtec, Singulair; allergy testing negative; [place].   Arthritis    Asthma due to seasonal allergies    Cataract    Clotting disorder ([place])    Glaucoma    Gout    ITP (idiopathic thrombocytopenic purpura)     Past Surgical History:  Procedure Laterality Date   ABDOMINAL HYSTERECTOMY  01/22/1999    ovaries resected; DUB/fibroids   CARPAL TUNNEL RELEASE Right 09/11/2018   Procedure: CARPAL TUNNEL RELEASE;  Surgeon: Daryll Brod, MD;  Location: [facility];  Service: Orthopedics;  Laterality: Right;   CARPAL TUNNEL RELEASE Left 10/02/2018   Procedure: CARPAL TUNNEL RELEASE;  Surgeon: Daryll Brod, MD;  Location: [facility];  Service: Orthopedics;  Laterality: Left;  FAB   EYE SURGERY N/A    Phreesia 06/26/2019   spleen removal  01/22/1995   for ITP   spleen removal      Current Medications: Current Meds  Medication Sig   allopurinol (ZYLOPRIM) 100 MG tablet Take 1 tablet (100 mg total) by mouth 2 (two) times daily.   Biotin 1 MG CAPS Take by mouth.   cetirizine (ZYRTEC) 10 MG tablet 1 tablet   co-enzyme Q-10 30 MG capsule Take by mouth.   cyanocobalamin 100 MCG tablet Take by mouth.   Krill Oil 300 MG CAPS Take by mouth.   losartan (COZAAR) 50 MG tablet Take 0.5 tablets (25 mg total) by mouth daily.   magnesium gluconate (MAGONATE) 500 MG tablet Take 500 mg by mouth 2 (two) times daily.   montelukast (SINGULAIR) 10 MG tablet Take 1 tablet (10 mg total) by mouth daily.   Multiple Vitamin (MULTIVITAMIN WITH MINERALS) TABS tablet Take 1 tablet by mouth  daily.   rosuvastatin (CRESTOR) 10 MG tablet TAKE 1 TAB BY MOUTH DAILY IN THE EVENING 5 DAYS PER WEEK.   SYMBICORT 80-4.5 MCG/ACT inhaler Inhale 2 puffs into the lungs as needed.   Tart Cherry 1200 MG CAPS Take by mouth.   VITAMIN D, CHOLECALCIFEROL, PO Take by mouth.   vitamin E 180 MG (400 UNITS) capsule Take 400 Units by mouth daily.   vitamin k 100 MCG tablet Take 100 mcg by mouth daily.     Allergies:   Lisinopril   Social History   Socioeconomic History   Marital status: Widowed    Spouse name: Not on file   Number of children: 0   Years of education: Not on file   Highest education level: Not on file  Occupational History   Occupation: Retired  Tobacco Use   Smoking status: Former     Packs/day: 0.50    Types: Cigarettes    Quit date: 12/04/2003    Years since quitting: 17.6   Smokeless tobacco: Never  Vaping Use   Vaping Use: Never used  Substance and Sexual Activity   Alcohol use: No    Alcohol/week: 0.0 standard drinks of alcohol   Drug use: No   Sexual activity: Not Currently    Birth control/protection: Surgical  Other Topics Concern   Not on file  Social History Narrative   Marital status:  Widowed x since 2001 from [place]; married x 27 years.  Not dating really in 2019.      Children:  None; failed in vitro.      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      Tobacco: quit ; smoked 20 years.      Alcohol: none      Education: [employer].       Exercise: Yes; 3 times per week 2 hours deep water aerobics.      ADLs: independent ADLs; no assistant devices.      Advanced Directives:  None; FULL CODE; no prolonged measures.    HCPOA: Darlene Jackson/sister-in-law.     Social Determinants of Health   Financial Resource Strain: Low Risk  (04/26/2021)   Overall Financial Resource Strain (CARDIA)    Difficulty of Paying Living Expenses: Not hard at all  Food Insecurity: No Food Insecurity (04/26/2021)   Hunger Vital Sign    Worried About Running Out of Food in the Last Year: Never true    Ran Out of Food in the Last Year: Never true  Transportation Needs: No Transportation Needs (04/26/2021)   PRAPARE - Hydrologist (Medical): No    Lack of Transportation (Non-Medical): No  Physical Activity: Sufficiently Active (04/26/2021)   Exercise Vital Sign    Days of Exercise per Week: 3 days    Minutes of Exercise per Session: 60 min  Stress: No Stress Concern Present (04/26/2021)   [place]    Feeling of Stress : Not at all  Social Connections: Moderately Isolated (04/26/2021)   Social Connection and Isolation Panel [NHANES]     Frequency of Communication with Friends and Family: Twice a week    Frequency of Social Gatherings with Friends and Family: Twice a week    Attends Religious Services: 1 to 4 times per year    Active Member of [employer] or Organizations: No    Attends Archivist Meetings: Never  Marital Status: Widowed     Family History: The patient's family history includes COPD in her father; Cancer (age of onset: 90) in her brother; Cancer (age of onset: 73) in her mother; Diabetes in her brother, brother, brother, and sister; Heart disease (age of onset: 90) in her brother; Hyperlipidemia in her mother; Hypertension in her brother, brother, brother, brother, mother, sister, and sister; Mental illness in her mother.  ROS:   Please see the history of present illness.     All other systems reviewed and are negative.  EKGs/Labs/Other Studies Reviewed:    The following studies were reviewed today: Prior office records reviewed  EKG: Prior EKG as above.  Today's EKG shows sinus rhythm left bundle branch block 78 bpm with ventricular bigeminy PVCs frequent.  Recent Labs: 02/07/2021: ALT 32 07/12/2021: BUN 23; Creatinine, Ser 1.07; Hemoglobin 14.6; Magnesium 2.0; Platelets 386.0; Potassium 4.0; Pro B Natriuretic peptide (BNP) 99.0; Sodium 142; TSH 1.78  Recent Lipid Panel    Component Value Date/Time   CHOL 156 02/07/2021 1125   CHOL 204 (H) 12/29/2019 1147   TRIG 185.0 (H) 02/07/2021 1125   HDL 48.40 02/07/2021 1125   HDL 52 12/29/2019 1147   CHOLHDL 3 02/07/2021 1125   VLDL 37.0 02/07/2021 1125   LDLCALC 71 02/07/2021 1125   LDLCALC 120 (H) 12/29/2019 1147   LDLDIRECT 93.0 06/29/2020 1619     Risk Assessment/Calculations:              Physical Exam:    VS:  BP 128/82 (BP Location: Right Arm, Patient Position: Sitting, Cuff Size: Normal)   Pulse 78   Ht '5\' 5"'$  (1.651 m)   Wt 194 lb 14.4 oz (88.4 kg)   BMI 32.43 kg/m     Wt Readings from Last 3 Encounters:  07/18/21 194 lb  14.4 oz (88.4 kg)  07/12/21 194 lb (88 kg)  02/07/21 194 lb (88 kg)     GEN:  Well nourished, well developed in no acute distress HEENT: Normal NECK: No JVD; No carotid bruits LYMPHATICS: No lymphadenopathy CARDIAC: RRR with frequent ectopy, no murmurs, no rubs, gallops RESPIRATORY:  Clear to auscultation without rales, wheezing or rhonchi  ABDOMEN: Soft, non-tender, non-distended MUSCULOSKELETAL:  No edema; No deformity  SKIN: Warm and dry NEUROLOGIC:  Alert and oriented x 3 PSYCHIATRIC:  Normal affect   ASSESSMENT:    1. Precordial chest pain   2. PVC's (premature ventricular contractions)   3. High cholesterol    PLAN:    In order of problems listed above:  Precordial chest pain With her shortness of breath, chest pain, former smoking history, brother with bypass surgery, new onset increased dyspnea/fatigue we will go ahead and proceed with left heart catheterization.  Risks and benefits of been explained including stroke heart attack death renal impairment bleeding.  She is willing to proceed.  Her friend Hassan Rowan is with her.  We will check an echocardiogram to ensure proper structure and function of the heart.  With frequent PVCs, occasionally cardiomyopathy is underlying.  Recent lab work is unremarkable.  PVC's (premature ventricular contractions) Ventricular bigeminy, frequent PVCs noted on ECG.  Could be contributing to her symptoms.  We will check a ZIO monitor to quantify.  If PVC burden is high enough, consider EP evaluation for either ablation or antiarrhythmic therapy.  High cholesterol LDL currently 71 on every other day Crestor 10 mg.  Had trouble with higher dosing.         Medication Adjustments/Labs and  Tests Ordered: Current medicines are reviewed at length with the patient today.  Concerns regarding medicines are outlined above.  No orders of the defined types were placed in this encounter.  No orders of the defined types were placed in this  encounter.   There are no Patient Instructions on file for this visit.   Signed, Candee Furbish, MD  07/18/2021 9:02 AM    [facility]

## 2021-07-18 NOTE — Patient Instructions (Signed)
Medication Instructions:  START ASPIRIN 81 MG DAILY   *If you need a refill on your cardiac medications before your next appointment, please call your pharmacy*  Lab Work: NONE  Testing/Procedures: Your physician has requested that you have an echocardiogram. Echocardiography is a painless test that uses sound waves to create images of your heart. It provides your doctor with information about the size and shape of your heart and how well your heart's chambers and valves are working. This procedure takes approximately one hour. There are no restrictions for this procedure.  72 DAY ZIO, THIS WILL BE MAILED TO YOU   Your physician has requested that you have a cardiac catheterization. Cardiac catheterization is used to diagnose and/or treat various heart conditions. Doctors may recommend this procedure for a number of different reasons. The most common reason is to evaluate chest pain. Chest pain can be a symptom of coronary artery disease (CAD), and cardiac catheterization can show whether plaque is narrowing or blocking your heart's arteries. This procedure is also used to evaluate the valves, as well as measure the blood flow and oxygen levels in different parts of your heart. For further information please visit [email or web address]. Please follow instruction sheet, as given.  Follow-Up: At [facility], you and your health needs are our priority.  As part of our continuing mission to provide you with exceptional heart care, we have created designated Provider Care Teams.  These Care Teams include your primary Cardiologist (physician) and Advanced Practice Providers (APPs -  Physician Assistants and Nurse Practitioners) who all work together to provide you with the care you need, when you need it.  We recommend signing up for the patient portal called "MyChart".  Sign up information is provided on this After Visit Summary.  MyChart is used to connect with patients for Virtual Visits  (Telemedicine).  Patients are able to view lab/test results, encounter notes, upcoming appointments, etc.  Non-urgent messages can be sent to your provider as well.   To learn more about what you can do with MyChart, go to [email or web address].    Your next appointment:   6 week(s)  The format for your next appointment:   In Person  Provider:   DR Marlou Porch OR APP   Other Instructions   You are scheduled for a Cardiac Catheterization on Thursday, June 29 with Dr. [name].  1. Please arrive at the Main Entrance A at [facility]: [place], [place] [address] at 11:30 AM (This time is two hours before your procedure to ensure your preparation). Free valet parking service is available.   Special note: Every effort is made to have your procedure done on time. Please understand that emergencies sometimes delay scheduled procedures.  2. Diet: Do not eat solid foods after midnight.  You may have clear liquids until 5 AM upon the day of the procedure.  3. Medication instructions in preparation for your procedure:   HOLD YOUR [place] MORNING OF CATH   Contrast Allergy: No  On the morning of your procedure, take Aspirin and any morning medicines NOT listed above.  You may use sips of water.  5. Plan to go home the same day, you will only stay overnight if medically necessary. 6. You MUST have a responsible adult to drive you home. 7. An adult MUST be with you the first 24 hours after you arrive home. 8. Bring a current list of your medications, and the last time and date medication taken. 9. Bring ID  and current insurance cards. 10.Please wear clothes that are easy to get on and off and wear slip-on shoes.  Thank you for allowing [place] to care for you!   -- [facility]  ZIO XT- Long Term Monitor Instructions  Your physician has requested you wear a ZIO patch monitor for 14 days.  This is a single patch monitor. [employer]  supplies one patch monitor per enrollment. Additional stickers are not available. Please do not apply patch if you will be having a Nuclear Stress Test,  Echocardiogram, Cardiac CT, MRI, or Chest Xray during the period you would be wearing the  monitor. The patch cannot be worn during these tests. You cannot remove and re-apply the  ZIO XT patch monitor.  Your ZIO patch monitor will be mailed 3 day USPS to your address on file. It may take 3-5 days  to receive your monitor after you have been enrolled.  Once you have received your monitor, please review the enclosed instructions. Your monitor  has already been registered assigning a specific monitor serial # to you.  Billing and Patient Assistance Program Information  We have supplied [employer] with any of your insurance information on file for billing purposes. [employer] offers a sliding scale Patient Assistance Program for patients that do not have  insurance, or whose insurance does not completely cover the cost of the ZIO monitor.  You must apply for the Patient Assistance Program to qualify for this discounted rate.  To apply, please call [employer] at [phone], select option 4, select option 2, ask to apply for  Patient Assistance Program. Theodore Demark will ask your household income, and how many people  are in your household. They will quote your out-of-pocket cost based on that information.  [employer] will also be able to set up a 36-month interest-free payment plan if needed.  Applying the monitor   Shave hair from upper left chest.  Hold abrader disc by orange tab. Rub abrader in 40 strokes over the upper left chest as  indicated in your monitor instructions.  Clean area with 4 enclosed alcohol pads. Let dry.  Apply patch as indicated in monitor instructions. Patch will be placed under collarbone on left  side of chest with arrow pointing upward.  Rub patch adhesive wings for 2 minutes. Remove white label marked "1". Remove the white   label marked "2". Rub patch adhesive wings for 2 additional minutes.  While looking in a mirror, press and release button in center of patch. A small green light will  flash 3-4 times. This will be your only indicator that the monitor has been turned on.  Do not shower for the first 24 hours. You may shower after the first 24 hours.  Press the button if you feel a symptom. You will hear a small click. Record Date, Time and  Symptom in the Patient Logbook.  When you are ready to remove the patch, follow instructions on the last 2 pages of Patient  Logbook. Stick patch monitor onto the last page of Patient Logbook.  Place Patient Logbook in the blue and white box. Use locking tab on box and tape box closed  securely. The blue and white box has prepaid postage on it. Please place it in the mailbox as  soon as possible. Your physician should have your test results approximately 7 days after the  monitor has been [facility]  Call [place]at [phone] you have questions regarding  your ZIO XT patch monitor. Call them immediately if you see an orange light blinking on your  monitor.  If your monitor falls off in less than 4 days, contact our Monitor department at [phone].  If your monitor becomes loose or falls off after 4 days call [employer] at [phone] for  suggestions on securing your monitor

## 2021-07-19 ENCOUNTER — Encounter (HOSPITAL_COMMUNITY)
Admission: RE | Disposition: A | Discharge status: Home / Self Care | Payer: Self-pay | Source: Home / Self Care | Attending: Cardiovascular Disease

## 2021-07-19 ENCOUNTER — Other Ambulatory Visit: Payer: Self-pay

## 2021-07-19 ENCOUNTER — Ambulatory Visit (HOSPITAL_COMMUNITY)
Admission: RE | Admit: 2021-07-19 | Discharge: 2021-07-19 | Disposition: A | Discharge status: Home / Self Care | Payer: Medicare HMO | Attending: Cardiovascular Disease | Admitting: Cardiovascular Disease

## 2021-07-19 DIAGNOSIS — E7800 Pure hypercholesterolemia, unspecified: Secondary | ICD-10-CM | POA: Diagnosis not present

## 2021-07-19 DIAGNOSIS — I429 Cardiomyopathy, unspecified: Secondary | ICD-10-CM | POA: Insufficient documentation

## 2021-07-19 DIAGNOSIS — I447 Left bundle-branch block, unspecified: Secondary | ICD-10-CM | POA: Insufficient documentation

## 2021-07-19 DIAGNOSIS — R072 Precordial pain: Secondary | ICD-10-CM | POA: Diagnosis not present

## 2021-07-19 DIAGNOSIS — Z79899 Other long term (current) drug therapy: Secondary | ICD-10-CM | POA: Insufficient documentation

## 2021-07-19 DIAGNOSIS — Z87891 Personal history of nicotine dependence: Secondary | ICD-10-CM | POA: Insufficient documentation

## 2021-07-19 DIAGNOSIS — R0602 Shortness of breath: Secondary | ICD-10-CM

## 2021-07-19 HISTORY — PX: LEFT HEART CATH AND CORONARY ANGIOGRAPHY: CATH118249

## 2021-07-19 SURGERY — LEFT HEART CATH AND CORONARY ANGIOGRAPHY
Anesthesia: LOCAL

## 2021-07-19 MED ORDER — SODIUM CHLORIDE 0.9 % IV SOLN
250.0000 mL | INTRAVENOUS | Status: DC | PRN
Start: 2021-07-19 — End: 2021-07-19

## 2021-07-19 MED ORDER — MIDAZOLAM HCL 2 MG/2ML IJ SOLN
INTRAMUSCULAR | Status: AC
  Filled 2021-07-19: qty 2

## 2021-07-19 MED ORDER — LABETALOL HCL 5 MG/ML IV SOLN: 10.0000 mg | INTRAVENOUS | Status: DC | PRN

## 2021-07-19 MED ORDER — HEPARIN (PORCINE) IN NACL 1000-0.9 UT/500ML-% IV SOLN
INTRAVENOUS | Status: DC | PRN
  Administered 2021-07-19 (×2): 500 mL

## 2021-07-19 MED ORDER — ASPIRIN 81 MG PO CHEW: 81.0000 mg | CHEWABLE_TABLET | ORAL | Status: DC

## 2021-07-19 MED ORDER — SODIUM CHLORIDE 0.9 % IV SOLN
INTRAVENOUS | Status: AC
Start: 2021-07-19 — End: 2021-07-19

## 2021-07-19 MED ORDER — HYDRALAZINE HCL 20 MG/ML IJ SOLN: 10.0000 mg | INTRAMUSCULAR | Status: DC | PRN

## 2021-07-19 MED ORDER — HEPARIN SODIUM (PORCINE) 1000 UNIT/ML IJ SOLN
INTRAMUSCULAR | Status: AC
  Filled 2021-07-19: qty 10

## 2021-07-19 MED ORDER — SODIUM CHLORIDE 0.9% FLUSH: 3.0000 mL | INTRAVENOUS | Status: DC | PRN

## 2021-07-19 MED ORDER — SODIUM CHLORIDE 0.9 % IV SOLN: 250.0000 mL | INTRAVENOUS | Status: DC | PRN

## 2021-07-19 MED ORDER — FENTANYL CITRATE (PF) 100 MCG/2ML IJ SOLN
INTRAMUSCULAR | Status: DC | PRN
  Administered 2021-07-19: 50 ug via INTRAVENOUS

## 2021-07-19 MED ORDER — SODIUM CHLORIDE 0.9% FLUSH: 3.0000 mL | Freq: Two times a day (BID) | INTRAVENOUS | Status: DC

## 2021-07-19 MED ORDER — FENTANYL CITRATE (PF) 100 MCG/2ML IJ SOLN
INTRAMUSCULAR | Status: AC
  Filled 2021-07-19: qty 2

## 2021-07-19 MED ORDER — LIDOCAINE HCL (PF) 1 % IJ SOLN
INTRAMUSCULAR | Status: DC | PRN
  Administered 2021-07-19: 2 mL

## 2021-07-19 MED ORDER — SODIUM CHLORIDE 0.9% FLUSH
3.0000 mL | INTRAVENOUS | Status: DC | PRN
Start: 2021-07-19 — End: 2021-07-19

## 2021-07-19 MED ORDER — ACETAMINOPHEN 325 MG PO TABS: 650.0000 mg | ORAL_TABLET | ORAL | Status: DC | PRN

## 2021-07-19 MED ORDER — ONDANSETRON HCL 4 MG/2ML IJ SOLN: 4.0000 mg | Freq: Four times a day (QID) | INTRAMUSCULAR | Status: DC | PRN

## 2021-07-19 MED ORDER — IOHEXOL 350 MG/ML SOLN
INTRAVENOUS | Status: DC | PRN
  Administered 2021-07-19: 80 mL

## 2021-07-19 MED ORDER — VERAPAMIL HCL 2.5 MG/ML IV SOLN
INTRAVENOUS | Status: DC | PRN
  Administered 2021-07-19: 10 mL via INTRA_ARTERIAL

## 2021-07-19 MED ORDER — SODIUM CHLORIDE 0.9 % WEIGHT BASED INFUSION
1.0000 mL/kg/h | INTRAVENOUS | Status: DC
Start: 2021-07-19 — End: 2021-07-19
  Administered 2021-07-19: 1 mL/kg/h via INTRAVENOUS

## 2021-07-19 MED ORDER — MIDAZOLAM HCL 2 MG/2ML IJ SOLN
INTRAMUSCULAR | Status: DC | PRN
  Administered 2021-07-19: 2 mg via INTRAVENOUS

## 2021-07-19 MED ORDER — VERAPAMIL HCL 2.5 MG/ML IV SOLN
INTRAVENOUS | Status: AC
  Filled 2021-07-19: qty 2

## 2021-07-19 MED ORDER — HEPARIN (PORCINE) IN NACL 1000-0.9 UT/500ML-% IV SOLN
INTRAVENOUS | Status: AC
  Filled 2021-07-19: qty 1000

## 2021-07-19 MED ORDER — LIDOCAINE HCL (PF) 1 % IJ SOLN
INTRAMUSCULAR | Status: AC
  Filled 2021-07-19: qty 30

## 2021-07-19 MED ORDER — HEPARIN SODIUM (PORCINE) 1000 UNIT/ML IJ SOLN
INTRAMUSCULAR | Status: DC | PRN
  Administered 2021-07-19: 4500 [IU] via INTRAVENOUS

## 2021-07-19 MED ORDER — SODIUM CHLORIDE 0.9 % WEIGHT BASED INFUSION
3.0000 mL/kg/h | INTRAVENOUS | Status: AC
  Administered 2021-07-19: 3 mL/kg/h via INTRAVENOUS

## 2021-07-19 SURGICAL SUPPLY — 11 items
BAND CMPR LRG ZPHR (HEMOSTASIS) ×1
BAND ZEPHYR COMPRESS 30 LONG (HEMOSTASIS) ×1 IMPLANT
CATH 5FR JL3.5 JR4 ANG PIG MP (CATHETERS) ×1 IMPLANT
GLIDESHEATH SLEND SS 6F .021 (SHEATH) ×1 IMPLANT
GUIDEWIRE INQWIRE 1.5J.035X260 (WIRE) IMPLANT
INQWIRE 1.5J .035X260CM (WIRE) ×2
KIT HEART LEFT (KITS) ×2 IMPLANT
PACK CARDIAC CATHETERIZATION (CUSTOM PROCEDURE TRAY) ×2 IMPLANT
SYR MEDRAD MARK 7 150ML (SYRINGE) ×2 IMPLANT
TRANSDUCER W/STOPCOCK (MISCELLANEOUS) ×2 IMPLANT
TUBING CIL FLEX 10 FLL-RA (TUBING) ×2 IMPLANT

## 2021-07-19 NOTE — Discharge Instructions (Signed)
Vitamin D can be resumed at home but the dosage in our system is not accurate.

## 2021-07-19 NOTE — Interval H&P Note (Signed)
History and Physical Interval Note:  07/19/2021 2:04 PM  Sabrina Knight  has presented today for surgery, with the diagnosis of angina - shortness of breath.  The various methods of treatment have been discussed with the patient and family. After consideration of risks, benefits and other options for treatment, the patient has consented to  Procedure(s): LEFT HEART CATH AND CORONARY ANGIOGRAPHY (N/A) as a surgical intervention.  The patient's history has been reviewed, patient examined, no change in status, stable for surgery.  I have reviewed the patient's chart and labs.  Questions were answered to the patient's satisfaction.    Cath Lab Visit (complete for each Cath Lab visit)  Clinical Evaluation Leading to the Procedure:   ACS: No.  Non-ACS:    Anginal Classification: CCS III  Anti-ischemic medical therapy: No Therapy  Non-Invasive Test Results: No non-invasive testing performed  Prior CABG: No previous CABG       [name]

## 2021-07-20 ENCOUNTER — Telehealth: Payer: Self-pay

## 2021-07-20 ENCOUNTER — Encounter (HOSPITAL_COMMUNITY): Payer: Self-pay | Admitting: Cardiovascular Disease

## 2021-07-20 MED ORDER — FUROSEMIDE 20 MG PO TABS: 20.0000 mg | ORAL_TABLET | Freq: Every day | ORAL | 3 refills | Status: DC

## 2021-07-20 MED ORDER — METOPROLOL SUCCINATE ER 25 MG PO TB24: 25.0000 mg | ORAL_TABLET | Freq: Every day | ORAL | 3 refills | Status: DC

## 2021-07-20 NOTE — Telephone Encounter (Signed)
Spoke with the patient and sent in prescriptions for Lasix and Toprol. Patient is scheduled for an echo on 7/14. She currently has a FU scheduled with Dr. Marlou Porch on 8/11.

## 2021-07-20 NOTE — Telephone Encounter (Signed)
-----   Message from Jerline Pain, MD sent at 07/20/2021 11:03 AM EDT ----- Thanks Gerald Stabs.  I will ask the nursing team to start lasix '20mg'$  once a day and Toprol XL '25mg'$  once a day.  Awaiting ECHO/further testing  Let's have her follow up in 2 weeks after ECHO has been performed. Will also likely benefit from EP referral given the frequency of PVCs.  Candee Furbish, MD  ----- Message ----- From: Burnell Blanks, MD Sent: 07/19/2021   2:50 PM EDT To: Jerline Pain, MD  Elta [place]. She has no CAD. I did an LV gram and she appears to have moderate global LV dysfunction, EF around 35%. I wonder if her PVCs are contributory to her NICM? You may want to start her on low dose Lasix while she is completing her outpatient workup. Gerald Stabs

## 2021-08-03 ENCOUNTER — Ambulatory Visit (HOSPITAL_COMMUNITY): Payer: Medicare HMO | Attending: Cardiology

## 2021-08-03 ENCOUNTER — Telehealth: Payer: Self-pay | Admitting: Cardiology

## 2021-08-03 DIAGNOSIS — I493 Ventricular premature depolarization: Secondary | ICD-10-CM | POA: Diagnosis not present

## 2021-08-03 DIAGNOSIS — R072 Precordial pain: Secondary | ICD-10-CM | POA: Diagnosis not present

## 2021-08-03 LAB — ECHOCARDIOGRAM COMPLETE
Area-P 1/2: 4.1 cm2
MV M vel: 5.65 m/s
MV Peak grad: 127.7 mmHg
S' Lateral: 4.6 cm

## 2021-08-03 NOTE — Telephone Encounter (Signed)
Attempted to contact pt to review results and orders r/t recent echo -  EF 30-35%. Reduced pump function. No CAD on cath.  Let's start Entresto 24/'26mg'$  BID   Let's see if we can get her in to see APP in one week.  Candee Furbish, MD   No answer.  Left message for pt to call back to discuss results,orders and her concerns mentioned in this phone note.

## 2021-08-03 NOTE — Telephone Encounter (Signed)
Pt c/o medication issue:  1. Name of Medication: aspirin EC 81 MG tablet  furosemide (LASIX) 20 MG tablet  2. How are you currently taking this medication (dosage and times per day)? Take 81 mg by mouth daily. Swallow whole.  Take 1 tablet (20 mg total) by mouth daily.  3. Are you having a reaction (difficulty breathing--STAT)? no  4. What is your medication issue? Patient states the medication is making her exteremly tired

## 2021-08-06 ENCOUNTER — Encounter: Payer: Self-pay | Admitting: Cardiology

## 2021-08-06 MED ORDER — ENTRESTO 24-26 MG PO TABS
1.0000 | ORAL_TABLET | Freq: Two times a day (BID) | ORAL | 1 refills | Status: DC
Start: 2021-08-06 — End: 2021-10-02

## 2021-08-06 NOTE — Telephone Encounter (Signed)
Patient returned RN's call regarding results and appointment this week with Dr. Marlou Porch.

## 2021-08-06 NOTE — Telephone Encounter (Signed)
Returned call to pt, she has been made aware of her echocardiogram results / recommendations via mychart. I have sent in new prescription Entresto 24/26 bid to [facility], per pt's request. Pt has been scheduled to see Richardson Dopp, PA-C 08/08/21.

## 2021-08-06 NOTE — Telephone Encounter (Signed)
Error

## 2021-08-06 NOTE — Telephone Encounter (Signed)
-----   Message from Jerline Pain, MD sent at 08/03/2021  1:52 PM EDT ----- EF 30-35%. Reduced pump function. No CAD on cath.  Let's start Entresto 24/'26mg'$  BID  Let's see if we can get her in to see APP in one week.  Candee Furbish, MD

## 2021-08-07 NOTE — Progress Notes (Unsigned)
Cardiology Office Note:    Date:  08/07/2021   ID:  Sabrina Knight, DOB [birth year], MRN [id number]  PCP:  Sabrina Agreste, MD   [place] Providers Cardiologist:  None { Click to update primary MD,subspecialty MD or APP then REFRESH:1}    Referring MD: Sabrina Agreste, MD   No chief complaint on file. ***  History of Present Illness:    Sabrina Knight is a 71 y.o. female with a hx of   ***  PVC's Hyperlipidemia, hx of statin intolerance Obesity SHOB Asthma Allergies Former Smoker, quit in 2005  She was last seen by Dr. Etter Sjogren in June 2023 with the chief complaint of shortness of breath, chest pain, and ventricular bigeminy was seen on EKG with a left bundle branch block. Stated she had been more short of breath with exertion while trying to exercise. Due to her risk factors, she was set up for a left heart catheterization, an echocardiogram was ordered and a Zio monitor was ordered. There monitor ordered revealed. ***   Left heart catheter revealed no coronary artery disease, global LV systolic function. LVEDP = 16 dash 20 mmHg. Furthermore, a 2D echocardiogram done July 2023 revealed an ejection fraction of 30 to 35%. She was started on Entresto 24/26 milligrams BID and she was scheduled for follow up in one week.  Today she presents for her one week follow up. Today she states umm***     Past Medical History:  Diagnosis Date   Allergy    Zyrtec, Singulair; allergy testing negative; Orvil Feil.   Arthritis    Asthma due to seasonal allergies    Cataract    Clotting disorder ([place])    Glaucoma    Gout    ITP (idiopathic thrombocytopenic purpura)     Past Surgical History:  Procedure Laterality Date   ABDOMINAL HYSTERECTOMY  01/22/1999   ovaries resected; DUB/fibroids   CARPAL TUNNEL RELEASE Right 09/11/2018   Procedure: CARPAL TUNNEL RELEASE;  Surgeon: Sabrina Brod, MD;  Location: [facility];  Service: Orthopedics;  Laterality: Right;    CARPAL TUNNEL RELEASE Left 10/02/2018   Procedure: CARPAL TUNNEL RELEASE;  Surgeon: Sabrina Brod, MD;  Location: [place];  Service: Orthopedics;  Laterality: Left;  FAB   EYE SURGERY N/A    [facility] 06/26/2019   LEFT HEART CATH AND CORONARY ANGIOGRAPHY N/A 07/19/2021   Procedure: LEFT HEART CATH AND CORONARY ANGIOGRAPHY;  Surgeon: Sabrina Blanks, MD;  Location: [facility];  Service: Cardiovascular;  Laterality: N/A;   spleen removal  01/22/1995   for ITP   spleen removal      Current Medications: No outpatient medications have been marked as taking for the 08/08/21 encounter (Appointment) with Sabrina Knight T, PA-C.     Allergies:   Lisinopril   Social History   Socioeconomic History   Marital status: Widowed    Spouse name: Not on file   Number of children: 0   Years of education: Not on file   Highest education level: Not on file  Occupational History   Occupation: Retired  Tobacco Use   Smoking status: Former    Packs/day: 0.50    Types: Cigarettes    Quit date: 12/04/2003    Years since quitting: 17.6   Smokeless tobacco: Never  Vaping Use   Vaping Use: Never used  Substance and Sexual Activity   Alcohol use: No    Alcohol/week: 0.0 standard drinks of alcohol  Drug use: No   Sexual activity: Not Currently    Birth control/protection: Surgical  Other Topics Concern   Not on file  Social History Narrative   Marital status:  Widowed x since 2001 from [place]; married x 27 years.  Not dating really in 2019.      Children:  None; failed in vitro.      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      Tobacco: quit ; smoked 20 years.      Alcohol: none      Education: [employer].       Exercise: Yes; 3 times per week 2 hours deep water aerobics.      ADLs: independent ADLs; no assistant devices.      Advanced Directives:  None; FULL CODE; no prolonged measures.    HCPOA: Sabrina  Knight/sister-in-law.     Social Determinants of Health   Financial Resource Strain: Low Risk  (04/26/2021)   Overall Financial Resource Strain (CARDIA)    Difficulty of Paying Living Expenses: Not hard at all  Food Insecurity: No Food Insecurity (04/26/2021)   Hunger Vital Sign    Worried About Running Out of Food in the Last Year: Never true    Ran Out of Food in the Last Year: Never true  Transportation Needs: No Transportation Needs (04/26/2021)   PRAPARE - Hydrologist (Medical): No    Lack of Transportation (Non-Medical): No  Physical Activity: Sufficiently Active (04/26/2021)   Exercise Vital Sign    Days of Exercise per Week: 3 days    Minutes of Exercise per Session: 60 min  Stress: No Stress Concern Present (04/26/2021)   [place]    Feeling of Stress : Not at all  Social Connections: Moderately Isolated (04/26/2021)   Social Connection and Isolation Panel [NHANES]    Frequency of Communication with Friends and Family: Twice a week    Frequency of Social Gatherings with Friends and Family: Twice a week    Attends Religious Services: 1 to 4 times per year    Active Member of [employer] or Organizations: No    Attends Archivist Meetings: Never    Marital Status: Widowed     Family History: The patient's ***family history includes COPD in her father; Cancer (age of onset: 52) in her brother; Cancer (age of onset: 82) in her mother; Diabetes in her brother, brother, brother, and sister; Heart disease (age of onset: 16) in her brother; Hyperlipidemia in her mother; Hypertension in her brother, brother, brother, brother, mother, sister, and sister; Mental illness in her mother.  ROS:   Please see the history of present illness.    *** All other systems reviewed and are negative.  EKGs/Labs/Other Studies Reviewed:    The following studies were reviewed today: ***  EKG:  EKG is ***  ordered today.  The ekg ordered today demonstrates ***  Recent Labs: 02/07/2021: ALT 32 07/12/2021: BUN 23; Creatinine, Ser 1.07; Hemoglobin 14.6; Magnesium 2.0; Platelets 386.0; Potassium 4.0; Pro B Natriuretic peptide (BNP) 99.0; Sodium 142; TSH 1.78  Recent Lipid Panel    Component Value Date/Time   CHOL 156 02/07/2021 1125   CHOL 204 (H) 12/29/2019 1147   TRIG 185.0 (H) 02/07/2021 1125   HDL 48.40 02/07/2021 1125   HDL 52 12/29/2019 1147   CHOLHDL 3 02/07/2021 1125   VLDL 37.0 02/07/2021  1125   LDLCALC 71 02/07/2021 1125   LDLCALC 120 (H) 12/29/2019 1147   LDLDIRECT 93.0 06/29/2020 1619     Risk Assessment/Calculations:   {Does this patient have ATRIAL FIBRILLATION?:[phone]}       Physical Exam:    VS:  There were no vitals taken for this visit.    Wt Readings from Last 3 Encounters:  07/19/21 194 lb (88 kg)  07/18/21 194 lb 14.4 oz (88.4 kg)  07/12/21 194 lb (88 kg)     GEN: *** Well nourished, well developed in no acute distress HEENT: Normal NECK: No JVD; No carotid bruits LYMPHATICS: No lymphadenopathy CARDIAC: ***RRR, no murmurs, rubs, gallops RESPIRATORY:  Clear to auscultation without rales, wheezing or rhonchi  ABDOMEN: Soft, non-tender, non-distended MUSCULOSKELETAL:  No edema; No deformity  SKIN: Warm and dry NEUROLOGIC:  Alert and oriented x 3 PSYCHIATRIC:  Normal affect   ASSESSMENT:    No diagnosis found. PLAN:    In order of problems listed above:  *** HFrEF NICM       {Are you ordering a CV Procedure (e.g. stress test, cath, DCCV, TEE, etc)?   Press F2        :[id number]}    Medication Adjustments/Labs and Tests Ordered: Current medicines are reviewed at length with the patient today.  Concerns regarding medicines are outlined above.  No orders of the defined types were placed in this encounter.  No orders of the defined types were placed in this encounter.   There are no Patient Instructions on file for this visit.    SignedFinis Bud, NP  08/07/2021 9:34 PM    [place]

## 2021-08-08 ENCOUNTER — Other Ambulatory Visit (INDEPENDENT_AMBULATORY_CARE_PROVIDER_SITE_OTHER): Payer: Medicare HMO

## 2021-08-08 ENCOUNTER — Encounter: Payer: Self-pay | Admitting: Physician Assistant

## 2021-08-08 ENCOUNTER — Ambulatory Visit: Payer: Medicare HMO | Admitting: Nurse Practitioner

## 2021-08-08 ENCOUNTER — Ambulatory Visit
Admission: RE | Admit: 2021-08-08 | Discharge: 2021-08-08 | Disposition: A | Discharge status: Home / Self Care | Payer: Medicare HMO | Source: Ambulatory Visit | Attending: Nurse Practitioner | Admitting: Nurse Practitioner

## 2021-08-08 VITALS — BP 110/70 | HR 76 | Ht 65.0 in | Wt 192.1 lb

## 2021-08-08 DIAGNOSIS — E1129 Type 2 diabetes mellitus with other diabetic kidney complication: Secondary | ICD-10-CM | POA: Diagnosis not present

## 2021-08-08 DIAGNOSIS — R5383 Other fatigue: Secondary | ICD-10-CM | POA: Diagnosis not present

## 2021-08-08 DIAGNOSIS — I493 Ventricular premature depolarization: Secondary | ICD-10-CM

## 2021-08-08 DIAGNOSIS — R0602 Shortness of breath: Secondary | ICD-10-CM | POA: Diagnosis not present

## 2021-08-08 DIAGNOSIS — R0609 Other forms of dyspnea: Secondary | ICD-10-CM

## 2021-08-08 DIAGNOSIS — E785 Hyperlipidemia, unspecified: Secondary | ICD-10-CM

## 2021-08-08 DIAGNOSIS — R809 Proteinuria, unspecified: Secondary | ICD-10-CM | POA: Diagnosis not present

## 2021-08-08 DIAGNOSIS — I428 Other cardiomyopathies: Secondary | ICD-10-CM

## 2021-08-08 DIAGNOSIS — I5020 Unspecified systolic (congestive) heart failure: Secondary | ICD-10-CM

## 2021-08-08 DIAGNOSIS — R42 Dizziness and giddiness: Secondary | ICD-10-CM | POA: Diagnosis not present

## 2021-08-08 LAB — CBC WITH DIFFERENTIAL/PLATELET
Basophils Absolute: 0.2 10*3/uL — ABNORMAL HIGH (ref 0.0–0.1)
Basophils Relative: 1.4 % (ref 0.0–3.0)
Eosinophils Absolute: 0.5 10*3/uL (ref 0.0–0.7)
Eosinophils Relative: 5 % (ref 0.0–5.0)
HCT: 46.2 % — ABNORMAL HIGH (ref 36.0–46.0)
Hemoglobin: 15.4 g/dL — ABNORMAL HIGH (ref 12.0–15.0)
Lymphocytes Relative: 48.1 % — ABNORMAL HIGH (ref 12.0–46.0)
Lymphs Abs: 5.1 10*3/uL — ABNORMAL HIGH (ref 0.7–4.0)
MCHC: 33.3 g/dL (ref 30.0–36.0)
MCV: 93.1 fl (ref 78.0–100.0)
Monocytes Absolute: 1.1 10*3/uL — ABNORMAL HIGH (ref 0.1–1.0)
Monocytes Relative: 10.3 % (ref 3.0–12.0)
Neutro Abs: 3.7 10*3/uL (ref 1.4–7.7)
Neutrophils Relative %: 35.2 % — ABNORMAL LOW (ref 43.0–77.0)
Platelets: 415 10*3/uL — ABNORMAL HIGH (ref 150.0–400.0)
RBC: 4.97 Mil/uL (ref 3.87–5.11)
RDW: 14.1 % (ref 11.5–15.5)
WBC: 10.6 10*3/uL — ABNORMAL HIGH (ref 4.0–10.5)

## 2021-08-08 MED ORDER — FUROSEMIDE 20 MG PO TABS: ORAL_TABLET | ORAL | 3 refills | Status: DC

## 2021-08-08 NOTE — Patient Instructions (Addendum)
Medication Instructions:  Your physician has recommended you make the following change in your medication:   INCREASE the Lasix to 20 mg taking 2 tablets daily for 3 days then go back to 1 tablet a day   *If you need a refill on your cardiac medications before your next appointment, please call your pharmacy*   Lab Work: TODAY:  BMET & PRO BNP  08/31/21, come to your appointment FASTING:  [place] LIPID  If you have labs (blood work) drawn today and your tests are completely normal, you will receive your results only by: [place] (if you have MyChart) OR A paper copy in the mail If you have any lab test that is abnormal or we need to change your treatment, we will call you to review the results.   Testing/Procedures: A chest x-ray takes a picture of the organs and structures inside the chest, including the heart, lungs, and blood vessels. This test can show several things, including, whether the heart is enlarges; whether fluid is building up in the lungs; and whether pacemaker / defibrillator leads are still in place.  GO TO:  [place], [place]. [address], TODAY BEFORE 4:30    Follow-Up: At [facility], you and your health needs are our priority.  As part of our continuing mission to provide you with exceptional heart care, we have created designated Provider Care Teams.  These Care Teams include your primary Cardiologist (physician) and Advanced Practice Providers (APPs -  Physician Assistants and Nurse Practitioners) who all work together to provide you with the care you need, when you need it.  We recommend signing up for the patient portal called "MyChart".  Sign up information is provided on this After Visit Summary.  MyChart is used to connect with patients for Virtual Visits (Telemedicine).  Patients are able to view lab/test results, encounter notes, upcoming appointments, etc.  Non-urgent messages can be sent to your provider as well.   To  learn more about what you can do with MyChart, go to [email or web address].    Your next appointment:   AS SCHEDULED   The format for your next appointment:   In Person  Provider:   Candee Furbish, MD     Other Instructions Omron is a good blood pressure monitor   Important Information About Sugar

## 2021-08-08 NOTE — Progress Notes (Addendum)
Cardiology Office Note:    Date:  08/08/2021   ID:  Sabrina Knight, DOB 1950-11-18, MRN [id number]  PCP:  Wendie Agreste, MD   [facility] Cardiologist:  Candee Furbish, MD     Referring MD: Wendie Agreste, MD    CC today of lethargic, shortness of breath with exertion, heavy sensation of her arms, and occasional lightheadedness History of Present Illness:    Sabrina Knight is a 71 y.o. female with a hx of    Precordial chest pain Shortness of breath PVC's Hyperlipidemia, hx of statin intolerance Obesity SHOB Asthma Allergies Former Smoker, quit in 2005  She was last seen by Dr. Marlou Porch in June 2023 with the chief complaint of shortness of breath, chest pain, and ventricular bigeminy was seen on EKG with a left bundle branch block. Stated she had been more short of breath with exertion while trying to exercise. Due to her risk factors, she was set up for a left heart catheterization, an echocardiogram was ordered and a Zio monitor was ordered. Patient is wearing this Zio monitor until next Friday. Recent left heart catheter revealed no coronary artery disease, global LV systolic function. LVEDP = 16 - 20 mmHg. Furthermore, a 2D echocardiogram done July 2023 revealed an ejection fraction of 30 to 35%. She was started on Entresto 24/26 milligrams BID and she was scheduled for follow up in one week.  Today she presents for her one week follow up since starting Entresto.  Today she states she feels worse since starting Entresto. Says she feels more shortness of breath with exertion and felt winded coming in for this appointment. Says her arms feel heavy and states she feels lethargic. Denies any palpitations, PND, swelling, weight gain, syncope. States only has felt lightheaded a few times, once with bending over, another time when standing in the line at [place], and another time when sitting down at home. Says only had some chest tightness once but denied any  chest pain and said this was non-radiating and denies any associated symptoms, her recent left heart cath did not reveal any CAD. Denies any bleeding issues. She says she has not lost or gained any weight. She drinks two large water bottles of liquid per day, and she does not add any salt to her foods. Took Lasix this morning and said it didn't seem to help much. Says she is not urinating as much as she should. Wants to figure out what is going on so she can get back to exercising. Has also stopped taking her Losartan.    Past Medical History:  Diagnosis Date   Allergy    Zyrtec, Singulair; allergy testing negative; [facility] Feil.   Arthritis    Asthma due to seasonal allergies    Cataract    Clotting disorder ([place])    Glaucoma    Gout    ITP (idiopathic thrombocytopenic purpura)     Past Surgical History:  Procedure Laterality Date   ABDOMINAL HYSTERECTOMY  01/22/1999   ovaries resected; DUB/fibroids   CARPAL TUNNEL RELEASE Right 09/11/2018   Procedure: CARPAL TUNNEL RELEASE;  Surgeon: Daryll Brod, MD;  Location: [facility];  Service: Orthopedics;  Laterality: Right;   CARPAL TUNNEL RELEASE Left 10/02/2018   Procedure: CARPAL TUNNEL RELEASE;  Surgeon: Daryll Brod, MD;  Location: [facility];  Service: Orthopedics;  Laterality: Left;  FAB   EYE SURGERY N/A    [facility] 06/26/2019   LEFT HEART CATH AND CORONARY ANGIOGRAPHY  N/A 07/19/2021   Procedure: LEFT HEART CATH AND CORONARY ANGIOGRAPHY;  Surgeon: Burnell Blanks, MD;  Location: [facility];  Service: Cardiovascular;  Laterality: N/A;   spleen removal  01/22/1995   for ITP   spleen removal      Current Medications: Current Meds  Medication Sig   allopurinol (ZYLOPRIM) 100 MG tablet Take 1 tablet (100 mg total) by mouth 2 (two) times daily.   aspirin EC 81 MG tablet Take 81 mg by mouth daily. Swallow whole.   Biotin 1 MG CAPS Take 1 mg by mouth daily.   cetirizine (ZYRTEC) 10 MG tablet Take 10 mg  by mouth daily.   co-enzyme Q-10 30 MG capsule Take 30 mg by mouth daily.   furosemide (LASIX) 20 MG tablet Take 2 tablets by mouth daily for 3 days then reduce back to 1 tablet by mouth daily   Glycerin-Polysorbate 80 (REFRESH DRY EYE THERAPY OP) Place 1 drop into both eyes daily as needed (Dry eyes).   Krill Oil 300 MG CAPS Take 300 mg by mouth daily.   magnesium gluconate (MAGONATE) 500 MG tablet Take 500 mg by mouth at bedtime.   metoprolol succinate (TOPROL XL) 25 MG 24 hr tablet Take 1 tablet (25 mg total) by mouth daily.   montelukast (SINGULAIR) 10 MG tablet Take 1 tablet (10 mg total) by mouth daily. (Patient taking differently: Take 10 mg by mouth at bedtime.)   Multiple Vitamin (MULTIVITAMIN WITH MINERALS) TABS tablet Take 1 tablet by mouth daily. Woman   rosuvastatin (CRESTOR) 10 MG tablet TAKE 1 TAB BY MOUTH DAILY IN THE EVENING 5 DAYS PER WEEK. (Patient taking differently: Take 10 mg by mouth every other day. At bedtime)   sacubitril-valsartan (ENTRESTO) 24-26 MG Take 1 tablet by mouth 2 (two) times daily.   SYMBICORT 80-4.5 MCG/ACT inhaler Inhale 2 puffs into the lungs as needed (Shortness of breath).   Tart Cherry 1200 MG CAPS Take 1,200 mg by mouth at bedtime.   VITAMIN D, CHOLECALCIFEROL, PO Take 500 mg by mouth daily.   vitamin E 180 MG (400 UNITS) capsule Take 400 Units by mouth daily.   [DISCONTINUED] furosemide (LASIX) 20 MG tablet Take 1 tablet (20 mg total) by mouth daily.   [DISCONTINUED] losartan (COZAAR) 50 MG tablet Take 0.5 tablets (25 mg total) by mouth daily.   [DISCONTINUED] vitamin k 100 MCG tablet Take 100 mcg by mouth daily.     Allergies:   Lisinopril   Social History   Socioeconomic History   Marital status: Widowed    Spouse name: Not on file   Number of children: 0   Years of education: Not on file   Highest education level: Not on file  Occupational History   Occupation: Retired  Tobacco Use   Smoking status: Former    Packs/day: 0.50     Types: Cigarettes    Quit date: 12/04/2003    Years since quitting: 17.6   Smokeless tobacco: Never  Vaping Use   Vaping Use: Never used  Substance and Sexual Activity   Alcohol use: No    Alcohol/week: 0.0 standard drinks of alcohol   Drug use: No   Sexual activity: Not Currently    Birth control/protection: Surgical  Other Topics Concern   Not on file  Social History Narrative   Marital status:  Widowed x since 2001 from [place]; married x 27 years.  Not dating really in 2019.      Children:  None; failed  in vitro.      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      Tobacco: quit ; smoked 20 years.      Alcohol: none      Education: [employer].       Exercise: Yes; 3 times per week 2 hours deep water aerobics.      ADLs: independent ADLs; no assistant devices.      Advanced Directives:  None; FULL CODE; no prolonged measures.    HCPOA: Darlene Jackson/sister-in-law.     Social Determinants of Health   Financial Resource Strain: Low Risk  (04/26/2021)   Overall Financial Resource Strain (CARDIA)    Difficulty of Paying Living Expenses: Not hard at all  Food Insecurity: No Food Insecurity (04/26/2021)   Hunger Vital Sign    Worried About Running Out of Food in the Last Year: Never true    Ran Out of Food in the Last Year: Never true  Transportation Needs: No Transportation Needs (04/26/2021)   PRAPARE - Hydrologist (Medical): No    Lack of Transportation (Non-Medical): No  Physical Activity: Sufficiently Active (04/26/2021)   Exercise Vital Sign    Days of Exercise per Week: 3 days    Minutes of Exercise per Session: 60 min  Stress: No Stress Concern Present (04/26/2021)   [place]    Feeling of Stress : Not at all  Social Connections: Moderately Isolated (04/26/2021)   Social Connection and Isolation Panel [NHANES]    Frequency of Communication  with Friends and Family: Twice a week    Frequency of Social Gatherings with Friends and Family: Twice a week    Attends Religious Services: 1 to 4 times per year    Active Member of [employer] or Organizations: No    Attends Archivist Meetings: Never    Marital Status: Widowed     Family History: The patient's family history includes COPD in her father; Cancer (age of onset: 70) in her brother; Cancer (age of onset: 46) in her mother; Diabetes in her brother, brother, brother, and sister; Heart disease (age of onset: 16) in her brother; Hyperlipidemia in her mother; Hypertension in her brother, brother, brother, brother, mother, sister, and sister; Mental illness in her mother.  ROS:   Review of Systems  Constitutional: Negative.   HENT: Negative.    Eyes: Negative.   Respiratory:  Positive for cough and shortness of breath. Negative for hemoptysis, sputum production and wheezing.        Says she has been coughing recently due to her allergies.   Cardiovascular:  Positive for orthopnea. Negative for chest pain, palpitations, claudication, leg swelling and PND.  Gastrointestinal: Negative.   Genitourinary: Negative.   Musculoskeletal: Negative.   Skin: Negative.   Neurological:  Negative for dizziness, tingling, tremors, sensory change, speech change, focal weakness, seizures, loss of consciousness, weakness and headaches.       Lightheaded and lethargic - see HPI note  Endo/Heme/Allergies: Negative.   Psychiatric/Behavioral:  Negative for depression, hallucinations, memory loss, substance abuse and suicidal ideas. The patient is not nervous/anxious and does not have insomnia.    Please see the history of present illness.    All other systems reviewed and are negative.  EKGs/Labs/Other Studies Reviewed:    The following studies were reviewed today:   EKG:  EKG is not ordered today.  2D Echo on 08/03/2021: Left ventricular ejection fraction, by estimation, is 30 to 35%.  The left ventricle has moderately decreased function. The left ventricle demonstrates global hypokinesis. The left ventricular internal cavity size was mildly dilated. There is moderate left ventricular hypertrophy. Left ventricular diastolic parameters are consistent with Grade II diastolic dysfunction (pseudonormalization). Elevated left atrial pressure. Right ventricular systolic function is normal. The right ventricular size is normal. Tricuspid regurgitation signal is inadequate for assessing PA pressure. 2. The mitral valve is normal in structure. Mild mitral valve regurgitation. The aortic valve is tricuspid. Aortic valve regurgitation is trivial. Aortic valve sclerosis is present, with no evidence of aortic valve stenosis. 4. The inferior vena cava is normal in size with greater than 50% respiratory variability, suggesting right atrial pressure of 3 mmHg.  Left heart cath on 07/19/2021:  There is moderate left ventricular systolic dysfunction.   LV end diastolic pressure is mildly elevated.   The left ventricular ejection fraction is 35-45% by visual estimate.   There is no mitral valve regurgitation.   No angiographic evidence of CAD.  Global LV systolic dysfunction.  LVEDP=16-20 mmHg   Recommendations: She will have an outpatient echo for further assessment of LV function. Her LV systolic function appears depressed on the LV gram. She is also having frequent PVCs which may have contributed to her cardiomyopathy. I will review with Dr. Marlou Porch. She may benefit from daily Lasix.     14 day Zio monitor (pending)   Recent Labs: 02/07/2021: ALT 32 07/12/2021: BUN 23; Creatinine, Ser 1.07; Magnesium 2.0; Potassium 4.0; Pro B Natriuretic peptide (BNP) 99.0; Sodium 142; TSH 1.78 08/08/2021: Hemoglobin 15.4; Platelets 415.0  Recent Lipid Panel    Component Value Date/Time   CHOL 156 02/07/2021 1125   CHOL 204 (H) 12/29/2019 1147   TRIG 185.0 (H) 02/07/2021 1125   HDL 48.40 02/07/2021 1125    HDL 52 12/29/2019 1147   CHOLHDL 3 02/07/2021 1125   VLDL 37.0 02/07/2021 1125   LDLCALC 71 02/07/2021 1125   LDLCALC 120 (H) 12/29/2019 1147   LDLDIRECT 93.0 06/29/2020 1619     Risk Assessment/Calculations:         The 10-year ASCVD risk score (Arnett DK, et al., 2019) is: 15.8%   Values used to calculate the score:     Age: 32 years     Sex: Female     Is Non-Hispanic African American: No     Diabetic: Yes     Tobacco smoker: No     Systolic Blood Pressure: 009 mmHg     Is BP treated: No     HDL Cholesterol: 48.4 mg/dL     Total Cholesterol: 156 mg/dL   Physical Exam:    VS:  BP 110/70   Pulse 76   Ht '5\' 5"'$  (1.651 m)   Wt 192 lb 1 oz (87.1 kg)   SpO2 97%   BMI 31.96 kg/m    Blood pressure rechecked on exam and was 112/62  Wt Readings from Last 3 Encounters:  08/08/21 192 lb 1 oz (87.1 kg)  07/19/21 194 lb (88 kg)  07/18/21 194 lb 14.4 oz (88.4 kg)     GEN: Well nourished, well developed in no acute distress HEENT: Normal NECK: No JVD; No carotid bruits CARDIAC: RRR, no murmurs, rubs, gallops RESPIRATORY:  Clear, diminished to auscultation without rales, wheezing or rhonchi, dyspnea with exertion ABDOMEN: Soft, non-tender, non-distended MUSCULOSKELETAL:  No edema; No deformity  SKIN: Warm and dry NEUROLOGIC:  Alert  and oriented x 3 PSYCHIATRIC:  Normal affect   ASSESSMENT:    1. HFrEF (heart failure with reduced ejection fraction) ([place])   2. Non-ischemic cardiomyopathy ([place])   3. Dyspnea on exertion   4. Other fatigue   5. PVC's (premature ventricular contractions)   6. Lightheadedness   7. Hyperlipidemia, unspecified hyperlipidemia type    PLAN:    In order of problems listed above:  HFrEF - acute, worsening Non-ischemic cardiomyopathy - acute, worsening Dyspnea on exertion - acute, worsening Fatigue - acute, worsening Her chief complaints today seem to be related to her recently diagnosed reduced ejection fraction. Will increase her Lasix  to 40 mg PO for three days and then she can continue 20 mg of Lasix daily. Will obtain a 2 view Chest X-ray. She will contact me through [place] at the end of the three days to let me know how she is doing. We may need to take her off Entresto and go back to Losartan if the Lasix does not help, as patient feels worse after starting Entresto. Considering of adding a SGLT2i in the future. Low sodium diet, fluid restriction <2L, and daily weights encouraged. Educated to contact our office for weight gain of 2 lbs overnight or 5 lbs in one week. Will draw a BMET, BNP today. F/U with Dr. Marlou Porch next month.  PVC's - stable Denies any recent palpitations. She is currently wearing her Zio monitor and will have this completed by next Friday. We will evaluate this reading when it is done and may consider referral to EP if there are any significant arrhthymias noted.   Lightheadedness - acute, stable Denies passing out and lightheadedness may be related to her blood pressure. Denies any other alarming symptoms. Discussed to monitor BP at home at least 2 hours after medications and sitting for 5-10 minutes. Told her to log this daily and bring this log to her next visit. Continue Zio monitoring and we will see what this monitor shows for any concern of arrthymias. She will send me a MyChart message at the end of 3 days to evaluate her symptoms. Told her to call our office if she develops any worsening symptoms. Will draw a BMET and BNP as mentioned above. BP today was stable.   Hyperlipidemia - chronic, stable Last LDL checked on 02/07/2021 and was 71. We will put an order in for a fasting lipid panel to be checked at her next visit with Dr. Marlou Porch.   8. Disposition - F/U with Dr. Marlou Porch on 08/31/2021 or sooner if needed  Medication Adjustments/Labs and Tests Ordered: Current medicines are reviewed at length with the patient today.  Concerns regarding medicines are outlined above.  Orders Placed This Encounter   Procedures   DG Chest 2 View   Basic metabolic panel   Pro b natriuretic peptide (BNP)   Lipid panel   Meds ordered this encounter  Medications   furosemide (LASIX) 20 MG tablet    Sig: Take 2 tablets by mouth daily for 3 days then reduce back to 1 tablet by mouth daily    Dispense:  90 tablet    Refill:  3    Patient Instructions  Medication Instructions:  Your physician has recommended you make the following change in your medication:   INCREASE the Lasix to 20 mg taking 2 tablets daily for 3 days then go back to 1 tablet a day   *If you need a refill on your cardiac medications before your next appointment, please  call your pharmacy*   Lab Work: TODAY:  BMET & PRO BNP  08/31/21, come to your appointment FASTING:  WE WILL CHECK LIPID  If you have labs (blood work) drawn today and your tests are completely normal, you will receive your results only by: [place] (if you have MyChart) OR A paper copy in the mail If you have any lab test that is abnormal or we need to change your treatment, we will call you to review the results.   Testing/Procedures: A chest x-ray takes a picture of the organs and structures inside the chest, including the heart, lungs, and blood vessels. This test can show several things, including, whether the heart is enlarges; whether fluid is building up in the lungs; and whether pacemaker / defibrillator leads are still in place.  GO TO:  [place], [place]. [address], TODAY BEFORE 4:30    Follow-Up: At [facility], you and your health needs are our priority.  As part of our continuing mission to provide you with exceptional heart care, we have created designated Provider Care Teams.  These Care Teams include your primary Cardiologist (physician) and Advanced Practice Providers (APPs -  Physician Assistants and Nurse Practitioners) who all work together to provide you with the care you need, when you need it.  We  recommend signing up for the patient portal called "MyChart".  Sign up information is provided on this After Visit Summary.  MyChart is used to connect with patients for Virtual Visits (Telemedicine).  Patients are able to view lab/test results, encounter notes, upcoming appointments, etc.  Non-urgent messages can be sent to your provider as well.   To learn more about what you can do with MyChart, go to [email or web address].    Your next appointment:   AS SCHEDULED   The format for your next appointment:   In Person  Provider:   Candee Furbish, MD     Other Instructions Omron is a good blood pressure monitor   Important Information About Sugar         Signed, Finis Bud, NP  08/08/2021 4:47 PM    [place]

## 2021-08-08 NOTE — Progress Notes (Signed)
Prior labs reviewed - just needs repeat CBC

## 2021-08-09 LAB — BASIC METABOLIC PANEL
BUN/Creatinine Ratio: 23 (ref 12–28)
BUN: 28 mg/dL — ABNORMAL HIGH (ref 8–27)
CO2: 21 mmol/L (ref 20–29)
Calcium: 10 mg/dL (ref 8.7–10.3)
Chloride: 104 mmol/L (ref 96–106)
Creatinine, Ser: 1.24 mg/dL — ABNORMAL HIGH (ref 0.57–1.00)
Glucose: 110 mg/dL — ABNORMAL HIGH (ref 70–99)
Potassium: 4.5 mmol/L (ref 3.5–5.2)
Sodium: 147 mmol/L — ABNORMAL HIGH (ref 134–144)
eGFR: 46 mL/min/{1.73_m2} — ABNORMAL LOW (ref >59)

## 2021-08-09 LAB — PRO B NATRIURETIC PEPTIDE: NT-Pro BNP: 241 pg/mL (ref 0–301)

## 2021-08-10 ENCOUNTER — Other Ambulatory Visit: Payer: Self-pay | Admitting: *Deleted

## 2021-08-10 DIAGNOSIS — R7989 Other specified abnormal findings of blood chemistry: Secondary | ICD-10-CM

## 2021-08-13 ENCOUNTER — Other Ambulatory Visit: Payer: Medicare HMO

## 2021-08-13 DIAGNOSIS — R7989 Other specified abnormal findings of blood chemistry: Secondary | ICD-10-CM | POA: Diagnosis not present

## 2021-08-13 LAB — BASIC METABOLIC PANEL
BUN/Creatinine Ratio: 21 (ref 12–28)
BUN: 20 mg/dL (ref 8–27)
CO2: 23 mmol/L (ref 20–29)
Calcium: 9.9 mg/dL (ref 8.7–10.3)
Chloride: 103 mmol/L (ref 96–106)
Creatinine, Ser: 0.96 mg/dL (ref 0.57–1.00)
Glucose: 119 mg/dL — ABNORMAL HIGH (ref 70–99)
Potassium: 4.5 mmol/L (ref 3.5–5.2)
Sodium: 142 mmol/L (ref 134–144)
eGFR: 63 mL/min/{1.73_m2} (ref >59)

## 2021-08-14 ENCOUNTER — Telehealth: Payer: Self-pay

## 2021-08-14 ENCOUNTER — Telehealth: Payer: Self-pay | Admitting: *Deleted

## 2021-08-14 DIAGNOSIS — Z79899 Other long term (current) drug therapy: Secondary | ICD-10-CM

## 2021-08-14 DIAGNOSIS — R0609 Other forms of dyspnea: Secondary | ICD-10-CM

## 2021-08-14 NOTE — Telephone Encounter (Signed)
Patients ZIO XT monitor came off after 11 days.  Patient mailed back to [facility] for processing 08/14/21.

## 2021-08-14 NOTE — Telephone Encounter (Signed)
-----   Message from Finis Bud, NP sent at 08/14/2021  7:53 AM EDT ----- Please call and update patient regarding her recent BMET results. Kidney function is back down to normal. She can continue the 40 mg of Lasix daily X 2 days (want to be careful with her kidney function) and then go down to 20 mg of Lasix daily. I want to recheck another BMET in one week. If she does not feel better after finishing the 2 days of 40 mg of daily Lasix, she needs to let us know. Please remind her that she needs to continue to weigh herself daily and log this and let us know if she gains more than 3 lbs in one day or more than 5 lbs in one week. Continue to limit sodium intake to < 2 grams per day and limit fluid intake to < 2 liters per day or between 32-64 fluid ounces in one day. She will follow up with Dr. Marlou Porch on 08/31/21.   Thanks so much! Finis Bud, NP

## 2021-08-14 NOTE — Telephone Encounter (Signed)
Patient stated she is doing fine on Lasix 20 mg daily and does not feel like she needs the 40 mg  dose. Patient stated she is keeping track of her weight and it is fine. Patient will continue the salt and fluid restrictions. Will place order for BMET.

## 2021-08-14 NOTE — Progress Notes (Signed)
Please call and update patient regarding her recent BMET results. Kidney function is back down to normal. She can continue the 40 mg of Lasix daily X 2 days (want to be careful with her kidney function) and then go down to 20 mg of Lasix daily. I want to recheck another BMET in one week. If she does not feel better after finishing the 2 days of 40 mg of daily Lasix, she needs to let us know. Please remind her that she needs to continue to weigh herself daily and log this and let us know if she gains more than 3 lbs in one day or more than 5 lbs in one week. Continue to limit sodium intake to < 2 grams per day and limit fluid intake to < 2 liters per day or between 32-64 fluid ounces in one day. She will follow up with Dr. Marlou Porch on 08/31/21.   Thanks so much! Finis Bud, NP

## 2021-08-15 ENCOUNTER — Encounter: Payer: Self-pay | Admitting: Family Medicine

## 2021-08-15 ENCOUNTER — Ambulatory Visit (INDEPENDENT_AMBULATORY_CARE_PROVIDER_SITE_OTHER): Payer: Medicare HMO | Admitting: Family Medicine

## 2021-08-15 VITALS — BP 114/74 | HR 64 | Temp 98.1°F | Ht 65.0 in | Wt 192.8 lb

## 2021-08-15 DIAGNOSIS — M79644 Pain in right finger(s): Secondary | ICD-10-CM | POA: Diagnosis not present

## 2021-08-15 DIAGNOSIS — I5020 Unspecified systolic (congestive) heart failure: Secondary | ICD-10-CM

## 2021-08-15 DIAGNOSIS — R809 Proteinuria, unspecified: Secondary | ICD-10-CM | POA: Diagnosis not present

## 2021-08-15 DIAGNOSIS — M109 Gout, unspecified: Secondary | ICD-10-CM

## 2021-08-15 DIAGNOSIS — D72829 Elevated white blood cell count, unspecified: Secondary | ICD-10-CM

## 2021-08-15 DIAGNOSIS — E1129 Type 2 diabetes mellitus with other diabetic kidney complication: Secondary | ICD-10-CM | POA: Diagnosis not present

## 2021-08-15 DIAGNOSIS — E785 Hyperlipidemia, unspecified: Secondary | ICD-10-CM | POA: Diagnosis not present

## 2021-08-15 DIAGNOSIS — Z0000 Encounter for general adult medical examination without abnormal findings: Secondary | ICD-10-CM | POA: Diagnosis not present

## 2021-08-15 DIAGNOSIS — M8580 Other specified disorders of bone density and structure, unspecified site: Secondary | ICD-10-CM | POA: Diagnosis not present

## 2021-08-15 MED ORDER — ROSUVASTATIN CALCIUM 10 MG PO TABS: ORAL_TABLET | ORAL | 1 refills | Status: DC

## 2021-08-15 MED ORDER — ALLOPURINOL 100 MG PO TABS: 100.0000 mg | ORAL_TABLET | Freq: Two times a day (BID) | ORAL | 1 refills | Status: DC

## 2021-08-15 NOTE — Progress Notes (Signed)
Subjective:  Patient ID: Sabrina Knight, female    DOB: 11-29-1950  Age: 71 y.o. MRN: [id number]  CC:  Chief Complaint  Patient presents with   Annual Exam    Pt reports she has had blood work recently, pt has seen Cardiology     HPI Sabrina Knight presents for Annual Exam  Care team: PCP, me Cardiology, Dr. Marlou Porch Allergist, Dr. Orvil Feil Ophthalmology, Dr. Herbert Deaner   Heart failure with reduced ejection fraction Recent cardiology visit July 19.  Higher with bigeminy, dyspnea on exertion.  Left heart catheterization without significant coronary artery disease, echo with EF 30 to 35%.  Started on Entresto 24/26 mg twice daily.  Lethargy, felt worse on that medication.  Lasix was increased to 40 mg for 3 days then return to 20 mg daily.  Option to discontinue Entresto.  Option to add SGLT2.  Low-sodium diet, fluid restriction, daily weights.  1 month follow-up with Dr. Marlou Porch.  ZIO monitor in place to evaluate arrhythmias.  Plan for fasting lipid panel at upcoming visit with cardiology August 11.  Recent BMP on July 24 with normal renal function, down from 1.24-0.96.  Glucose 119. Per cardiology notes - should be on '20mg'$  lasix per day.  Monitoring daily weights - staying same. She stopped furosemide temporarily 3 days ago - has not restarted. Feeling better and still taking Entresto.   Leukocytosis 13.6 on June 22, down to 10.6 on July 19.  Borderline hemoglobin of 15.4 at that time, platelets 415, previous elevated platelets, with higher readings. No recent illness or fevers. No new bleeding. Nonsmoker.   Gout: Last flare: none recent. R ring finger swollen since last night. No prior similar sx's with gout. Some yardwork yesterday, arthritis in hands, with similar sx's in past when flared. Heating pad last night.  Daily meds: Allopurinol 100 mg twice daily Prn med: none.  Lab Results  Component Value Date   LABURIC 5.5 12/29/2019   Hyperlipidemia: Crestor 10 mg, 4 days/week. No  new myalgias. Not fasting today.  Lab Results  Component Value Date   CHOL 156 02/07/2021   HDL 48.40 02/07/2021   LDLCALC 71 02/07/2021   LDLDIRECT 93.0 06/29/2020   TRIG 185.0 (H) 02/07/2021   CHOLHDL 3 02/07/2021   Lab Results  Component Value Date   ALT 32 02/07/2021   AST 26 02/07/2021   ALKPHOS 95 02/07/2021   BILITOT 0.4 02/07/2021   Diabetes: With microalbuminuria, diet controlled.  On statin, Entresto.  Slight upward trend of A1c's since December 2021.  Microalbumin: Due Optho, foot exam, pneumovax:  Up-to-date Lab Results  Component Value Date   HGBA1C 7.2 (H) 02/07/2021   HGBA1C 7.0 (H) 06/29/2020   HGBA1C 6.7 (H) 12/29/2019   Lab Results  Component Value Date   LDLCALC 71 02/07/2021   CREATININE 0.96 08/13/2021        08/15/2021    8:16 AM 07/12/2021    3:41 PM 04/26/2021    8:25 AM 04/26/2021    8:22 AM 02/07/2021   10:51 AM  Depression screen PHQ 2/9  Decreased Interest 0 1 0 0 0  Down, Depressed, Hopeless 0 0 0 0 0  PHQ - 2 Score 0 1 0 0 0  Altered sleeping  2   0  Tired, decreased energy  2   0  Change in appetite  0   0  Feeling bad or failure about yourself   0   0  Trouble concentrating  0  0  Moving slowly or fidgety/restless  0   0  Suicidal thoughts  0   0  PHQ-9 Score  5   0  Difficult doing work/chores     Not difficult at all    Health Maintenance  Topic Date Due   HEMOGLOBIN A1C  08/07/2021   Zoster Vaccines- Shingrix (1 of 2) 10/12/2021 (Originally 06/24/1969)   INFLUENZA VACCINE  08/21/2021   MAMMOGRAM  11/06/2021   FOOT EXAM  02/07/2022   OPHTHALMOLOGY EXAM  06/15/2022   COLONOSCOPY (Pts 45-79yr Insurance coverage will need to be confirmed)  02/27/2027   TETANUS/TDAP  06/10/2030   Pneumonia Vaccine 71 Years old  Completed   DEXA SCAN  Completed   COVID-19 Vaccine  Completed   Hepatitis C Screening  Completed   HPV VACCINES  Aged Out  Colonoscopy 02/26/2017. Repeat 5 years with hx of polyps.  Mammogram 11/06/2020, has  scheduled with [facility].  DEXA scan November 2019, low bone mass with T score -2.0 left femoral neck.  Family history of osteoporosis. Due for repeat. Takes daily calcium and vit D.   Immunization History  Administered Date(s) Administered   Influenza, High Dose Seasonal PF 10/15/2017, 10/29/2018, 10/29/2018, 12/01/2018, 12/01/2019   Influenza,inj,Quad PF,6+ Mos 10/08/2016   Influenza-Unspecified 11/29/2019, 10/31/2020   PFIZER(Purple Top)SARS-COV-2 Vaccination 02/23/2019, 03/30/2019, 11/29/2019, 05/10/2020   Pfizer Covid-19 Vaccine Bivalent Booster 185yr& up 10/31/2020   Pneumococcal Conjugate-13 10/08/2016   Pneumococcal Polysaccharide-23 10/20/2017, 12/01/2019   Td 10/08/2016   Tdap 06/09/2020   Zoster, Live 06/29/2010  Shingrix: had at pharmacy.  Option of additional bivalent booster discussed.   No results found. Appt with optho in November. Yearly visits.   Dental:every 6 months.   Alcohol:none  Tobacco: none  Exercise: usually pool based exercise - 3 days per week few hours each. On hold with heart monitor.    History Patient Active Problem List   Diagnosis Date Noted   Shortness of breath    Precordial chest pain 07/18/2021   PVC's (premature ventricular contractions) 07/18/2021   Degenerative disc disease, lumbar 07/12/2020   Degenerative arthritis of left knee 05/26/2020   Greater trochanteric bursitis of both hips 05/26/2020   Mild persistent asthma, uncomplicated 1219/50/9326 Vasomotor rhinitis 01/08/2019   Other allergic rhinitis 01/08/2019   Bilateral carpal tunnel syndrome 09/25/2018   Dyslipidemia 10/22/2017   Statin intolerance 10/22/2017   Newly diagnosed diabetes (HCPenton10/02/2017   Gout 12/23/2016   Class 1 obesity due to excess calories with serious comorbidity and body mass index (BMI) of 31.0 to 31.9 in adult 05/19/2016   Osteopenia of necks of both femurs 05/19/2016   Thrombocytosis 02/08/2014   High cholesterol 02/01/2013   H/O splenectomy  02/01/2013   Non-allergic rhinitis 02/01/2013   Glaucoma 02/01/2013   Past Medical History:  Diagnosis Date   Allergy    Zyrtec, Singulair; allergy testing negative; WhOrvil Feil  Arthritis    Asthma due to seasonal allergies    Cataract    Clotting disorder (HCOconto   Glaucoma    Gout    ITP (idiopathic thrombocytopenic purpura)    Past Surgical History:  Procedure Laterality Date   ABDOMINAL HYSTERECTOMY  01/22/1999   ovaries resected; DUB/fibroids   CARPAL TUNNEL RELEASE Right 09/11/2018   Procedure: CARPAL TUNNEL RELEASE;  Surgeon: KuDaryll BrodMD;  [facility] Service: [facility];  Laterality: Right;   CARPAL TUNNEL RELEASE Left 10/02/2018   Procedure: CARPAL TUNNEL RELEASE;  Surgeon: KuDaryll Brod  MD;  Location: [facility];  Service: Orthopedics;  Laterality: Left;  FAB   EYE SURGERY N/A    [employer] 06/26/2019   LEFT HEART CATH AND CORONARY ANGIOGRAPHY N/A 07/19/2021   Procedure: LEFT HEART CATH AND CORONARY ANGIOGRAPHY;  Surgeon: Burnell Blanks, MD;  Location: [facility];  Service: Cardiovascular;  Laterality: N/A;   spleen removal  01/22/1995   for ITP   spleen removal     Allergies  Allergen Reactions   Lisinopril Cough   Prior to Admission medications   Medication Sig Start Date End Date Taking? Authorizing Provider  allopurinol (ZYLOPRIM) 100 MG tablet Take 1 tablet (100 mg total) by mouth 2 (two) times daily. 02/07/21  Yes Wendie Agreste, MD  aspirin EC 81 MG tablet Take 81 mg by mouth daily. Swallow whole.   Yes [provider]  Biotin 1 MG CAPS Take 1 mg by mouth daily.   Yes [provider]  cetirizine (ZYRTEC) 10 MG tablet Take 10 mg by mouth daily.   Yes [provider]  co-enzyme Q-10 30 MG capsule Take 30 mg by mouth daily.   Yes [provider]  furosemide (LASIX) 20 MG tablet Take 2 tablets by mouth daily for 3 days then reduce back to 1 tablet by mouth daily 08/08/21  Yes  Finis Bud, NP  Glycerin-Polysorbate 80 (REFRESH DRY EYE THERAPY OP) Place 1 drop into both eyes daily as needed (Dry eyes).   Yes [provider]  Javier Docker Oil 300 MG CAPS Take 300 mg by mouth daily.   Yes [provider]  magnesium gluconate (MAGONATE) 500 MG tablet Take 500 mg by mouth at bedtime.   Yes [provider]  metoprolol succinate (TOPROL XL) 25 MG 24 hr tablet Take 1 tablet (25 mg total) by mouth daily. 07/20/21  Yes Jerline Pain, MD  montelukast (SINGULAIR) 10 MG tablet Take 1 tablet (10 mg total) by mouth daily. Patient taking differently: Take 10 mg by mouth at bedtime. 02/07/21  Yes Wendie Agreste, MD  Multiple Vitamin (MULTIVITAMIN WITH MINERALS) TABS tablet Take 1 tablet by mouth daily. Woman   Yes [provider]  rosuvastatin (CRESTOR) 10 MG tablet TAKE 1 TAB BY MOUTH DAILY IN THE EVENING 5 DAYS PER WEEK. Patient taking differently: Take 10 mg by mouth every other day. At bedtime 02/07/21  Yes Wendie Agreste, MD  sacubitril-valsartan (ENTRESTO) 24-26 MG Take 1 tablet by mouth 2 (two) times daily. 08/06/21  Yes Jerline Pain, MD  SYMBICORT 80-4.5 MCG/ACT inhaler Inhale 2 puffs into the lungs as needed (Shortness of breath). 06/30/21  Yes [provider]  Tart Cherry 1200 MG CAPS Take 1,200 mg by mouth at bedtime.   Yes [provider]  VITAMIN D, CHOLECALCIFEROL, PO Take 500 mg by mouth daily.   Yes [provider]  vitamin E 180 MG (400 UNITS) capsule Take 400 Units by mouth daily.   Yes [provider]   Social History   Socioeconomic History   Marital status: Widowed    Spouse name: Not on file   Number of children: 0   Years of education: Not on file   Highest education level: Not on file  Occupational History   Occupation: Retired  Tobacco Use   Smoking status: Former    Packs/day: 0.50    Types: Cigarettes    Quit date: 12/04/2003    Years since quitting: 17.7   Smokeless tobacco:  Never  Vaping Use   Vaping Use: Never used  Substance and Sexual Activity   Alcohol use: No    Alcohol/week: 0.0 standard drinks of alcohol   Drug use: No   Sexual activity: Not Currently    Birth control/protection: Surgical  Other Topics Concern   Not on file  Social History Narrative   Marital status:  Widowed x since 2001 from [place]; married x 27 years.  Not dating really in 2019.      Children:  None; failed in vitro.      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      Tobacco: quit ; smoked 20 years.      Alcohol: none      Education: [employer].       Exercise: Yes; 3 times per week 2 hours deep water aerobics.      ADLs: independent ADLs; no assistant devices.      Advanced Directives:  None; FULL CODE; no prolonged measures.    HCPOA: Darlene Jackson/sister-in-law.     Social Determinants of Health   Financial Resource Strain: Low Risk  (04/26/2021)   Overall Financial Resource Strain (CARDIA)    Difficulty of Paying Living Expenses: Not hard at all  Food Insecurity: No Food Insecurity (04/26/2021)   Hunger Vital Sign    Worried About Running Out of Food in the Last Year: Never true    Ran Out of Food in the Last Year: Never true  Transportation Needs: No Transportation Needs (04/26/2021)   PRAPARE - Hydrologist (Medical): No    Lack of Transportation (Non-Medical): No  Physical Activity: Sufficiently Active (04/26/2021)   Exercise Vital Sign    Days of Exercise per Week: 3 days    Minutes of Exercise per Session: 60 min  Stress: No Stress Concern Present (04/26/2021)   [place]    Feeling of Stress : Not at all  Social Connections: Moderately Isolated (04/26/2021)   Social Connection and Isolation Panel [NHANES]    Frequency of Communication with Friends and Family: Twice a week    Frequency of Social Gatherings with Friends and Family:  Twice a week    Attends Religious Services: 1 to 4 times per year    Active Member of [employer] or Organizations: No    Attends Archivist Meetings: Never    Marital Status: Widowed  Intimate Partner Violence: Not At Risk (04/26/2021)   Humiliation, Afraid, Rape, and Kick questionnaire    Fear of Current or Ex-Partner: No    Emotionally Abused: No    Physically Abused: No    Sexually Abused: No    Review of Systems   Objective:   Vitals:   08/15/21 0812  BP: 114/74  Pulse: 64  Temp: 98.1 F (36.7 C)  TempSrc: Oral  SpO2: 95%  Weight: 192 lb 12.8 oz (87.5 kg)  Height: '5\' 5"'$  (1.651 m)     Physical Exam Constitutional:      Appearance: She is well-developed.  HENT:     Head: Normocephalic and atraumatic.     Right Ear: External ear normal.     Left Ear: External ear normal.  Eyes:     Conjunctiva/sclera: Conjunctivae normal.     Pupils: Pupils are equal, round, and reactive to light.  Neck:     Thyroid: No thyromegaly.  Cardiovascular:  Rate and Rhythm: Normal rate and regular rhythm.     Heart sounds: Normal heart sounds. No murmur heard. Pulmonary:     Effort: Pulmonary effort is normal. No respiratory distress.     Breath sounds: Normal breath sounds. No wheezing.  Abdominal:     General: Bowel sounds are normal.     Palpations: Abdomen is soft.     Tenderness: There is no abdominal tenderness.  Musculoskeletal:        General: No tenderness. Normal range of motion.     Cervical back: Normal range of motion and neck supple.     Comments: Right fourth phalanx, discomfort with slight swelling of the PIP.  No erythema, no wounds.  Somewhat guarded exam.  She was able to remove her ring.  Lymphadenopathy:     Cervical: No cervical adenopathy.  Skin:    General: Skin is warm and dry.     Findings: No rash.  Neurological:     Mental Status: She is alert and oriented to person, place, and time.  Psychiatric:        Behavior: Behavior normal.         Thought Content: Thought content normal.        Assessment & Plan:  Sabrina Knight is a 71 y.o. female . Annual physical exam - Plan: Lipid panel, Comprehensive metabolic panel, Hemoglobin A1c  - -anticipatory guidance as below in AVS, screening labs above. Health maintenance items as above in HPI discussed/recommended as applicable.   Hyperlipidemia, unspecified hyperlipidemia type - Plan: rosuvastatin (CRESTOR) 10 MG tablet, Lipid panel  -  Stable, tolerating current regimen. Medications refilled. Labs pending as above.   Gout, unspecified cause, unspecified chronicity, unspecified site - Plan: allopurinol (ZYLOPRIM) 100 MG tablet  -Tolerating allopurinol, continue same, question flare of gout versus osteoarthritis in finger.  Localized treatment discussed initially with RTC precautions.  Type 2 diabetes mellitus with microalbuminuria, without long-term current use of insulin (HCC) - Plan: Hemoglobin A1c, Microalbumin / creatinine urine ratio  -Check labs, no new med changes at this time.  Adjustments accordingly based on results.  HFrEF (heart failure with reduced ejection fraction) (HCC)  -Appears euvolemic, recommended discussing furosemide dosing with cardiology.  Continue cardiology follow-up.  Leukocytosis, unspecified type  -Asymptomatic, improved on last check, can repeat testing next visit with RTC precautions if new fevers or symptoms.  Consider hematology eval if persistent elevations.  Osteopenia, unspecified location - Plan: DG Bone Density  -Continue calcium, vitamin D, updated bone density testing ordered.  Finger pain, right  -No known injury, repetitive use with outside work possible flare of arthritis versus gout flare.  Topical treatment with anti-inflammatory, heat or ice as needed with RTC precautions.  Recommended keeping ring off finger temporarily until swelling subsides.  Meds ordered this encounter  Medications   rosuvastatin (CRESTOR) 10 MG tablet     Sig: TAKE 1 TAB BY MOUTH DAILY IN THE EVENING 5 DAYS PER WEEK.    Dispense:  90 tablet    Refill:  1   allopurinol (ZYLOPRIM) 100 MG tablet    Sig: Take 1 tablet (100 mg total) by mouth 2 (two) times daily.    Dispense:  180 tablet    Refill:  1   Patient Instructions  It appears cardiology wanted you to remain on '20mg'$  of lasix per day - please clarify dosing with them and let them know about your stable weights.  Ok to apply heat or ice to finger temporarily, short  course of voltaren gel.  If not improving next few days, could be gout flare and may need to use prednisone.  Let me know.  Return to the clinic or go to the nearest emergency room if any of your symptoms worsen or new symptoms occur. Fasting labs in next few days.  Option of bivalent covid booster at your pharmacy.  No other med changes at this time.  Take care.       Signed,   Merri Ray, MD [place], [facility] 08/15/21 9:09 AM

## 2021-08-15 NOTE — Patient Instructions (Addendum)
It appears cardiology wanted you to remain on '20mg'$  of lasix per day - please clarify dosing with them and let them know about your stable weights.  Ok to apply heat or ice to finger temporarily, short course of voltaren gel.  If not improving next few days, could be gout flare and may need to use prednisone.  Let me know.  Return to the clinic or go to the nearest emergency room if any of your symptoms worsen or new symptoms occur. Fasting labs in next few days.  Option of bivalent covid booster at your pharmacy.  No other med changes at this time.  Take care.

## 2021-08-16 ENCOUNTER — Other Ambulatory Visit (INDEPENDENT_AMBULATORY_CARE_PROVIDER_SITE_OTHER): Payer: Medicare HMO

## 2021-08-16 DIAGNOSIS — E785 Hyperlipidemia, unspecified: Secondary | ICD-10-CM

## 2021-08-16 DIAGNOSIS — Z0000 Encounter for general adult medical examination without abnormal findings: Secondary | ICD-10-CM

## 2021-08-16 DIAGNOSIS — R0609 Other forms of dyspnea: Secondary | ICD-10-CM

## 2021-08-16 DIAGNOSIS — E1129 Type 2 diabetes mellitus with other diabetic kidney complication: Secondary | ICD-10-CM

## 2021-08-16 DIAGNOSIS — I499 Cardiac arrhythmia, unspecified: Secondary | ICD-10-CM

## 2021-08-16 DIAGNOSIS — R7989 Other specified abnormal findings of blood chemistry: Secondary | ICD-10-CM

## 2021-08-16 DIAGNOSIS — R809 Proteinuria, unspecified: Secondary | ICD-10-CM

## 2021-08-16 LAB — COMPREHENSIVE METABOLIC PANEL
ALT: 47 U/L — ABNORMAL HIGH (ref 0–35)
AST: 42 U/L — ABNORMAL HIGH (ref 0–37)
Albumin: 4.1 g/dL (ref 3.5–5.2)
Alkaline Phosphatase: 85 U/L (ref 39–117)
BUN: 22 mg/dL (ref 6–23)
CO2: 25 mEq/L (ref 19–32)
Calcium: 9.4 mg/dL (ref 8.4–10.5)
Chloride: 105 mEq/L (ref 96–112)
Creatinine, Ser: 1.05 mg/dL (ref 0.40–1.20)
GFR: 53.22 mL/min — ABNORMAL LOW (ref >60.00)
Glucose, Bld: 137 mg/dL — ABNORMAL HIGH (ref 70–99)
Potassium: 3.9 mEq/L (ref 3.5–5.1)
Sodium: 140 mEq/L (ref 135–145)
Total Bilirubin: 0.5 mg/dL (ref 0.2–1.2)
Total Protein: 7.2 g/dL (ref 6.0–8.3)

## 2021-08-16 LAB — LIPID PANEL
Cholesterol: 169 mg/dL (ref 0–200)
HDL: 41.2 mg/dL (ref >39.00)
NonHDL: 128.15
Total CHOL/HDL Ratio: 4
Triglycerides: 236 mg/dL — ABNORMAL HIGH (ref 0.0–149.0)
VLDL: 47.2 mg/dL — ABNORMAL HIGH (ref 0.0–40.0)

## 2021-08-16 LAB — HEMOGLOBIN A1C: Hgb A1c MFr Bld: 7.6 % — ABNORMAL HIGH (ref 4.6–6.5)

## 2021-08-16 LAB — MAGNESIUM: Magnesium: 1.9 mg/dL (ref 1.5–2.5)

## 2021-08-16 LAB — BRAIN NATRIURETIC PEPTIDE: Pro B Natriuretic peptide (BNP): 159 pg/mL — ABNORMAL HIGH (ref 0.0–100.0)

## 2021-08-16 LAB — MICROALBUMIN / CREATININE URINE RATIO
Creatinine,U: 203 mg/dL
Microalb Creat Ratio: 0.9 mg/g (ref 0.0–30.0)
Microalb, Ur: 1.8 mg/dL (ref 0.0–1.9)

## 2021-08-16 LAB — TSH: TSH: 1.17 u[IU]/mL (ref 0.35–5.50)

## 2021-08-16 LAB — LDL CHOLESTEROL, DIRECT: Direct LDL: 91 mg/dL

## 2021-08-20 ENCOUNTER — Other Ambulatory Visit: Payer: Medicare HMO

## 2021-08-20 DIAGNOSIS — R0609 Other forms of dyspnea: Secondary | ICD-10-CM

## 2021-08-20 DIAGNOSIS — Z79899 Other long term (current) drug therapy: Secondary | ICD-10-CM

## 2021-08-21 ENCOUNTER — Telehealth: Payer: Self-pay | Admitting: Cardiology

## 2021-08-21 ENCOUNTER — Telehealth: Payer: Self-pay | Admitting: *Deleted

## 2021-08-21 DIAGNOSIS — I493 Ventricular premature depolarization: Secondary | ICD-10-CM | POA: Diagnosis not present

## 2021-08-21 LAB — BASIC METABOLIC PANEL
BUN/Creatinine Ratio: 14 (ref 12–28)
BUN: 17 mg/dL (ref 8–27)
CO2: 23 mmol/L (ref 20–29)
Calcium: 9.7 mg/dL (ref 8.7–10.3)
Chloride: 110 mmol/L — ABNORMAL HIGH (ref 96–106)
Creatinine, Ser: 1.19 mg/dL — ABNORMAL HIGH (ref 0.57–1.00)
Glucose: 97 mg/dL (ref 70–99)
Potassium: 4.6 mmol/L (ref 3.5–5.2)
Sodium: 146 mmol/L — ABNORMAL HIGH (ref 134–144)
eGFR: 49 mL/min/{1.73_m2} — ABNORMAL LOW (ref >59)

## 2021-08-21 NOTE — Telephone Encounter (Signed)
Finis Bud, NP  [facility] Triage To whom it may concern,   I sent a message regarding patient's lab work last Friday (7/28) and to cancel her BMET and fasting lipid panel. It looks like her BMET for 7/31 was not canceled and resulted yesterday. Can we please cancel her fasting lipid panel for 8/11? I don't want her to get any unnecessary lab draws.    I wanted to follow up regarding the following:    Please call patient and verify what Lasix she is taking.  When I last saw her in the office on 7/19, we decided to have her take Lasix 40 mg for 3 days and then 20 mg daily.  Her kidney function was elevated and we held Lasix for a few days. When her kidney function normalized, I decided to let her take Lasix 40 mg for 2 days and then return to 20 mg daily.  Pershing Proud, RN noted she was tolerating 20 mg of Lasix daily on July 25th. Dr. Rolly Salter note from 7/26 stated she had not restarted Lasix? I will need to know what dosage of Lasix that she is taking and if she took 40 mg  daily X 2 doses.   4. Please remind her that she needs to continue to weigh herself daily and log this and let us know if she gains more than 3 lbs in one day or more than 5 lbs in one week. Continue to limit sodium intake to < 2 grams per day and limit fluid intake to < 2 liters per day or between 32-64 fluid ounces in one day. She will follow up with Dr. Marlou Porch on 08/31/21.   5.  I looked at her last labs from 7/26 with PCP and all looked stable; however BNP was mildly elevated and incidental finding of mildly elevated liver enzymes.  This could be due to liver congestion, fatty liver, or medications she is taking.  Please ask her if she has been taking any Tylenol or drinking alcohol recently, and please assess if she is having any symptoms such as abdominal pain, fatigue, jaundice, loss of appetite, nausea/vomiting, etc. We may need to repeat liver enzymes in 2 to 4 weeks.    Please let me know what she says.    Thanks so much!   Kind Regards,  Finis Bud, NP     Called patient back. Informed patient that her lab on 08/31/21 is being canceled, due to her PCP already getting her lipids done last week. Asked her again about taking the lasix. Patient stated she took Lasix 40 mg once (08/09/21), she did not like it. Patient stated after that she took Lasix 20 mg for a few days then stopped. Patient stated she feels that she does not need lasix, and she is not going to take it. Patient stated she received Finis Bud, NP message through [place] about keeping track of her weight, and her weight is fine. Patient stated she will continue to keep a log and bring it to her next visit. Patient stated that her liver enzymes have been a little off now and then. 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  Patient stated she never takes tylenol or drinks alcohol. Patient stated she feels fine, and she looks forward to seeing Dr. Marlou Porch on 08/31/21. Informed patient that the provider is just concerned, and if she needs [place] to give our office a call. Patient verbalized understanding.  [name], RN 08/21/2021 11:53 AM

## 2021-08-21 NOTE — Telephone Encounter (Signed)
  Thanks Sabrina Knight for the update about this patient!   Glad to hear her weight is doing well and she is following the CHF treatment plan as we discussed. Because she did not like 40 mg of Lasix, let's keep her on Lasix 20 mg PO daily PRN for worsening shortness of breath, weight gain of more than 3 lbs in one day or more than 5 lbs in one week, or for any sign of fluid overload. Her kidney function from yesterday is slightly up again showing her previous Lasix use, so we will just keep Lasix PRN for now. If she takes any more Lasix between now and when she sees Dr. Marlou Porch on 8/11, she needs to let us know and we may need to repeat another BMET for her appointment. I'll send her a MyChart message with this information.   I don't want her to get unnecessary blood draws.   I'll give Dr. Marlou Porch a heads up about all of this, including her liver function enzymes so he's aware when he sees her on 8/11 and I'll notify her about this too via [name].     Thanks again!   Kind Regards,  Sabrina Bud, NP   Called patient again with Sabrina Bud, NP's message as written above. Patient verbalized understanding.  [name], RN 08/21/2021 1:55 PM

## 2021-08-21 NOTE — Telephone Encounter (Signed)
Pt states she is returning a call and is requesting call back.

## 2021-08-21 NOTE — Telephone Encounter (Signed)
Spoke with patient and she is taking NONE of her furosemide. She states she has had no weight gain, no problems and weight has been the same for 2 weeks. She prefers not to take the furosemide and then discuss with Dr. Marlou Porch. Advised I would let provider know of her wishes. She was very appreciative for the call with no further questions at this time.

## 2021-08-21 NOTE — Telephone Encounter (Signed)
-----   Message from Finis Bud, NP sent at 08/17/2021  9:31 PM EDT ----- Regarding: Lab work Hello Pam,   Thank you for notifying me about this. I don't want her to get unnecessary blood draws.  1. Let's cancel the BMET for Monday (7/31) as I saw she just had labs checked with her PCP on 7/27.   2. Let's also cancel the fasting lipid panel for her on 8/11 with Dr. Marlou Porch as her PCP drew it yesterday on 7/27.  3.  Please call patient and verify what Lasix she is taking.  When I last saw her in the office, we decided to have her take Lasix 40 mg for 3 days and then 20 mg daily.  Her kidney function was elevated and we held Lasix for a few days. When her kidney function normalized, I decided to let her take Lasix 40 mg for 2 days and then return to 20 mg daily.  Pershing Proud, RN noted she was tolerating 20 mg of Lasix daily on July 25. Dr. Rolly Salter note from yesterday stated she had not restarted Lasix? I will need to know what dosage of Lasix that she is taking and if she took 40 mg  daily X 2 doses.  4. Please remind her that she needs to continue to weigh herself daily and log this and let us know if she gains more than 3 lbs in one day or more than 5 lbs in one week. Continue to limit sodium intake to < 2 grams per day and limit fluid intake to < 2 liters per day or between 32-64 fluid ounces in one day. She will follow up with Dr. Marlou Porch on 08/31/21.   5.  I looked at her last labs from yesterday with PCP and all looked stable; however BNP was mildly elevated and incidental finding of mildly elevated liver enzymes.  This could be due to liver congestion, fatty liver, or medications she is taking.  Please ask her if she has been taking any Tylenol or drinking alcohol recently, and please assess if she is having any symptoms such as abdominal pain, fatigue, jaundice, loss of appetite, nausea/vomiting, etc. We may need to repeat liver enzymes in 2 to 4 weeks.   Thanks so much!  Finis Bud,  NP

## 2021-08-27 ENCOUNTER — Telehealth: Payer: Self-pay

## 2021-08-27 MED ORDER — EMPAGLIFLOZIN 10 MG PO TABS: 10.0000 mg | ORAL_TABLET | Freq: Every day | ORAL | 3 refills | Status: DC

## 2021-08-27 NOTE — Telephone Encounter (Signed)
Left message for patient to call back  

## 2021-08-27 NOTE — Telephone Encounter (Signed)
-----   Message from Jerline Pain, MD sent at 08/27/2021  6:37 AM EDT ----- Nanine Means, I agree with the SGLT2. I'll get the ball rolling with that.  Let's go ahead and give her Jardience '10mg'$  once a day.  I'll have my team reach out to her.   Elta Guadeloupe ----- Message ----- From: Wendie Agreste, MD Sent: 08/26/2021   4:22 PM EDT To: Jerline Pain, MD  Thinking about adding an SGLT2 but wanted to get your opinion.  Looks like she has an appointment with you in a few days.  Thanks.  Janeann Forehand ----- Message ----- From: Interface, Lab In Three Zero One Sent: 08/16/2021  10:18 AM EDT To: Wendie Agreste, MD

## 2021-08-27 NOTE — Telephone Encounter (Signed)
Spoke with the patient and gave advisement from Dr. Marlou Porch. Prescription for jardiance 10 mg daily has been sent in.

## 2021-08-31 ENCOUNTER — Encounter: Payer: Self-pay | Admitting: Cardiology

## 2021-08-31 ENCOUNTER — Other Ambulatory Visit: Payer: Medicare HMO

## 2021-08-31 ENCOUNTER — Ambulatory Visit: Payer: Medicare HMO | Admitting: Cardiology

## 2021-08-31 VITALS — BP 118/72 | HR 70 | Ht 65.0 in | Wt 190.4 lb

## 2021-08-31 DIAGNOSIS — I5022 Chronic systolic (congestive) heart failure: Secondary | ICD-10-CM | POA: Diagnosis not present

## 2021-08-31 DIAGNOSIS — I428 Other cardiomyopathies: Secondary | ICD-10-CM | POA: Diagnosis not present

## 2021-08-31 DIAGNOSIS — E785 Hyperlipidemia, unspecified: Secondary | ICD-10-CM | POA: Diagnosis not present

## 2021-08-31 DIAGNOSIS — I493 Ventricular premature depolarization: Secondary | ICD-10-CM

## 2021-08-31 NOTE — Patient Instructions (Signed)
Medication Instructions:  The current medical regimen is effective;  continue present plan and medications.  *If you need a refill on your cardiac medications before your next appointment, please call your pharmacy*  Testing/Procedures: Your physician has requested that you have an echocardiogram in November. Echocardiography is a painless test that uses sound waves to create images of your heart. It provides your doctor with information about the size and shape of your heart and how well your heart's chambers and valves are working. This procedure takes approximately one hour. There are no restrictions for this procedure.   Follow-Up: At [facility], you and your health needs are our priority.  As part of our continuing mission to provide you with exceptional heart care, we have created designated Provider Care Teams.  These Care Teams include your primary Cardiologist (physician) and Advanced Practice Providers (APPs -  Physician Assistants and Nurse Practitioners) who all work together to provide you with the care you need, when you need it.  We recommend signing up for the patient portal called "MyChart".  Sign up information is provided on this After Visit Summary.  MyChart is used to connect with patients for Virtual Visits (Telemedicine).  Patients are able to view lab/test results, encounter notes, upcoming appointments, etc.  Non-urgent messages can be sent to your provider as well.   To learn more about what you can do with MyChart, go to [email or web address].    Your next appointment:   3 month(s)  The format for your next appointment:   In Person  Provider:   Robbie Lis, PA-C, Nicholes Rough, PA-C, Ermalinda Barrios, PA-C, Christen Bame, NP, or Richardson Dopp, PA-C     {   Important Information About Sugar

## 2021-08-31 NOTE — Progress Notes (Signed)
Cardiology Office Note:    Date:  08/31/2021   ID:  Sabrina Knight, DOB 1950/11/09, MRN [id number]  PCP:  Wendie Agreste, MD   [facility] Providers Cardiologist:  Candee Furbish, MD     Referring MD: Wendie Agreste, MD    History of Present Illness:    Sabrina Knight is a 71 y.o. female here for follow-up s/p 6 weeks since start of Entresto.  She was seen by Finis Bud, NP on 08/08/2021, one week after starting Entresto. She complained of increased DOE, heaviness in her arms, and feeling lethargic. She had taken Lasix that morning, but it didn't seem to help; she did not believe she was urinating as much as she should. At the time of that visit she had stopped taking her Losartan. Her Lasix was increased to 40 mg PO for three days, and then continue 20 mg Lasix daily.   She underwent left heart catheterization 07/19/2021 revealing no angiographic evidence of CAD and global LV systolic dysfunction. LVEDP was 16-20 mmHg. LVEF 35-45% by visual estimate. Then had an Echo 08/03/2021 revealing LVEF 30-35%. She was started on Entresto 24/26 mg BID. She also completed a heart monitor showing an average heart rate 74 bpm with sinus rhythm first-degree AV block, and frequent PVCs (5.3%). No atrial fibrillation present, no ventricular tachycardia, no pauses.  Previously here for the evaluation of shortness of breath, chest pain, ventricular bigeminy on ECG with left bundle branch block underlying.  I discussed the case with Dr. Carlota Raspberry referring provider.  Had been noticing more shortness of breath when trying to exercise.  Usually she does water aerobics at the [facility].  Has both asthma and allergies as well and uses Pulmicort.  It was described that something had been different over the prior few days however.  She felt tired to work out; much more dyspnea. She also felt chest tightness, more windedness, and heaviness in the shoulders and back.  She felt as though she needed to cut her workout  short.  She even felt shortness of breath when walking at [place]. No real radiation to the arms and neck.  Felt more fatigue in general.  Did not really see any change with albuterol or Pulmicort. Had a negative COVID test approximately 1 week prior to that visit.  There is coronary disease history/CABG in one of her brothers and another with atrial fibrillation as well.  No recent prolonged car or air travel.  No thrombotic history.  She has had trouble with statin intolerance in the past.  Has had lisinopril cough.  Currently taking Crestor 10 mg 5 days/week.  Tolerating.  Used to smoke; quit in 2005.  Today: This morning she was feeling a little lightheaded, but she attributes this to having more activities to complete than usual. Her blood pressure was initially 88/69 this morning; she tried again 15 minutes later and her BP was 90 systolic. She endorses average readings closer to 622 systolic.  At this time her main concern is if she is ready to return to formal exercise. We discussed this and reviewed her current medications.  Her sleeping has improved significantly, and she will take naps in the afternoons as needed.  She denies any palpitations, chest pain, shortness of breath, or peripheral edema. No headaches, syncope, orthopnea, or PND.   Past Medical History:  Diagnosis Date   Allergy    Zyrtec, Singulair; allergy testing negative; Orvil Feil.   Arthritis    Asthma due to seasonal allergies  Cataract    Clotting disorder ([place])    Glaucoma    Gout    ITP (idiopathic thrombocytopenic purpura)     Past Surgical History:  Procedure Laterality Date   ABDOMINAL HYSTERECTOMY  01/22/1999   ovaries resected; DUB/fibroids   CARPAL TUNNEL RELEASE Right 09/11/2018   Procedure: CARPAL TUNNEL RELEASE;  Surgeon: Daryll Brod, MD;  Location: [facility];  Service: Orthopedics;  Laterality: Right;   CARPAL TUNNEL RELEASE Left 10/02/2018   Procedure: CARPAL TUNNEL RELEASE;   Surgeon: Daryll Brod, MD;  Location: [facility];  Service: Orthopedics;  Laterality: Left;  FAB   EYE SURGERY N/A    [employer] 06/26/2019   LEFT HEART CATH AND CORONARY ANGIOGRAPHY N/A 07/19/2021   Procedure: LEFT HEART CATH AND CORONARY ANGIOGRAPHY;  Surgeon: Burnell Blanks, MD;  Location: [facility];  Service: Cardiovascular;  Laterality: N/A;   spleen removal  01/22/1995   for ITP   spleen removal      Current Medications: Current Meds  Medication Sig   allopurinol (ZYLOPRIM) 100 MG tablet Take 1 tablet (100 mg total) by mouth 2 (two) times daily.   aspirin EC 81 MG tablet Take 81 mg by mouth daily. Swallow whole.   Biotin 1 MG CAPS Take 1 mg by mouth daily.   cetirizine (ZYRTEC) 10 MG tablet Take 10 mg by mouth daily.   co-enzyme Q-10 30 MG capsule Take 30 mg by mouth daily.   empagliflozin (JARDIANCE) 10 MG TABS tablet Take 1 tablet (10 mg total) by mouth daily before breakfast.   Glycerin-Polysorbate 80 (REFRESH DRY EYE THERAPY OP) Place 1 drop into both eyes daily as needed (Dry eyes).   Krill Oil 300 MG CAPS Take 300 mg by mouth daily.   magnesium gluconate (MAGONATE) 500 MG tablet Take 500 mg by mouth at bedtime.   metoprolol succinate (TOPROL XL) 25 MG 24 hr tablet Take 1 tablet (25 mg total) by mouth daily.   montelukast (SINGULAIR) 10 MG tablet Take 1 tablet (10 mg total) by mouth daily. (Patient taking differently: Take 10 mg by mouth at bedtime.)   Multiple Vitamin (MULTIVITAMIN WITH MINERALS) TABS tablet Take 1 tablet by mouth daily. Woman   rosuvastatin (CRESTOR) 10 MG tablet TAKE 1 TAB BY MOUTH DAILY IN THE EVENING 5 DAYS PER WEEK.   sacubitril-valsartan (ENTRESTO) 24-26 MG Take 1 tablet by mouth 2 (two) times daily.   SYMBICORT 80-4.5 MCG/ACT inhaler Inhale 2 puffs into the lungs as needed (Shortness of breath).   Tart Cherry 1200 MG CAPS Take 1,200 mg by mouth at bedtime.   VITAMIN D, CHOLECALCIFEROL, PO Take 500 mg by mouth daily.    vitamin E 180 MG (400 UNITS) capsule Take 400 Units by mouth daily.     Allergies:   Lisinopril   Social History   Socioeconomic History   Marital status: Widowed    Spouse name: Not on file   Number of children: 0   Years of education: Not on file   Highest education level: Not on file  Occupational History   Occupation: Retired  Tobacco Use   Smoking status: Former    Packs/day: 0.50    Types: Cigarettes    Quit date: 12/04/2003    Years since quitting: 17.7   Smokeless tobacco: Never  Vaping Use   Vaping Use: Never used  Substance and Sexual Activity   Alcohol use: No    Alcohol/week: 0.0 standard drinks of alcohol   Drug  use: No   Sexual activity: Not Currently    Birth control/protection: Surgical  Other Topics Concern   Not on file  Social History Narrative   Marital status:  Widowed x since 2001 from [place]; married x 27 years.  Not dating really in 2019.      Children:  None; failed in vitro.      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      Tobacco: quit ; smoked 20 years.      Alcohol: none      Education: [employer].       Exercise: Yes; 3 times per week 2 hours deep water aerobics.      ADLs: independent ADLs; no assistant devices.      Advanced Directives:  None; FULL CODE; no prolonged measures.    HCPOA: Sabrina Knight/sister-in-law.     Social Determinants of Health   Financial Resource Strain: Low Risk  (04/26/2021)   Overall Financial Resource Strain (CARDIA)    Difficulty of Paying Living Expenses: Not hard at all  Food Insecurity: No Food Insecurity (04/26/2021)   Hunger Vital Sign    Worried About Running Out of Food in the Last Year: Never true    Ran Out of Food in the Last Year: Never true  Transportation Needs: No Transportation Needs (04/26/2021)   PRAPARE - Hydrologist (Medical): No    Lack of Transportation (Non-Medical): No  Physical Activity: Sufficiently Active  (04/26/2021)   Exercise Vital Sign    Days of Exercise per Week: 3 days    Minutes of Exercise per Session: 60 min  Stress: No Stress Concern Present (04/26/2021)   [place]    Feeling of Stress : Not at all  Social Connections: Moderately Isolated (04/26/2021)   Social Connection and Isolation Panel [NHANES]    Frequency of Communication with Friends and Family: Twice a week    Frequency of Social Gatherings with Friends and Family: Twice a week    Attends Religious Services: 1 to 4 times per year    Active Member of [employer] or Organizations: No    Attends Archivist Meetings: Never    Marital Status: Widowed     Family History: The patient's family history includes COPD in her father; Cancer (age of onset: 76) in her brother; Cancer (age of onset: 64) in her mother; Diabetes in her brother, brother, brother, and sister; Heart disease (age of onset: 52) in her brother; Hyperlipidemia in her mother; Hypertension in her brother, brother, brother, brother, mother, sister, and sister; Mental illness in her mother.  ROS:   Please see the history of present illness.    (+) Lightheadedness All other systems reviewed and are negative.  EKGs/Labs/Other Studies Reviewed:    The following studies were reviewed today: Prior office records reviewed  Monitor 08/2021:   Average heart rate 74 bpm with sinus rhythm first-degree AV block.   Interventricular conduction delay present.   Rare atrial tachycardia   Rare Wenkebach   Rare PACs   Frequent PVCs 5.3%   No atrial fibrillation present, no ventricular tachycardia.  No pauses.   Findings discussed during office visit.     Patch Wear Time:  10 days and 14 hours (2023-07-14T09:24:37-0400 to 2023-07-24T23:49:25-0400)   Patient had a min HR of 21 bpm, max HR of 187 bpm, and avg HR of 74 bpm.  Predominant underlying rhythm was Sinus Rhythm. First Degree AV Block was present.  Bundle Branch Block/IVCD was present. 4 Supraventricular Tachycardia runs occurred, the run with  the fastest interval lasting 5 beats with a max rate of 187 bpm (avg 163 bpm); the run with the fastest interval was also the longest. Second Degree AV Block-Mobitz I (Wenckebach) was present. Isolated SVEs were rare (<1.0%), SVE Couplets were rare  (<1.0%), and SVE Triplets were rare (<1.0%). Isolated VEs were frequent (5.3%, 59600), VE Couplets were rare (<1.0%, 179), and no VE Triplets were present. Ventricular Bigeminy and Trigeminy were present.  Echo  08/03/2021: Sonographer Comments: Global longitudinal strain was attempted.  IMPRESSIONS    1. Left ventricular ejection fraction, by estimation, is 30 to 35%. The  left ventricle has moderately decreased function. The left ventricle  demonstrates global hypokinesis. The left ventricular internal cavity size  was mildly dilated. There is moderate   left ventricular hypertrophy. Left ventricular diastolic parameters are  consistent with Grade II diastolic dysfunction (pseudonormalization).  Elevated left atrial pressure.   2. Right ventricular systolic function is normal. The right ventricular  size is normal. Tricuspid regurgitation signal is inadequate for assessing  PA pressure.   3. The mitral valve is normal in structure. Mild mitral valve  regurgitation.   4. The aortic valve is tricuspid. Aortic valve regurgitation is trivial.  Aortic valve sclerosis is present, with no evidence of aortic valve  stenosis.   5. The inferior vena cava is normal in size with greater than 50%  respiratory variability, suggesting right atrial pressure of 3 mmHg.  Left Heart Cath  07/19/2021:   There is moderate left ventricular systolic dysfunction.   LV end diastolic pressure is mildly elevated.   The left ventricular ejection fraction is 35-45% by visual estimate.   There is no mitral valve regurgitation.   No angiographic evidence of CAD.  Global LV  systolic dysfunction.  LVEDP=16-20 mmHg   Recommendations: She will have an outpatient echo for further assessment of LV function. Her LV systolic function appears depressed on the LV gram. She is also having frequent PVCs which may have contributed to her cardiomyopathy. I will review with Dr. Marlou Porch. She may benefit from daily Lasix.    EKG:  EKG is personally reviewed. 08/31/2021:  EKG was not ordered. 07/18/2021: sinus rhythm left bundle branch block 78 bpm with ventricular bigeminy PVCs frequent.  Recent Labs: 08/08/2021: Hemoglobin 15.4; Platelets 415.0 08/16/2021: ALT 47; Magnesium 1.9; Pro B Natriuretic peptide (BNP) 159.0; TSH 1.17 08/20/2021: BUN 17; Creatinine, Ser 1.19; Potassium 4.6; Sodium 146   Recent Lipid Panel    Component Value Date/Time   CHOL 169 08/16/2021 0812   CHOL 204 (H) 12/29/2019 1147   TRIG 236.0 (H) 08/16/2021 0812   HDL 41.20 08/16/2021 0812   HDL 52 12/29/2019 1147   CHOLHDL 4 08/16/2021 0812   VLDL 47.2 (H) 08/16/2021 0812   LDLCALC 71 02/07/2021 1125   LDLCALC 120 (H) 12/29/2019 1147   LDLDIRECT 91.0 08/16/2021 0812     Risk Assessment/Calculations:              Physical Exam:    VS:  BP 118/72   Pulse 70   Ht '5\' 5"'$  (1.651 m)   Wt 190 lb 6.4 oz (86.4 kg)   SpO2 96%   BMI 31.68 kg/m     Wt Readings from Last 3 Encounters:  08/31/21 190 lb 6.4 oz (86.4 kg)  08/15/21 192 lb 12.8 oz (87.5  kg)  08/08/21 192 lb 1 oz (87.1 kg)     GEN:  Well nourished, well developed in no acute distress HEENT: Normal NECK: No JVD; No carotid bruits LYMPHATICS: No lymphadenopathy CARDIAC: RRR with frequent ectopy, no murmurs, no rubs, gallops RESPIRATORY:  Clear to auscultation without rales, wheezing or rhonchi  ABDOMEN: Soft, non-tender, non-distended MUSCULOSKELETAL:  No edema; No deformity  SKIN: Warm and dry NEUROLOGIC:  Alert and oriented x 3 PSYCHIATRIC:  Normal affect   ASSESSMENT:    1. Chronic systolic heart failure ([place])   2.  Non-ischemic cardiomyopathy ([place])   3. Hyperlipidemia, unspecified hyperlipidemia type   4. PVC's (premature ventricular contractions)    PLAN:    In order of problems listed above: Chronic systolic heart failure secondary to nonischemic cardiomyopathy ejection fraction 30 to 35% - On goal-directed medical therapy.  Continue with low-dose Entresto 24/26, Jardiance, low-dose Toprol given her Wenkebach phenomenon seen on monitor.  Blood pressure this morning was quite low 17B to 90 systolic.  She did feel somewhat woozy.  This resolved.  Continue to watch.  She can always bring her monitor in for accuracy.  She showed me some further blood pressure readings and they did correlate with what we got today. - In 3 months, we will repeat echocardiogram.  Lets see if pump function improves. - I do not want to push up her Toprol because of Wenkebach -Creatinine 1.19 potassium 4.6 ALT slightly increased 47 BNP 159.  TSH normal 1.17   PVC's (premature ventricular contractions) -5% PVCs are noted on monitor.  This is not high enough to cause cardiomyopathy.  Continue with low-dose Toprol.   High cholesterol LDL currently 91 Crestor 5 mg.  Had trouble with higher dosing.  No changes made.    Follow-up:  In 3 months with APP following repeat Echo.  Medication Adjustments/Labs and Tests Ordered: Current medicines are reviewed at length with the patient today.  Concerns regarding medicines are outlined above.   Orders Placed This Encounter  Procedures   ECHOCARDIOGRAM COMPLETE   No orders of the defined types were placed in this encounter.  Patient Instructions  Medication Instructions:  The current medical regimen is effective;  continue present plan and medications.  *If you need a refill on your cardiac medications before your next appointment, please call your pharmacy*  Testing/Procedures: Your physician has requested that you have an echocardiogram in November. Echocardiography is a  painless test that uses sound waves to create images of your heart. It provides your doctor with information about the size and shape of your heart and how well your heart's chambers and valves are working. This procedure takes approximately one hour. There are no restrictions for this procedure.   Follow-Up: At [facility], you and your health needs are our priority.  As part of our continuing mission to provide you with exceptional heart care, we have created designated Provider Care Teams.  These Care Teams include your primary Cardiologist (physician) and Advanced Practice Providers (APPs -  Physician Assistants and Nurse Practitioners) who all work together to provide you with the care you need, when you need it.  We recommend signing up for the patient portal called "MyChart".  Sign up information is provided on this After Visit Summary.  MyChart is used to connect with patients for Virtual Visits (Telemedicine).  Patients are able to view lab/test results, encounter notes, upcoming appointments, etc.  Non-urgent messages can be sent to your provider as well.   To learn  more about what you can do with MyChart, go to [email or web address].    Your next appointment:   3 month(s)  The format for your next appointment:   In Person  Provider:   Robbie Lis, PA-C, Nicholes Rough, PA-C, Ermalinda Barrios, PA-C, Christen Bame, NP, or Richardson Dopp, PA-C     {   Important Information About Sugar         I,Mathew Stumpf,acting as a scribe for Candee Furbish, MD.,have documented all relevant documentation on the behalf of Candee Furbish, MD,as directed by  Candee Furbish, MD while in the presence of Candee Furbish, MD.  I, Candee Furbish, MD, have reviewed all documentation for this visit. The documentation on 08/31/21 for the exam, diagnosis, procedures, and orders are all accurate and complete.   Signed, Candee Furbish, MD  08/31/2021 9:11 AM    [facility]

## 2021-09-30 ENCOUNTER — Encounter: Payer: Self-pay | Admitting: Cardiology

## 2021-09-30 ENCOUNTER — Encounter: Payer: Self-pay | Admitting: Family Medicine

## 2021-10-01 ENCOUNTER — Other Ambulatory Visit: Payer: Self-pay | Admitting: Cardiology

## 2021-10-01 NOTE — Telephone Encounter (Signed)
Pt reports some urinary symptoms that she wanted to ask about due to possible Jardiance side effects.  Urgency, frequency, small output when urinating, abdominal pressure.   Please advise

## 2021-10-02 ENCOUNTER — Encounter: Payer: Self-pay | Admitting: Cardiology

## 2021-10-02 MED ORDER — ENTRESTO 24-26 MG PO TABS: 1.0000 | ORAL_TABLET | Freq: Two times a day (BID) | ORAL | 3 refills | Status: DC

## 2021-10-02 NOTE — Telephone Encounter (Signed)
Her symptoms certainly could be a urinary tract infection.  Please try to schedule a visit for her today or tomorrow if possible.  Jardiance and other medications in that class can increase the risk of urinary tract infections or fungal infections, but at this point need to make sure she does not have an infection and treat appropriately if so.  Thanks

## 2021-10-02 NOTE — Telephone Encounter (Signed)
Called pt and scheduled same day with Dr Birdie Riddle for assessment

## 2021-10-03 ENCOUNTER — Encounter: Payer: Self-pay | Admitting: Family Medicine

## 2021-10-03 ENCOUNTER — Other Ambulatory Visit: Payer: Self-pay

## 2021-10-03 ENCOUNTER — Ambulatory Visit (INDEPENDENT_AMBULATORY_CARE_PROVIDER_SITE_OTHER): Payer: Medicare HMO | Admitting: Family Medicine

## 2021-10-03 VITALS — BP 114/72 | HR 84 | Temp 98.2°F | Resp 17 | Ht 65.0 in | Wt 193.6 lb

## 2021-10-03 DIAGNOSIS — N390 Urinary tract infection, site not specified: Secondary | ICD-10-CM | POA: Diagnosis not present

## 2021-10-03 DIAGNOSIS — R350 Frequency of micturition: Secondary | ICD-10-CM | POA: Diagnosis not present

## 2021-10-03 DIAGNOSIS — Z23 Encounter for immunization: Secondary | ICD-10-CM | POA: Diagnosis not present

## 2021-10-03 LAB — POCT URINALYSIS DIP (MANUAL ENTRY)
Bilirubin, UA: NEGATIVE
Blood, UA: NEGATIVE
Glucose, UA: 500 mg/dL — AB
Ketones, POC UA: NEGATIVE mg/dL
Nitrite, UA: NEGATIVE
Protein Ur, POC: NEGATIVE mg/dL
Spec Grav, UA: 1.01 (ref 1.010–1.025)
Urobilinogen, UA: 0.2 E.U./dL
pH, UA: 5 (ref 5.0–8.0)

## 2021-10-03 MED ORDER — CEPHALEXIN 500 MG PO CAPS: 500.0000 mg | ORAL_CAPSULE | Freq: Two times a day (BID) | ORAL | 0 refills | Status: AC

## 2021-10-03 NOTE — Patient Instructions (Signed)
Follow up as needed or as scheduled w/ Dr Carver Fila notify you of your urine culture and make any changes if needed Start the Cephalexin twice daily- take w/ food- for possible infection Drink LOTS of water to flush out the bladder Call with any questions or concerns Hang in there!

## 2021-10-03 NOTE — Progress Notes (Signed)
   Subjective:    Patient ID: EGYPT WELCOME, female    DOB: [birth year], 71 y.o.   MRN: [id number]  HPI Urinary frequency- pt reports she has not had a UTI is 'over 78 yrs'.  Was recently started on Jardiance- 3 weeks ago.  Has sense of frequency, urgency, and a pinching feeling at the end of urination.  If having episodes where she feels the need to rush to the bathroom but very little comes out.  Sxs started ~1 week ago.  No fevers or chills.  + suprapubic pressure.   Review of Systems For ROS see HPI     Objective:   Physical Exam Vitals reviewed.  Constitutional:      General: She is not in acute distress.    Appearance: Normal appearance. She is well-developed. She is not ill-appearing.  Abdominal:     General: There is no distension.     Palpations: Abdomen is soft.     Tenderness: There is abdominal tenderness (+ suprapubic TTP but no CVA tenderness).  Skin:    General: Skin is warm and dry.  Neurological:     General: No focal deficit present.     Mental Status: She is alert and oriented to person, place, and time.  Psychiatric:        Mood and Affect: Mood normal.        Behavior: Behavior normal.        Thought Content: Thought content normal.           Assessment & Plan:  Urinary frequency- new.  Pt also w/ urgency, suprapubic pressure and a pinching sensation at the end of urination.  Likely due to recent initiation of Jardiance.  Told her that this could be her body adjusting to the new medication and does not mean that she's intolerant to medication.  If this were to become a pattern that is something worth discussing.  Start Keflex '500mg'$  BID while awaiting urine culture.  Pt expressed understanding and is in agreement w/ plan.

## 2021-10-05 LAB — URINE CULTURE
MICRO NUMBER:: 13912874
Result:: NO GROWTH
SPECIMEN QUALITY:: ADEQUATE

## 2021-10-10 ENCOUNTER — Encounter (INDEPENDENT_AMBULATORY_CARE_PROVIDER_SITE_OTHER): Payer: Medicare HMO | Admitting: Cardiology

## 2021-10-10 DIAGNOSIS — R0602 Shortness of breath: Secondary | ICD-10-CM | POA: Diagnosis not present

## 2021-10-10 MED ORDER — LOSARTAN POTASSIUM 25 MG PO TABS: 25.0000 mg | ORAL_TABLET | Freq: Every day | ORAL | 3 refills | Status: DC

## 2021-10-10 NOTE — Telephone Encounter (Signed)

## 2021-10-16 ENCOUNTER — Telehealth: Payer: Self-pay | Admitting: Family Medicine

## 2021-10-16 NOTE — Telephone Encounter (Signed)
This essentially translates that the patient is due for a follow up if we can get her scheduled and then we can do that repeat A1c

## 2021-10-16 NOTE — Telephone Encounter (Signed)
scheduled

## 2021-10-16 NOTE — Telephone Encounter (Signed)
Pt called b/c she got an automated text about getting her A1C checked: Sabrina Knight and I both reviewed chart and do not see where this was ordered. Please advise whether pt needs to come in to have this checked.

## 2021-10-16 NOTE — Telephone Encounter (Signed)
Chart reviewed, last visit with me was in July with 89-monthfollow-up planned depending on labs.  A1c 7.6 on July 27, uncontrolled, started on Jardiance in August.  Follow-up anytime after October 27 for repeat visit to discuss Jardiance, A1c and potential side effects with Jardiance.  Thanks

## 2021-10-26 ENCOUNTER — Ambulatory Visit (INDEPENDENT_AMBULATORY_CARE_PROVIDER_SITE_OTHER): Payer: Medicare HMO | Admitting: Family Medicine

## 2021-10-26 ENCOUNTER — Encounter: Payer: Self-pay | Admitting: Family Medicine

## 2021-10-26 VITALS — BP 132/80 | HR 51 | Temp 98.1°F | Ht 65.0 in | Wt 195.2 lb

## 2021-10-26 DIAGNOSIS — E1129 Type 2 diabetes mellitus with other diabetic kidney complication: Secondary | ICD-10-CM

## 2021-10-26 DIAGNOSIS — R5383 Other fatigue: Secondary | ICD-10-CM

## 2021-10-26 DIAGNOSIS — R001 Bradycardia, unspecified: Secondary | ICD-10-CM | POA: Diagnosis not present

## 2021-10-26 DIAGNOSIS — I5020 Unspecified systolic (congestive) heart failure: Secondary | ICD-10-CM

## 2021-10-26 DIAGNOSIS — R809 Proteinuria, unspecified: Secondary | ICD-10-CM | POA: Diagnosis not present

## 2021-10-26 LAB — GLUCOSE, POCT (MANUAL RESULT ENTRY): POC Glucose: 94 mg/dl (ref 70–99)

## 2021-10-26 MED ORDER — BLOOD GLUCOSE METER KIT: PACK | 0 refills | Status: AC

## 2021-10-26 NOTE — Progress Notes (Signed)
Subjective:  Patient ID: Sabrina Knight, female    DOB: [birth year]  Age: 71 y.o. MRN: [id number]  CC:  Chief Complaint  Patient presents with   Diabetes    Pt states all is well    HPI Sabrina Knight presents for   Diabetes: With microalbuminuria, previous diet controlled but A1c did increase in July from 7.2-7.6.  She is on statin, ARB.  now on Jardiance 10 mg daily since August.  Sabrina Knight was cost prohibitive.  Losartan 25 mg daily. Phone note reviewed from September 10, concern for possible UTI symptoms on the Jardiance.  Seen by my colleague on September 13, started on Keflex 500 mg twice daily initially, urinalysis with glucose 500, negative nitrite, trace LE, urine culture negative/no growth. Doing better -  only episodic burning that quickly resolves, .drinking more water. No vaginal discharge or itching.  Home readings none.  Symptomatic lows - no symptoms.   Microalbumin: Normal testing on 08/16/2021. Optho, foot exam, pneumovax: To date  Plans on covid and RSV vaccine at pharmacy today.    Lab Results  Component Value Date   HGBA1C 7.6 (H) 08/16/2021   HGBA1C 7.2 (H) 02/07/2021   HGBA1C 7.0 (H) 06/29/2020   Lab Results  Component Value Date   MICROALBUR 1.8 08/16/2021   LDLCALC 71 02/07/2021   CREATININE 1.19 (H) 08/20/2021   Bradycardia Low heart rate noted today.  Pulse 84 at her last visit with Dr. Birdie Riddle.  70 at visit with Dr. Marlou Porch on 8/11.  Previous heart monitor in August indicated average heart rate 74 with first-degree AV block, frequent PVCs.  No atrial fibrillation, ventricular tachycardia or pauses.  No heart palpitations or chest pain. Some fatigue - exhausted all the time. No recent changes. On low dose toprol, hx of her Wenkebach phenomenon seen on monitor.   History Patient Active Problem List   Diagnosis Date Noted   Shortness of breath    Precordial chest pain 07/18/2021   PVC's (premature ventricular contractions) 07/18/2021    Degenerative disc disease, lumbar 07/12/2020   Degenerative arthritis of left knee 05/26/2020   Greater trochanteric bursitis of both hips 05/26/2020   Mild persistent asthma, uncomplicated 18/84/1660   Vasomotor rhinitis 01/08/2019   Other allergic rhinitis 01/08/2019   Bilateral carpal tunnel syndrome 09/25/2018   Dyslipidemia 10/22/2017   Statin intolerance 10/22/2017   Newly diagnosed diabetes ([place]) 10/22/2017   Gout 12/23/2016   Class 1 obesity due to excess calories with serious comorbidity and body mass index (BMI) of 31.0 to 31.9 in adult 05/19/2016   Osteopenia of necks of both femurs 05/19/2016   Thrombocytosis 02/08/2014   High cholesterol 02/01/2013   H/O splenectomy 02/01/2013   Non-allergic rhinitis 02/01/2013   Glaucoma 02/01/2013   Past Medical History:  Diagnosis Date   Allergy    Zyrtec, Singulair; allergy testing negative; Orvil Feil.   Arthritis    Asthma due to seasonal allergies    Cataract    Clotting disorder ([place])    Glaucoma    Gout    ITP (idiopathic thrombocytopenic purpura)    Past Surgical History:  Procedure Laterality Date   ABDOMINAL HYSTERECTOMY  01/22/1999   ovaries resected; DUB/fibroids   CARPAL TUNNEL RELEASE Right 09/11/2018   Procedure: CARPAL TUNNEL RELEASE;  Surgeon: Daryll Brod, MD;  Location: [facility];  Service: Orthopedics;  Laterality: Right;   CARPAL TUNNEL RELEASE Left 10/02/2018   Procedure: CARPAL TUNNEL RELEASE;  Surgeon: Daryll Brod, MD;  Location: [facility];  Service: Orthopedics;  Laterality: Left;  FAB   EYE SURGERY N/A    [facility] 06/26/2019   LEFT HEART CATH AND CORONARY ANGIOGRAPHY N/A 07/19/2021   Procedure: LEFT HEART CATH AND CORONARY ANGIOGRAPHY;  Surgeon: Burnell Blanks, MD;  Location: [facility];  Service: Cardiovascular;  Laterality: N/A;   spleen removal  01/22/1995   for ITP   spleen removal     Allergies  Allergen Reactions   Lisinopril Cough   Prior to  Admission medications   Medication Sig Start Date End Date Taking? Authorizing Provider  allopurinol (ZYLOPRIM) 100 MG tablet Take 1 tablet (100 mg total) by mouth 2 (two) times daily. 08/15/21  Yes Wendie Agreste, MD  aspirin EC 81 MG tablet Take 81 mg by mouth daily. Swallow whole.   Yes [provider]  cetirizine (ZYRTEC) 10 MG tablet Take 10 mg by mouth daily.   Yes [provider]  co-enzyme Q-10 30 MG capsule Take 30 mg by mouth daily.   Yes [provider]  empagliflozin (JARDIANCE) 10 MG TABS tablet Take 1 tablet (10 mg total) by mouth daily before breakfast. 08/27/21  Yes Jerline Pain, MD  Glycerin-Polysorbate 80 (REFRESH DRY EYE THERAPY OP) Place 1 drop into both eyes daily as needed (Dry eyes).   Yes [provider]  Javier Docker Oil 300 MG CAPS Take 300 mg by mouth daily.   Yes [provider]  losartan (COZAAR) 25 MG tablet Take 1 tablet (25 mg total) by mouth daily. 10/10/21  Yes Jerline Pain, MD  magnesium gluconate (MAGONATE) 500 MG tablet Take 500 mg by mouth at bedtime.   Yes [provider]  metoprolol succinate (TOPROL XL) 25 MG 24 hr tablet Take 1 tablet (25 mg total) by mouth daily. 07/20/21  Yes Jerline Pain, MD  montelukast (SINGULAIR) 10 MG tablet Take 1 tablet (10 mg total) by mouth daily. Patient taking differently: Take 10 mg by mouth at bedtime. 02/07/21  Yes Wendie Agreste, MD  Multiple Vitamin (MULTIVITAMIN WITH MINERALS) TABS tablet Take 1 tablet by mouth daily. Woman   Yes [provider]  rosuvastatin (CRESTOR) 10 MG tablet TAKE 1 TAB BY MOUTH DAILY IN THE EVENING 5 DAYS PER WEEK. 08/15/21  Yes Wendie Agreste, MD  SYMBICORT 80-4.5 MCG/ACT inhaler Inhale 2 puffs into the lungs as needed (Shortness of breath). 06/30/21  Yes [provider]  Tart Cherry 1200 MG CAPS Take 1,200 mg by mouth at bedtime.   Yes [provider]  VITAMIN D, CHOLECALCIFEROL, PO Take 500 mg by mouth daily.   Yes  [provider]  vitamin E 180 MG (400 UNITS) capsule Take 400 Units by mouth daily.   Yes [provider]  Biotin 1 MG CAPS Take 1 mg by mouth daily. Patient not taking: Reported on 10/26/2021    [provider]  budesonide-formoterol (SYMBICORT) 80-4.5 MCG/ACT inhaler Inhale 2 puffs into the lungs 2 (two) times daily.    [provider]   Social History   Socioeconomic History   Marital status: Widowed    Spouse name: Not on file   Number of children: 0   Years of education: Not on file   Highest education level: Not on file  Occupational History   Occupation: Retired  Tobacco Use   Smoking status: Former    Packs/day: 0.50    Types: Cigarettes    Quit date: 12/04/2003    Years  since quitting: 17.9   Smokeless tobacco: Never  Vaping Use   Vaping Use: Never used  Substance and Sexual Activity   Alcohol use: No    Alcohol/week: 0.0 standard drinks of alcohol   Drug use: No   Sexual activity: Not Currently    Birth control/protection: Surgical  Other Topics Concern   Not on file  Social History Narrative   Marital status:  Widowed x since 2001 from [place]; married x 27 years.  Not dating really in 2019.      Children:  None; failed in vitro.      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      Tobacco: quit ; smoked 20 years.      Alcohol: none      Education: [employer].       Exercise: Yes; 3 times per week 2 hours deep water aerobics.      ADLs: independent ADLs; no assistant devices.      Advanced Directives:  None; FULL CODE; no prolonged measures.    HCPOA: Darlene Jackson/sister-in-law.     Social Determinants of Health   Financial Resource Strain: Low Risk  (04/26/2021)   Overall Financial Resource Strain (CARDIA)    Difficulty of Paying Living Expenses: Not hard at all  Food Insecurity: No Food Insecurity (04/26/2021)   Hunger Vital Sign    Worried About Running Out of Food in the Last  Year: Never true    Ran Out of Food in the Last Year: Never true  Transportation Needs: No Transportation Needs (04/26/2021)   PRAPARE - Hydrologist (Medical): No    Lack of Transportation (Non-Medical): No  Physical Activity: Sufficiently Active (04/26/2021)   Exercise Vital Sign    Days of Exercise per Week: 3 days    Minutes of Exercise per Session: 60 min  Stress: No Stress Concern Present (04/26/2021)   [place]    Feeling of Stress : Not at all  Social Connections: Moderately Isolated (04/26/2021)   Social Connection and Isolation Panel [NHANES]    Frequency of Communication with Friends and Family: Twice a week    Frequency of Social Gatherings with Friends and Family: Twice a week    Attends Religious Services: 1 to 4 times per year    Active Member of [employer] or Organizations: No    Attends Archivist Meetings: Never    Marital Status: Widowed  Intimate Partner Violence: Not At Risk (04/26/2021)   Humiliation, Afraid, Rape, and Kick questionnaire    Fear of Current or Ex-Partner: No    Emotionally Abused: No    Physically Abused: No    Sexually Abused: No    Review of Systems   Objective:   Vitals:   10/26/21 0915  BP: 132/80  Pulse: (!) 51  Temp: 98.1 F (36.7 C)  SpO2: 96%  Weight: 195 lb 3.2 oz (88.5 kg)  Height: '5\' 5"'$  (1.651 m)     Physical Exam Vitals reviewed.  Constitutional:      Appearance: Normal appearance. She is well-developed.  HENT:     Head: Normocephalic and atraumatic.  Eyes:     Conjunctiva/sclera: Conjunctivae normal.     Pupils: Pupils are equal, round, and reactive to light.  Neck:     Vascular: No carotid bruit.  Cardiovascular:     Rate and Rhythm: Regular rhythm. Bradycardia present.  Heart sounds: Normal heart sounds.     Comments: Distant, bradycardic. No ectopic beats appreciated.  Pulmonary:     Effort: Pulmonary effort  is normal.     Breath sounds: Normal breath sounds.  Abdominal:     Palpations: Abdomen is soft. There is no pulsatile mass.     Tenderness: There is no abdominal tenderness.  Musculoskeletal:     Right lower leg: No edema.     Left lower leg: No edema.  Skin:    General: Skin is warm and dry.  Neurological:     Mental Status: She is alert and oriented to person, place, and time.  Psychiatric:        Mood and Affect: Mood normal.        Behavior: Behavior normal.    EKG: 10 mg daily since August.  EKG, sinus rhythm with single ectopic beat, left bundle branch block.  Rate 61.  Baseline artifact, hard to evaluate ST wave/T waves.  Multiple attempts performed for improved reading.  Compared to 07/10/2021, less ectopic beats.  Poct glucose 94.     Assessment & Plan:  Sabrina Knight is a 71 y.o. female . Type 2 diabetes mellitus with microalbuminuria, without long-term current use of insulin (HCC) - Plan: POCT glucose (manual entry)  -Stable in office reading.  Lab only visit in 1 month for A1c.  51-monthfollow-up.  No med changes for now.  -Prior urine culture reassuring, denies candidal vaginitis symptoms at this time but RTC precautions given.  Bradycardia - Plan: EKG 12-Lead Other fatigue - Plan: POCT glucose (manual entry), EKG 12-Lead HFrEF (heart failure with reduced ejection fraction) (HCC)  -Persistent fatigue, bradycardia noted as above.  Potentially could have component due to bradycardia.  We will stop beta-blocker at this time -briefly consulted cardiology regarding this decision, patient will also advise their office regarding plans to restart Entresto.  RTC/ER precautions.  No orders of the defined types were placed in this encounter.  Patient Instructions  Please have lab visit for repeat hemoglobin A1c.anytime after 10/27.  [facility] 8:30-4:30 during weekdays, no appointment needed [address]  [place] [id number] No change in [place] for now. If any continued or worsening urinary symptoms, return for further testing. Blood sugar looks ok today - ok to check up to once per day - fasting or 2 hours after meals. If any low readings let me know.   I will let you know if any med changes recommended form Dr. SMarlou Porch      Signed,   JMerri Ray MD [facility] 10/26/21 10:50 AM

## 2021-10-26 NOTE — Patient Instructions (Addendum)
Please have lab visit for repeat hemoglobin A1c.anytime after 10/27.  [facility] 8:30-4:30 during weekdays, no appointment needed [place].  [place], [place] [address]  No change in [place] for now. If any continued or worsening urinary symptoms, return for further testing. Blood sugar looks ok today - ok to check up to once per day - fasting or 2 hours after meals. If any low readings let me know.   Stop metoprolol for now as recommended form Dr. Marlou Porch.   Return to the clinic or go to the nearest emergency room if any of your symptoms worsen or new symptoms occur.

## 2021-11-12 DIAGNOSIS — M8589 Other specified disorders of bone density and structure, multiple sites: Secondary | ICD-10-CM | POA: Diagnosis not present

## 2021-11-12 DIAGNOSIS — Z1231 Encounter for screening mammogram for malignant neoplasm of breast: Secondary | ICD-10-CM | POA: Diagnosis not present

## 2021-11-12 LAB — HM DEXA SCAN

## 2021-11-12 LAB — HM MAMMOGRAPHY

## 2021-11-13 ENCOUNTER — Telehealth: Payer: Self-pay | Admitting: Lab

## 2021-11-13 ENCOUNTER — Encounter: Payer: Self-pay | Admitting: Family Medicine

## 2021-11-13 NOTE — Telephone Encounter (Signed)
Received paperwork from [facility] and put in Dr. Mancel Bale folder 11/13/2021

## 2021-11-13 NOTE — Telephone Encounter (Signed)
Received [facility] Dexa scan paperwork from the front and put in Dr. Mancel Bale folder 11/13/2021

## 2021-11-14 ENCOUNTER — Telehealth: Payer: Self-pay | Admitting: Lab

## 2021-11-14 NOTE — Telephone Encounter (Signed)
Placed [facility] reading from her mammogram in Dr. Mancel Bale folder on 11/14/2021

## 2021-11-15 ENCOUNTER — Encounter: Payer: Self-pay | Admitting: Family Medicine

## 2021-11-15 NOTE — Progress Notes (Signed)
Put abstract in

## 2021-11-16 NOTE — Telephone Encounter (Signed)
Attempted call to patient no answer, LM

## 2021-11-16 NOTE — Telephone Encounter (Signed)
Bone density report reviewed.  Bone density at all sites measured was stable.  Still has diagnosis of osteopenia or thin bones but not osteoporosis.  Repeat study in 2 years.  Continue calcium and vitamin D supplements.  Make sure to obtain at least 1200 to 1500 mg of calcium per day, and 800 to 1000 units of vitamin D per day.  Let me know if there are questions.

## 2021-11-19 ENCOUNTER — Other Ambulatory Visit: Payer: Self-pay | Admitting: Family Medicine

## 2021-11-19 ENCOUNTER — Telehealth: Payer: Self-pay

## 2021-11-19 ENCOUNTER — Other Ambulatory Visit (INDEPENDENT_AMBULATORY_CARE_PROVIDER_SITE_OTHER): Payer: Medicare HMO

## 2021-11-19 ENCOUNTER — Telehealth: Payer: Self-pay | Admitting: Family Medicine

## 2021-11-19 DIAGNOSIS — E1129 Type 2 diabetes mellitus with other diabetic kidney complication: Secondary | ICD-10-CM

## 2021-11-19 DIAGNOSIS — R809 Proteinuria, unspecified: Secondary | ICD-10-CM | POA: Diagnosis not present

## 2021-11-19 LAB — COMPREHENSIVE METABOLIC PANEL
ALT: 26 U/L (ref 0–35)
AST: 24 U/L (ref 0–37)
Albumin: 4.1 g/dL (ref 3.5–5.2)
Alkaline Phosphatase: 77 U/L (ref 39–117)
BUN: 16 mg/dL (ref 6–23)
CO2: 25 mEq/L (ref 19–32)
Calcium: 9.5 mg/dL (ref 8.4–10.5)
Chloride: 108 mEq/L (ref 96–112)
Creatinine, Ser: 0.87 mg/dL (ref 0.40–1.20)
GFR: 66.57 mL/min (ref >60.00)
Glucose, Bld: 100 mg/dL — ABNORMAL HIGH (ref 70–99)
Potassium: 4.1 mEq/L (ref 3.5–5.1)
Sodium: 142 mEq/L (ref 135–145)
Total Bilirubin: 0.4 mg/dL (ref 0.2–1.2)
Total Protein: 7.2 g/dL (ref 6.0–8.3)

## 2021-11-19 LAB — HEMOGLOBIN A1C: Hgb A1c MFr Bld: 7 % — ABNORMAL HIGH (ref 4.6–6.5)

## 2021-11-19 NOTE — Telephone Encounter (Signed)
Labs ordered for patient to have done at [facility]

## 2021-11-19 NOTE — Telephone Encounter (Signed)
Error

## 2021-11-19 NOTE — Progress Notes (Signed)
A1c order

## 2021-11-27 ENCOUNTER — Ambulatory Visit (HOSPITAL_COMMUNITY): Payer: Medicare HMO | Attending: Cardiology

## 2021-11-27 DIAGNOSIS — I428 Other cardiomyopathies: Secondary | ICD-10-CM | POA: Insufficient documentation

## 2021-11-27 DIAGNOSIS — I5022 Chronic systolic (congestive) heart failure: Secondary | ICD-10-CM | POA: Diagnosis not present

## 2021-11-27 LAB — ECHOCARDIOGRAM COMPLETE
Area-P 1/2: 2.29 cm2
S' Lateral: 3.8 cm

## 2021-11-28 NOTE — Progress Notes (Signed)
Cardiology Office Note:    Date:  12/04/2021   ID:  Sabrina Knight, DOB 12/16/1950, MRN [id number]  PCP:  Wendie Agreste, MD   [facility] Providers Cardiologist:  Candee Furbish, MD     Referring MD: Wendie Agreste, MD   Chief Complaint: follow-up CHF  History of Present Illness:    Sabrina Knight is a very pleasant 71 y.o. female with a hx of chronic HFrEF, former tobacco abuse, and seasonal allergies.   Referred by PCP and seen by Dr. Gillian Shields on 07/18/21 for shortness of breath, chest pain, ventricular bigeminy on ECG with left bundle branch block underlying.  She had noticed more shortness of breath when trying to exercise.  Usually does water aerobics at the [facility].  She has both asthma and allergies and uses Pulmicort, but had noticed something different over the previous few days.  She felt too tired to work out, much more dyspnea.  She also felt chest tightness, more windedness, and heaviness in the shoulders and back.  Also felt shortness of breath when walking a short distance. Felt more fatigue and general. Due to concern regarding her symptoms, former smoking history, and family history, she was scheduled for left heart catheterization.  She underwent LHC 07/19/2021 revealing no angiographic evidence of CAD and global LV systolic dysfunction.  LVEDP was 16 to 20 mmHg.  LVEF 35 to 45% by visual estimate.  She had a 2D echo 08/03/2021 revealing LVEF 30 to 35%.  She was started on Entresto 24-26 mg twice daily.  She completed a heart monitor showing an average heart rate 74 bpm with sinus rhythm first-degree AV block and frequent PVCs (5.3%).  No atrial fibrillation present, no ventricular tachycardia, no pauses.  Reported at follow-up office visit 08/08/2021 with Finis Bud, NP that she was feeling worse since starting Entresto.  Reported more shortness of breath with exertion and feeling winded getting into appointment that day.  Her arms feel heavy and she feels lethargic.   Lightheaded on a few occasions, once with bending over, another time when standing in line at [facility] and once when sitting down at home. Also took Lasix prior to appointment and it did not seem to help much.  Advised to increase Lasix to 40 mg daily for 3 days then resume 20 mg daily and carefully monitor weight and BP. Pro bnp was normal and bmet was stable.   Seen in follow-up by Dr. Marlou Porch on 08/31/21.  Discussion of GDMT ensued and she elected to continue Entresto 24-26, Jardiance, and low-dose Toprol.  She was no longer taking Lasix as she felt it was not effective. A 3 month follow-up was recommended. She called our office to report she was unable to afford Entresto and did not qualify for patient assistance. She was switched to losartan 25 mg daily. Repeat echo 11/27/21 revealed LVEF 40 to 45%, a slight improvement from echo 07/2021,  mild LVH, grade 1 diastolic dysfunction, mild to moderate MR  Today, she is here alone for follow-up. She reports she is working out doing 1 1/[facility] This is about half as active as she was prior to establishing care with cardiology June 2023. She thought breathlessness she was feeling several months prior to appointment was 2/2 allergies. Now, she tolerates her workout without difficulty, however she does take a nap afterwards. She denies chest pain, shortness of breath, lower extremity edema, palpitations, melena, hematuria,  hemoptysis, diaphoresis, weakness, presyncope, syncope, orthopnea, and PND. BP at home is well-controlled with SBP generally < 120 mmHg. No concerns about medications.   Past Medical History:  Diagnosis Date   Allergy    Zyrtec, Singulair; allergy testing negative; Orvil Feil.   Arthritis    Asthma due to seasonal allergies    Cataract    Clotting disorder ([place])    Glaucoma    Gout    ITP (idiopathic thrombocytopenic purpura)     Past Surgical History:  Procedure Laterality Date    ABDOMINAL HYSTERECTOMY  01/22/1999   ovaries resected; DUB/fibroids   CARPAL TUNNEL RELEASE Right 09/11/2018   Procedure: CARPAL TUNNEL RELEASE;  Surgeon: Daryll Brod, MD;  Location: [place];  Service: Orthopedics;  Laterality: Right;   CARPAL TUNNEL RELEASE Left 10/02/2018   Procedure: CARPAL TUNNEL RELEASE;  Surgeon: Daryll Brod, MD;  Location: [place];  Service: Orthopedics;  Laterality: Left;  FAB   EYE SURGERY N/A    [employer] 06/26/2019   LEFT HEART CATH AND CORONARY ANGIOGRAPHY N/A 07/19/2021   Procedure: LEFT HEART CATH AND CORONARY ANGIOGRAPHY;  Surgeon: Burnell Blanks, MD;  Location: [place] [facility];  Service: Cardiovascular;  Laterality: N/A;   spleen removal  01/22/1995   for ITP   spleen removal      Current Medications: Current Meds  Medication Sig   albuterol (VENTOLIN HFA) 108 (90 Base) MCG/ACT inhaler Inhale into the lungs as needed for shortness of breath or wheezing.   allopurinol (ZYLOPRIM) 100 MG tablet Take 1 tablet (100 mg total) by mouth 2 (two) times daily.   aspirin EC 81 MG tablet Take 81 mg by mouth daily. Swallow whole.   blood glucose meter kit and supplies Dispense based on patient and insurance preference. Use once per day.   budesonide (PULMICORT) 0.5 MG/2ML nebulizer solution Take by nebulization as needed (shortness of breath).   cetirizine (ZYRTEC) 10 MG tablet Take 10 mg by mouth daily.   co-enzyme Q-10 30 MG capsule Take 30 mg by mouth daily.   empagliflozin (JARDIANCE) 10 MG TABS tablet Take 1 tablet (10 mg total) by mouth daily before breakfast.   ENTRESTO 24-26 MG Take 1 tablet by mouth 2 (two) times daily.   Glycerin-Polysorbate 80 (REFRESH DRY EYE THERAPY OP) Place 1 drop into both eyes daily as needed (Dry eyes).   Krill Oil 300 MG CAPS Take 300 mg by mouth daily.   magnesium gluconate (MAGONATE) 500 MG tablet Take 500 mg by mouth at bedtime.   montelukast (SINGULAIR) 10 MG tablet Take 1 tablet (10 mg  total) by mouth daily. (Patient taking differently: Take 10 mg by mouth at bedtime.)   Multiple Vitamin (MULTIVITAMIN WITH MINERALS) TABS tablet Take 1 tablet by mouth daily. Woman   SYMBICORT 80-4.5 MCG/ACT inhaler Inhale 2 puffs into the lungs as needed (Shortness of breath).   Tart Cherry 1200 MG CAPS Take 1,200 mg by mouth at bedtime.   VITAMIN D, CHOLECALCIFEROL, PO Take 500 mg by mouth daily.   vitamin E 180 MG (400 UNITS) capsule Take 400 Units by mouth daily.   [DISCONTINUED] rosuvastatin (CRESTOR) 10 MG tablet TAKE 1 TAB BY MOUTH DAILY IN THE EVENING 5 DAYS PER WEEK.     Allergies:   Lisinopril   Social History   Socioeconomic History   Marital status: Widowed    Spouse name: Not on file   Number of children: 0   Years of education: Not on file  Highest education level: Not on file  Occupational History   Occupation: Retired  Tobacco Use   Smoking status: Former    Packs/day: 0.50    Types: Cigarettes    Quit date: 12/04/2003    Years since quitting: 18.0   Smokeless tobacco: Never  Vaping Use   Vaping Use: Never used  Substance and Sexual Activity   Alcohol use: No    Alcohol/week: 0.0 standard drinks of alcohol   Drug use: No   Sexual activity: Not Currently    Birth control/protection: Surgical  Other Topics Concern   Not on file  Social History Narrative   Marital status:  Widowed x since 2001 from [place]; married x 27 years.  Not dating really in 2019.      Children:  None; failed in vitro.      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      Tobacco: quit ; smoked 20 years.      Alcohol: none      Education: [employer].       Exercise: Yes; 3 times per week 2 hours deep water aerobics.      ADLs: independent ADLs; no assistant devices.      Advanced Directives:  None; FULL CODE; no prolonged measures.    HCPOA: Darlene Jackson/sister-in-law.     Social Determinants of Health   Financial Resource Strain: Low  Risk  (04/26/2021)   Overall Financial Resource Strain (CARDIA)    Difficulty of Paying Living Expenses: Not hard at all  Food Insecurity: No Food Insecurity (04/26/2021)   Hunger Vital Sign    Worried About Running Out of Food in the Last Year: Never true    Ran Out of Food in the Last Year: Never true  Transportation Needs: No Transportation Needs (04/26/2021)   PRAPARE - Hydrologist (Medical): No    Lack of Transportation (Non-Medical): No  Physical Activity: Sufficiently Active (04/26/2021)   Exercise Vital Sign    Days of Exercise per Week: 3 days    Minutes of Exercise per Session: 60 min  Stress: No Stress Concern Present (04/26/2021)   [place]    Feeling of Stress : Not at all  Social Connections: Moderately Isolated (04/26/2021)   Social Connection and Isolation Panel [NHANES]    Frequency of Communication with Friends and Family: Twice a week    Frequency of Social Gatherings with Friends and Family: Twice a week    Attends Religious Services: 1 to 4 times per year    Active Member of [employer] or Organizations: No    Attends Archivist Meetings: Never    Marital Status: Widowed     Family History: The patient's family history includes COPD in her father; Cancer (age of onset: 18) in her brother; Cancer (age of onset: 75) in her mother; Diabetes in her brother, brother, brother, and sister; Heart disease (age of onset: 87) in her brother; Hyperlipidemia in her mother; Hypertension in her brother, brother, brother, brother, mother, sister, and sister; Mental illness in her mother.  ROS:   Please see the history of present illness.   All other systems reviewed and are negative.  Labs/Other Studies Reviewed:    The following studies were reviewed today:  Echo 11/27/2021  1. Left ventricular ejection fraction, by estimation, is 40 to 45%. The  left ventricle has moderately  decreased  function. The left ventricle  demonstrates global hypokinesis. There is mild concentric left ventricular  hypertrophy. Left ventricular  diastolic parameters are consistent with Grade I diastolic dysfunction  (impaired relaxation).   2. Right ventricular systolic function is normal. The right ventricular  size is normal.   3. The mitral valve is normal in structure. Mild to moderate mitral valve  regurgitation. No evidence of mitral stenosis.   4. The aortic valve has an indeterminant number of cusps. Aortic valve  regurgitation is not visualized. No aortic stenosis is present.   5. The inferior vena cava is normal in size with greater than 50%  respiratory variability, suggesting right atrial pressure of 3 mmHg.   Comparison(s): Slightly improved.  Cardiac Monitor 08/31/2021    Average heart rate 74 bpm with sinus rhythm first-degree AV block.   Interventricular conduction delay present.   Rare atrial tachycardia   Rare Wenkebach   Rare PACs   Frequent PVCs 5.3%   No atrial fibrillation present, no ventricular tachycardia.  No pauses.   Findings discussed during office visit.   Echo 08/03/2021   1. Left ventricular ejection fraction, by estimation, is 30 to 35%. The  left ventricle has moderately decreased function. The left ventricle  demonstrates global hypokinesis. The left ventricular internal cavity size  was mildly dilated. There is moderate   left ventricular hypertrophy. Left ventricular diastolic parameters are  consistent with Grade II diastolic dysfunction (pseudonormalization).  Elevated left atrial pressure.   2. Right ventricular systolic function is normal. The right ventricular  size is normal. Tricuspid regurgitation signal is inadequate for assessing  PA pressure.   3. The mitral valve is normal in structure. Mild mitral valve  regurgitation.   4. The aortic valve is tricuspid. Aortic valve regurgitation is trivial.  Aortic valve sclerosis is  present, with no evidence of aortic valve  stenosis.   5. The inferior vena cava is normal in size with greater than 50%  respiratory variability, suggesting right atrial pressure of 3 mmHg.   LHC 07/19/2021    There is moderate left ventricular systolic dysfunction.   LV end diastolic pressure is mildly elevated.   The left ventricular ejection fraction is 35-45% by visual estimate.   There is no mitral valve regurgitation.   No angiographic evidence of CAD.  Global LV systolic dysfunction.  LVEDP=16-20 mmHg   Recommendations: She will have an outpatient echo for further assessment of LV function. Her LV systolic function appears depressed on the LV gram. She is also having frequent PVCs which may have contributed to her cardiomyopathy. I will review with Dr. Marlou Porch. She may benefit from daily Lasix.     Recent Labs: 08/08/2021: Hemoglobin 15.4; Platelets 415.0 08/16/2021: Magnesium 1.9; Pro B Natriuretic peptide (BNP) 159.0; TSH 1.17 11/19/2021: ALT 26; BUN 16; Creatinine, Ser 0.87; Potassium 4.1; Sodium 142  Recent Lipid Panel    Component Value Date/Time   CHOL 169 08/16/2021 0812   CHOL 204 (H) 12/29/2019 1147   TRIG 236.0 (H) 08/16/2021 0812   HDL 41.20 08/16/2021 0812   HDL 52 12/29/2019 1147   CHOLHDL 4 08/16/2021 0812   VLDL 47.2 (H) 08/16/2021 0812   LDLCALC 71 02/07/2021 1125   LDLCALC 120 (H) 12/29/2019 1147   LDLDIRECT 91.0 08/16/2021 0812     Risk Assessment/Calculations:      Physical Exam:    VS:  BP 122/68   Pulse 62   Ht 5' 4.5" (1.638 m)   Wt 192 lb (87.1 kg)  SpO2 94%   BMI 32.45 kg/m     Wt Readings from Last 3 Encounters:  12/04/21 192 lb (87.1 kg)  10/26/21 195 lb 3.2 oz (88.5 kg)  10/03/21 193 lb 9.6 oz (87.8 kg)     GEN:  Well nourished, well developed in no acute distress HEENT: Normal NECK: No JVD; No carotid bruits CARDIAC: RRR, soft systolic murmur. No rubs, gallops RESPIRATORY:  Clear to auscultation without rales, wheezing or  rhonchi  ABDOMEN: Soft, non-tender, non-distended MUSCULOSKELETAL:  No edema; No deformity. 2+ pedal pulses, equal bilaterally SKIN: Warm and dry NEUROLOGIC:  Alert and oriented x 3 PSYCHIATRIC:  Normal affect   EKG:  EKG is not ordered today.     Diagnoses:    1. Chronic systolic heart failure ([place])   2. Hyperlipidemia LDL goal <100   3. PVC's (premature ventricular contractions)   4. Non-ischemic cardiomyopathy ([place])   5. Left bundle branch block (LBBB)   6. Nonrheumatic mitral valve regurgitation    Assessment and Plan:     Chronic HFrEF/NICM: Slight improvement on echo 11/27/21 in LVEF to 40-45%, up from 30-35% 07/2021. Feeling well without dyspnea, orthopnea, PND, or chest pain. Appears euvolemic on exam. Mild fatigue with exercise. Tolerating GDMT on Entresto, Jardiance, and metoprolol with no concerns. Creatinine and electrolytes are stable on recent lab work.   PVCs: Frequent PVCs 5.3% on cardiac monitor 08/2021. She is asymptomatic. Continue metoprolol.   Mitral regurgitation: Mild to moderate miltral valve regurgitation on echo 11/27/2021. She was not aware of this diagnosis. Has soft systolic murmur on exam. We discussed that this is stable and we will continue to monitor for now and repeat echo when clinically indicated.   LBBB: No evidence of bradycardia. No lightheadedness, presyncope, syncope. LVEF 40-45% on echo. Will continue to monitor with routine EKG.   Hyperlipidemia: LDL 91 on 08/16/21. Taking rosuvastatin 10 every other night. She asks if LDL could be better and I advised that goal of < 70 is encouraged to reduce risk of CAD. She will try taking rosuvastatin 10 mg daily. Is scheduled for fasting labs with PCP in January.      Disposition: 6 months with Dr. Marlou Porch  Medication Adjustments/Labs and Tests Ordered: Current medicines are reviewed at length with the patient today.  Concerns regarding medicines are outlined above.  No orders of the defined types were  placed in this encounter.  Meds ordered this encounter  Medications   rosuvastatin (CRESTOR) 10 MG tablet    Sig: Take 1 tablet (10 mg total) by mouth daily.    Dispense:  90 tablet    Refill:  3    Patient Instructions  Medication Instructions:   CHANGE Rosuvastatin one (1 ) tablet by mouth ( 10 mg ) daily.   *If you need a refill on your cardiac medications before your next appointment, please call your pharmacy*   Lab Work:  None ordered.  If you have labs (blood work) drawn today and your tests are completely normal, you will receive your results only by: [place] (if you have MyChart) OR A paper copy in the mail If you have any lab test that is abnormal or we need to change your treatment, we will call you to review the results.   Testing/Procedures:  None ordered.   Follow-Up: At [facility], you and your health needs are our priority.  As part of our continuing mission to provide you with exceptional heart care, we have created designated Provider  Care Teams.  These Care Teams include your primary Cardiologist (physician) and Advanced Practice Providers (APPs -  Physician Assistants and Nurse Practitioners) who all work together to provide you with the care you need, when you need it.  We recommend signing up for the patient portal called "MyChart".  Sign up information is provided on this After Visit Summary.  MyChart is used to connect with patients for Virtual Visits (Telemedicine).  Patients are able to view lab/test results, encounter notes, upcoming appointments, etc.  Non-urgent messages can be sent to your provider as well.   To learn more about what you can do with MyChart, go to [email or web address].    Your next appointment:   6 month(s)  The format for your next appointment:   In Person  Provider:   Candee Furbish, MD     Other Instructions  Please send office mychart message on Metoprolol.   Important Information About  Sugar         Signed, Emmaline Life, NP  12/04/2021 11:03 AM    [place]

## 2021-12-04 ENCOUNTER — Encounter: Payer: Self-pay | Admitting: Nurse Practitioner

## 2021-12-04 ENCOUNTER — Ambulatory Visit: Payer: Medicare HMO | Attending: Nurse Practitioner | Admitting: Nurse Practitioner

## 2021-12-04 ENCOUNTER — Encounter: Payer: Self-pay | Admitting: Cardiology

## 2021-12-04 VITALS — BP 122/68 | HR 62 | Ht 64.5 in | Wt 192.0 lb

## 2021-12-04 DIAGNOSIS — I5022 Chronic systolic (congestive) heart failure: Secondary | ICD-10-CM | POA: Diagnosis not present

## 2021-12-04 DIAGNOSIS — J4530 Mild persistent asthma, uncomplicated: Secondary | ICD-10-CM | POA: Diagnosis not present

## 2021-12-04 DIAGNOSIS — I493 Ventricular premature depolarization: Secondary | ICD-10-CM

## 2021-12-04 DIAGNOSIS — I447 Left bundle-branch block, unspecified: Secondary | ICD-10-CM | POA: Diagnosis not present

## 2021-12-04 DIAGNOSIS — J300 Vasomotor rhinitis: Secondary | ICD-10-CM | POA: Diagnosis not present

## 2021-12-04 DIAGNOSIS — E785 Hyperlipidemia, unspecified: Secondary | ICD-10-CM | POA: Diagnosis not present

## 2021-12-04 DIAGNOSIS — I428 Other cardiomyopathies: Secondary | ICD-10-CM | POA: Diagnosis not present

## 2021-12-04 DIAGNOSIS — J3089 Other allergic rhinitis: Secondary | ICD-10-CM | POA: Diagnosis not present

## 2021-12-04 DIAGNOSIS — I340 Nonrheumatic mitral (valve) insufficiency: Secondary | ICD-10-CM | POA: Diagnosis not present

## 2021-12-04 MED ORDER — ROSUVASTATIN CALCIUM 10 MG PO TABS: 10.0000 mg | ORAL_TABLET | Freq: Every day | ORAL | 3 refills | Status: DC

## 2021-12-04 NOTE — Patient Instructions (Signed)
Medication Instructions:   CHANGE Rosuvastatin one (1 ) tablet by mouth ( 10 mg ) daily.   *If you need a refill on your cardiac medications before your next appointment, please call your pharmacy*   Lab Work:  None ordered.  If you have labs (blood work) drawn today and your tests are completely normal, you will receive your results only by: [place] (if you have MyChart) OR A paper copy in the mail If you have any lab test that is abnormal or we need to change your treatment, we will call you to review the results.   Testing/Procedures:  None ordered.   Follow-Up: At [facility], you and your health needs are our priority.  As part of our continuing mission to provide you with exceptional heart care, we have created designated Provider Care Teams.  These Care Teams include your primary Cardiologist (physician) and Advanced Practice Providers (APPs -  Physician Assistants and Nurse Practitioners) who all work together to provide you with the care you need, when you need it.  We recommend signing up for the patient portal called "MyChart".  Sign up information is provided on this After Visit Summary.  MyChart is used to connect with patients for Virtual Visits (Telemedicine).  Patients are able to view lab/test results, encounter notes, upcoming appointments, etc.  Non-urgent messages can be sent to your provider as well.   To learn more about what you can do with MyChart, go to [email or web address].    Your next appointment:   6 month(s)  The format for your next appointment:   In Person  Provider:   Candee Furbish, MD     Other Instructions  Please send office mychart message on Metoprolol.   Important Information About Sugar

## 2022-01-31 ENCOUNTER — Other Ambulatory Visit: Payer: Self-pay | Admitting: Family Medicine

## 2022-01-31 DIAGNOSIS — M109 Gout, unspecified: Secondary | ICD-10-CM

## 2022-02-11 ENCOUNTER — Ambulatory Visit: Payer: Medicare HMO | Admitting: Family Medicine

## 2022-02-28 ENCOUNTER — Encounter: Payer: Self-pay | Admitting: Family Medicine

## 2022-02-28 ENCOUNTER — Ambulatory Visit (INDEPENDENT_AMBULATORY_CARE_PROVIDER_SITE_OTHER): Payer: Medicare HMO | Admitting: Family Medicine

## 2022-02-28 VITALS — BP 122/72 | HR 86 | Temp 97.8°F | Ht 64.5 in | Wt 197.0 lb

## 2022-02-28 DIAGNOSIS — R809 Proteinuria, unspecified: Secondary | ICD-10-CM

## 2022-02-28 DIAGNOSIS — I5020 Unspecified systolic (congestive) heart failure: Secondary | ICD-10-CM | POA: Diagnosis not present

## 2022-02-28 DIAGNOSIS — E1129 Type 2 diabetes mellitus with other diabetic kidney complication: Secondary | ICD-10-CM | POA: Diagnosis not present

## 2022-02-28 LAB — COMPREHENSIVE METABOLIC PANEL
ALT: 27 U/L (ref 0–35)
AST: 24 U/L (ref 0–37)
Albumin: 4.2 g/dL (ref 3.5–5.2)
Alkaline Phosphatase: 82 U/L (ref 39–117)
BUN: 14 mg/dL (ref 6–23)
CO2: 28 mEq/L (ref 19–32)
Calcium: 9.9 mg/dL (ref 8.4–10.5)
Chloride: 106 mEq/L (ref 96–112)
Creatinine, Ser: 0.89 mg/dL (ref 0.40–1.20)
GFR: 64.65 mL/min (ref >60.00)
Glucose, Bld: 119 mg/dL — ABNORMAL HIGH (ref 70–99)
Potassium: 4.5 mEq/L (ref 3.5–5.1)
Sodium: 142 mEq/L (ref 135–145)
Total Bilirubin: 0.4 mg/dL (ref 0.2–1.2)
Total Protein: 7.3 g/dL (ref 6.0–8.3)

## 2022-02-28 LAB — GLUCOSE, POCT (MANUAL RESULT ENTRY): POC Glucose: 121 mg/dl — AB (ref 70–99)

## 2022-02-28 LAB — HEMOGLOBIN A1C: Hgb A1c MFr Bld: 7.3 % — ABNORMAL HIGH (ref 4.6–6.5)

## 2022-02-28 MED ORDER — LANCETS MISC. MISC: 1.0000 | Freq: Every day | 0 refills | Status: AC

## 2022-02-28 MED ORDER — BLOOD GLUCOSE MONITORING SUPPL DEVI: 1.0000 | Freq: Every day | 0 refills | Status: DC

## 2022-02-28 MED ORDER — BLOOD GLUCOSE TEST VI STRP: 1.0000 | ORAL_STRIP | Freq: Every day | 0 refills | Status: DC

## 2022-02-28 NOTE — Patient Instructions (Addendum)
Check blood sugar once per day - fasting or 2 hours after meals. See info below on diabetes and diet recommendations below as well. If any concerns on labs I will let you know.  Updated covid booster at your pharmacy.  Take care!  Diabetes Mellitus and Nutrition, Adult When you have diabetes, or diabetes mellitus, it is very important to have healthy eating habits because your blood sugar (glucose) levels are greatly affected by what you eat and drink. Eating healthy foods in the right amounts, at about the same times every day, can help you: Manage your blood glucose. Lower your risk of heart disease. Improve your blood pressure. Reach or maintain a healthy weight. What can affect my meal plan? Every person with diabetes is different, and each person has different needs for a meal plan. Your health care provider may recommend that you work with a dietitian to make a meal plan that is best for you. Your meal plan may vary depending on factors such as: The calories you need. The medicines you take. Your weight. Your blood glucose, blood pressure, and cholesterol levels. Your activity level. Other health conditions you have, such as heart or kidney disease. How do carbohydrates affect me? Carbohydrates, also called carbs, affect your blood glucose level more than any other type of food. Eating carbs raises the amount of glucose in your blood. It is important to know how many carbs you can safely have in each meal. This is different for every person. Your dietitian can help you calculate how many carbs you should have at each meal and for each snack. How does alcohol affect me? Alcohol can cause a decrease in blood glucose (hypoglycemia), especially if you use insulin or take certain diabetes medicines by mouth. Hypoglycemia can be a life-threatening condition. Symptoms of hypoglycemia, such as sleepiness, dizziness, and confusion, are similar to symptoms of having too much alcohol. Do not drink  alcohol if: Your health care provider tells you not to drink. You are pregnant, may be pregnant, or are planning to become pregnant. If you drink alcohol: Limit how much you have to: 0-1 drink a day for women. 0-2 drinks a day for men. Know how much alcohol is in your drink. In the [place] one drink equals one 12 oz bottle of beer (355 mL), one 5 oz glass of wine (148 mL), or one 1 oz glass of hard liquor (44 mL). Keep yourself hydrated with water, diet soda, or unsweetened iced tea. Keep in mind that regular soda, juice, and other mixers may contain a lot of sugar and must be counted as carbs. What are tips for following this plan?  Reading food labels Start by checking the serving size on the Nutrition Facts label of packaged foods and drinks. The number of calories and the amount of carbs, fats, and other nutrients listed on the label are based on one serving of the item. Many items contain more than one serving per package. Check the total grams (g) of carbs in one serving. Check the number of grams of saturated fats and trans fats in one serving. Choose foods that have a low amount or none of these fats. Check the number of milligrams (mg) of salt (sodium) in one serving. Most people should limit total sodium intake to less than 2,300 mg per day. Always check the nutrition information of foods labeled as "low-fat" or "nonfat." These foods may be higher in added sugar or refined carbs and should be avoided. Talk to your  dietitian to identify your daily goals for nutrients listed on the label. Shopping Avoid buying canned, pre-made, or processed foods. These foods tend to be high in fat, sodium, and added sugar. Shop around the outside edge of the grocery store. This is where you will most often find fresh fruits and vegetables, bulk grains, fresh meats, and fresh dairy products. Cooking Use low-heat cooking methods, such as baking, instead of high-heat cooking methods, such as deep  frying. Cook using healthy oils, such as olive, canola, or sunflower oil. Avoid cooking with butter, cream, or high-fat meats. Meal planning Eat meals and snacks regularly, preferably at the same times every day. Avoid going long periods of time without eating. Eat foods that are high in fiber, such as fresh fruits, vegetables, beans, and whole grains. Eat 4-6 oz (112-168 g) of lean protein each day, such as lean meat, chicken, fish, eggs, or tofu. One ounce (oz) (28 g) of lean protein is equal to: 1 oz (28 g) of meat, chicken, or fish. 1 egg.  cup (62 g) of tofu. Eat some foods each day that contain healthy fats, such as avocado, nuts, seeds, and fish. What foods should I eat? Fruits Berries. Apples. Oranges. Peaches. Apricots. Plums. Grapes. Mangoes. Papayas. Pomegranates. Kiwi. Cherries. Vegetables Leafy greens, including lettuce, spinach, kale, chard, collard greens, mustard greens, and cabbage. Beets. Cauliflower. Broccoli. Carrots. Green beans. Tomatoes. Peppers. Onions. Cucumbers. Brussels sprouts. Grains Whole grains, such as whole-wheat or whole-grain bread, crackers, tortillas, cereal, and pasta. Unsweetened oatmeal. Quinoa. Brown or wild rice. Meats and other proteins Seafood. Poultry without skin. Lean cuts of poultry and beef. Tofu. Nuts. Seeds. Dairy Low-fat or fat-free dairy products such as milk, yogurt, and cheese. The items listed above may not be a complete list of foods and beverages you can eat and drink. Contact a dietitian for more information. What foods should I avoid? Fruits Fruits canned with syrup. Vegetables Canned vegetables. Frozen vegetables with butter or cream sauce. Grains Refined white flour and flour products such as bread, pasta, snack foods, and cereals. Avoid all processed foods. Meats and other proteins Fatty cuts of meat. Poultry with skin. Breaded or fried meats. Processed meat. Avoid saturated fats. Dairy Full-fat yogurt, cheese, or  milk. Beverages Sweetened drinks, such as soda or iced tea. The items listed above may not be a complete list of foods and beverages you should avoid. Contact a dietitian for more information. Questions to ask a health care provider Do I need to meet with a certified diabetes care and education specialist? Do I need to meet with a dietitian? What number can I call if I have questions? When are the best times to check my blood glucose? Where to find more information: American Diabetes Association: diabetes.org Academy of Nutrition and Dietetics: eatright.[employer] of Diabetes and Digestive and Kidney Diseases: [employer] Association of Diabetes Care & Education Specialists: diabeteseducator.org Summary It is important to have healthy eating habits because your blood sugar (glucose) levels are greatly affected by what you eat and drink. It is important to use alcohol carefully. A healthy meal plan will help you manage your blood glucose and lower your risk of heart disease. Your health care provider may recommend that you work with a dietitian to make a meal plan that is best for you. This information is not intended to replace advice given to you by your health care provider. Make sure you discuss any questions you have with your health care provider. Document Revised: 08/11/2019 Document  Reviewed: 08/11/2019 Elsevier Patient Education  [place].   Type 2 Diabetes Mellitus, Self-Care, Adult Caring for yourself after you have been diagnosed with type 2 diabetes (type 2 diabetes mellitus) means keeping your blood sugar (glucose) under control with a balance of: Nutrition. Exercise. Lifestyle changes. Medicines or insulin, if needed. Support from your team of health care providers and others. What are the risks? Having type 2 diabetes can put you at risk for other long-term (chronic) conditions, such as heart disease and kidney disease. Your health care provider  may prescribe medicines to help prevent complications from diabetes. How to monitor your blood glucose  Check your blood glucose every day or as often as told by your health care provider. Have your A1C (hemoglobin A1C) level checked two or more times a year, or as often as told by your health care provider. Your health care provider will set personalized treatment goals for you. Generally, the goal of treatment is to maintain the following blood glucose levels: Before meals: 80-130 mg/dL (4.4-7.2 mmol/L). After meals: below 180 mg/dL (10 mmol/L). A1C level: less than 7%. How to manage hyperglycemia and hypoglycemia Hyperglycemia symptoms Hyperglycemia, also called high blood glucose, occurs when blood glucose is too high. Make sure you know the early signs of hyperglycemia, such as: Increased thirst. Hunger. Feeling very tired. Needing to urinate more often than usual. Blurry vision. Hypoglycemia symptoms Hypoglycemia, also called low blood glucose, occurs with a blood glucose level at or below 70 mg/dL (3.9 mmol/L). Diabetes medicines lower your blood glucose and can cause hypoglycemia. The risk for hypoglycemia increases during or after exercise, during sleep, during illness, and when skipping meals or not eating for a long time (fasting). It is important to know the symptoms of hypoglycemia and treat it right away. Always have a 15-gram rapid-acting carbohydrate snack with you to treat low blood glucose. Family members and close friends should also know the symptoms and understand how to treat hypoglycemia, in case you are not able to treat yourself. Symptoms may include: Hunger. Anxiety. Sweating and feeling clammy. Dizziness or feeling light-headed. Sleepiness. Increased heart rate. Irritability. Tingling or numbness around the mouth, lips, or tongue. Restless sleep. Severe hypoglycemia is when your blood glucose level is at or below 54 mg/dL (3 mmol/L). Severe hypoglycemia is an  emergency. Do not wait to see if the symptoms will go away. Get medical help right away. Call your local emergency services (911 in the [place] Do not drive yourself to the hospital. If you have severe hypoglycemia and you cannot eat or drink, you may need glucagon. A family member or close friend should learn how to check your blood glucose and how to give you glucagon. Ask your health care provider if you need to have an emergency glucagon kit available. Follow these instructions at home: Medicines Take prescribed insulin or diabetes medicines as told by your health care provider. Do not run out of insulin or other diabetes medicines. Plan ahead so you always have these available. If you use insulin, adjust your dosage based on your physical activity and what foods you eat. Your health care provider will tell you how to adjust your dosage. Take over-the-counter and prescription medicines only as told by your health care provider. Eating and drinking  What you eat and drink affects your blood glucose and your insulin dosage. Making good choices helps to control your diabetes and prevent other health problems. A healthy meal plan includes eating lean proteins, complex carbohydrates, fresh  fruits and vegetables, low-fat dairy products, and healthy fats. Make an appointment to see a registered dietitian to help you create an eating plan that is right for you. Make sure that you: Follow instructions from your health care provider about eating or drinking restrictions. Drink enough fluid to keep your urine pale yellow. Keep a record of the carbohydrates that you eat. Do this by reading food labels and learning the standard serving sizes of foods. Follow your sick-day plan whenever you cannot eat or drink as usual. Make this plan in advance with your health care provider.  Activity Stay active. Exercise regularly, as told by your health care provider. This may include: Stretching and doing strength  exercises, such as yoga or weight lifting, two or more times a week. Doing 150 minutes or more of moderate-intensity or vigorous-intensity exercise each week. This could be brisk walking, biking, or water aerobics. Spread out your activity over 3 or more days of the week. Do not go more than 2 days in a row without doing some kind of physical activity. When you start a new exercise or activity, work with your health care provider to adjust your insulin, medicines, or food intake as needed. Lifestyle Do not use any products that contain nicotine or tobacco. These products include cigarettes, chewing tobacco, and vaping devices, such as e-cigarettes. If you need help quitting, ask your health care provider. If you drink alcohol and your health care provider says that it is safe for you: Limit how much you have to: 0-1 drink a day for women who are not pregnant. 0-2 drinks a day for men. Know how much alcohol is in your drink. In the [place] one drink equals one 12 oz bottle of beer (355 mL), one 5 oz glass of wine (148 mL), or one 1 oz glass of hard liquor (44 mL). Learn to manage stress. If you need help with this, ask your health care provider. Take care of your body  Keep your immunizations up to date. In addition to getting vaccinations as told by your health care provider, it is recommended that you get vaccinated against the following illnesses: The flu (influenza). Get a flu shot every year. Pneumonia. Hepatitis B. Schedule an eye exam soon after your diagnosis, and then one time every year after that. Check your skin and feet every day for cuts, bruises, redness, blisters, or sores. Schedule a foot exam with your health care provider once every year. Brush your teeth and gums two times a day, and floss one or more times a day. Visit your dentist one or more times every 6 months. Maintain a healthy weight. General instructions Share your diabetes management plan with people in your  workplace, school, and household. Carry a medical alert card or wear medical alert jewelry. Keep all follow-up visits. This is important. Questions to ask your health care provider Should I meet with a certified diabetes care and education specialist? Where can I find a support group for people with diabetes? Where to find more information For help and guidance and for more information about diabetes, please visit: American Diabetes Association (ADA): www.diabetes.org American Association of Diabetes Care and Education Specialists (ADCES): www.diabeteseducator.org International Diabetes Federation (IDF): MemberVerification.ca Summary Caring for yourself after you have been diagnosed with type 2 diabetes (type 2 diabetes mellitus) means keeping your blood sugar (glucose) under control with a balance of nutrition, exercise, lifestyle changes, and medicine. Check your blood glucose every day, as often as told by your health  care provider. Having diabetes can put you at risk for other long-term (chronic) conditions, such as heart disease and kidney disease. Your health care provider may prescribe medicines to help prevent complications from diabetes. Share your diabetes management plan with people in your workplace, school, and household. Keep all follow-up visits. This is important. This information is not intended to replace advice given to you by your health care provider. Make sure you discuss any questions you have with your health care provider. Document Revised: 06/07/2020 Document Reviewed: 06/07/2020 Elsevier Patient Education  [place].

## 2022-02-28 NOTE — Progress Notes (Signed)
Subjective:  Patient ID: Sabrina Knight, female    DOB: 07/11/1950  Age: 72 y.o. MRN: [id number]  CC:  Chief Complaint  Patient presents with   Diabetes    Notes no issues with meds, pt would like to discuss her diet     HPI Sabrina Knight presents for   Diabetes: With microalbuminuria, Chronic HFrEF/NICM.  prior diet control with increased A1c last July to 7.6.  Started on Jardiance 10 mg daily in August of last year.  She is on statin, ARB.  Delene Loll was cost prohibitive.  Plan for cardiology follow-up regarding this medication as well as prior bradycardia and stop the beta-blocker at her October visit.  Saw cardiology November 14, back on metoprolol at that time.  34-monthfollow-up.  Treated in September of last year for possible UTI.  No vaginal discharge or itching, has continued on Jardiance.  Improved A1c in October near goal.  Weight slightly increased since last visit. No recent urinary symptoms or vaginal d/c or itching. Home readings: no meter. Microalbumin: Normal 08/16/2021 Optho, foot exam, pneumovax: Up-to-date Exercise 3 times per week, 1 hr Diet - trying to cut back on red meat, more salads/veggies. Limiting sweet tea. More water.  Wt Readings from Last 3 Encounters:  02/28/22 197 lb (89.4 kg)  12/04/21 192 lb (87.1 kg)  10/26/21 195 lb 3.2 oz (88.5 kg)    Lab Results  Component Value Date   HGBA1C 7.0 (H) 11/19/2021   HGBA1C 7.6 (H) 08/16/2021   HGBA1C 7.2 (H) 02/07/2021   Lab Results  Component Value Date   MICROALBUR 1.8 08/16/2021   LDLCALC 71 02/07/2021   CREATININE 0.87 11/19/2021   HM:  Recommended updated covid booster at pharmacy.  Shingrix - has completed at her pharmacy.    History Patient Active Problem List   Diagnosis Date Noted   Shortness of breath    Precordial chest pain 07/18/2021   PVC's (premature ventricular contractions) 07/18/2021   Degenerative disc disease, lumbar 07/12/2020   Degenerative arthritis of left knee  05/26/2020   Greater trochanteric bursitis of both hips 05/26/2020   Mild persistent asthma, uncomplicated 177/82/4235  Vasomotor rhinitis 01/08/2019   Other allergic rhinitis 01/08/2019   Bilateral carpal tunnel syndrome 09/25/2018   Dyslipidemia 10/22/2017   Statin intolerance 10/22/2017   Newly diagnosed diabetes ([place] 10/22/2017   Gout 12/23/2016   Class 1 obesity due to excess calories with serious comorbidity and body mass index (BMI) of 31.0 to 31.9 in adult 05/19/2016   Osteopenia of necks of both femurs 05/19/2016   Thrombocytosis 02/08/2014   High cholesterol 02/01/2013   H/O splenectomy 02/01/2013   Non-allergic rhinitis 02/01/2013   Glaucoma 02/01/2013   Past Medical History:  Diagnosis Date   Allergy    Zyrtec, Singulair; allergy testing negative; [facility]   Arthritis    Asthma due to seasonal allergies    Cataract    Clotting disorder ([place]    Glaucoma    Gout    ITP (idiopathic thrombocytopenic purpura)    Past Surgical History:  Procedure Laterality Date   ABDOMINAL HYSTERECTOMY  01/22/1999   ovaries resected; DUB/fibroids   CARPAL TUNNEL RELEASE Right 09/11/2018   Procedure: CARPAL TUNNEL RELEASE;  Surgeon: KDaryll Brod MD;  [place] [facility] [place]  Service: Orthopedics;  Laterality: Right;   CARPAL TUNNEL RELEASE Left 10/02/2018   Procedure: CARPAL TUNNEL RELEASE;  Surgeon: KDaryll Brod MD;  [place] [facility]  Service: Orthopedics;  Laterality:  Left;  FAB   EYE SURGERY N/A    [employer] 06/26/2019   LEFT HEART CATH AND CORONARY ANGIOGRAPHY N/A 07/19/2021   Procedure: LEFT HEART CATH AND CORONARY ANGIOGRAPHY;  Surgeon: Burnell Blanks, MD;  Location: [facility];  Service: Cardiovascular;  Laterality: N/A;   spleen removal  01/22/1995   for ITP   spleen removal     Allergies  Allergen Reactions   Lisinopril Cough   Prior to Admission medications   Medication Sig Start Date End Date Taking? Authorizing Provider   albuterol (VENTOLIN HFA) 108 (90 Base) MCG/ACT inhaler Inhale into the lungs as needed for shortness of breath or wheezing.   Yes [provider]  allopurinol (ZYLOPRIM) 100 MG tablet TAKE 1 TABLET BY MOUTH TWICE A DAY 01/31/22  Yes Wendie Agreste, MD  aspirin EC 81 MG tablet Take 81 mg by mouth daily. Swallow whole.   Yes [provider]  budesonide (PULMICORT) 0.5 MG/2ML nebulizer solution Take by nebulization as needed (shortness of breath). 04/09/21  Yes [provider]  cetirizine (ZYRTEC) 10 MG tablet Take 10 mg by mouth daily.   Yes [provider]  co-enzyme Q-10 30 MG capsule Take 30 mg by mouth daily.   Yes [provider]  empagliflozin (JARDIANCE) 10 MG TABS tablet Take 1 tablet (10 mg total) by mouth daily before breakfast. 08/27/21  Yes Skains, Thana Farr, MD  ENTRESTO 24-26 MG Take 1 tablet by mouth 2 (two) times daily. 10/29/21  Yes [provider]  Glycerin-Polysorbate 80 (REFRESH DRY EYE THERAPY OP) Place 1 drop into both eyes daily as needed (Dry eyes).   Yes [provider]  Javier Docker Oil 300 MG CAPS Take 300 mg by mouth daily.   Yes [provider]  magnesium gluconate (MAGONATE) 500 MG tablet Take 500 mg by mouth at bedtime.   Yes [provider]  metoprolol succinate (TOPROL-XL) 25 MG 24 hr tablet Take 25 mg by mouth daily. 01/11/22  Yes [provider]  montelukast (SINGULAIR) 10 MG tablet Take 1 tablet (10 mg total) by mouth daily. Patient taking differently: Take 10 mg by mouth at bedtime. 02/07/21  Yes Wendie Agreste, MD  Multiple Vitamin (MULTIVITAMIN WITH MINERALS) TABS tablet Take 1 tablet by mouth daily. Woman   Yes [provider]  rosuvastatin (CRESTOR) 10 MG tablet Take 1 tablet (10 mg total) by mouth daily. 12/04/21  Yes Swinyer, Lanice Schwab, NP  SYMBICORT 80-4.5 MCG/ACT inhaler Inhale 2 puffs into the lungs as needed (Shortness of breath). 06/30/21  Yes [provider]  Tart Cherry 1200 MG CAPS Take 1,200 mg by mouth at bedtime.   Yes [provider]  VITAMIN D, CHOLECALCIFEROL, PO Take 500 mg by mouth daily.   Yes [provider]  vitamin E 180 MG (400 UNITS) capsule Take 400 Units by mouth daily.   Yes [provider]  blood glucose meter kit and supplies Dispense based on patient and insurance preference. Use once per day. Patient not taking: Reported on 02/28/2022 10/26/21   Wendie Agreste, MD   Social History   Socioeconomic History   Marital status: Widowed    Spouse name: Not on file   Number of children: 0   Years of education: Not on file   Highest education level: Not on file  Occupational History   Occupation: Retired  Tobacco Use   Smoking status: Former    Packs/day: 0.50    Types: Cigarettes  Quit date: 12/04/2003    Years since quitting: 18.2   Smokeless tobacco: Never  Vaping Use   Vaping Use: Never used  Substance and Sexual Activity   Alcohol use: No    Alcohol/week: 0.0 standard drinks of alcohol   Drug use: No   Sexual activity: Not Currently    Birth control/protection: Surgical  Other Topics Concern   Not on file  Social History Narrative   Marital status:  Widowed x since 2001 from [place]; married x 27 years.  Not dating really in 2019.      Children:  None; failed in vitro.      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      Tobacco: quit ; smoked 20 years.      Alcohol: none      Education: [employer].       Exercise: Yes; 3 times per week 2 hours deep water aerobics.      ADLs: independent ADLs; no assistant devices.      Advanced Directives:  None; FULL CODE; no prolonged measures.    HCPOA: Darlene Jackson/sister-in-law.     Social Determinants of Health   Financial Resource Strain: Low Risk  (04/26/2021)   Overall Financial Resource Strain (CARDIA)    Difficulty of Paying Living Expenses: Not hard at all  Food Insecurity: No Food  Insecurity (04/26/2021)   Hunger Vital Sign    Worried About Running Out of Food in the Last Year: Never true    Ran Out of Food in the Last Year: Never true  Transportation Needs: No Transportation Needs (04/26/2021)   PRAPARE - Hydrologist (Medical): No    Lack of Transportation (Non-Medical): No  Physical Activity: Sufficiently Active (04/26/2021)   Exercise Vital Sign    Days of Exercise per Week: 3 days    Minutes of Exercise per Session: 60 min  Stress: No Stress Concern Present (04/26/2021)   [place]    Feeling of Stress : Not at all  Social Connections: Moderately Isolated (04/26/2021)   Social Connection and Isolation Panel [NHANES]    Frequency of Communication with Friends and Family: Twice a week    Frequency of Social Gatherings with Friends and Family: Twice a week    Attends Religious Services: 1 to 4 times per year    Active Member of [employer] or Organizations: No    Attends Archivist Meetings: Never    Marital Status: Widowed  Intimate Partner Violence: Not At Risk (04/26/2021)   Humiliation, Afraid, Rape, and Kick questionnaire    Fear of Current or Ex-Partner: No    Emotionally Abused: No    Physically Abused: No    Sexually Abused: No    Review of Systems  Constitutional:  Negative for fatigue and unexpected weight change.  Respiratory:  Negative for chest tightness and shortness of breath.   Cardiovascular:  Negative for chest pain, palpitations and leg swelling.  Gastrointestinal:  Negative for abdominal pain and blood in stool.  Genitourinary:  Negative for difficulty urinating, dysuria and vaginal discharge.  Neurological:  Negative for dizziness, syncope, light-headedness and headaches.    Objective:   Vitals:   02/28/22 0847  BP: 122/72  Pulse: 86  Temp: 97.8 F (36.6 C)  TempSrc: Temporal  SpO2: 98%  Weight: 197 lb (89.4 kg)  Height: 5' 4.5"  (1.638 m)  Physical Exam Vitals reviewed.  Constitutional:      Appearance: Normal appearance. She is well-developed.  HENT:     Head: Normocephalic and atraumatic.  Eyes:     Conjunctiva/sclera: Conjunctivae normal.     Pupils: Pupils are equal, round, and reactive to light.  Neck:     Vascular: No carotid bruit.  Cardiovascular:     Rate and Rhythm: Normal rate and regular rhythm.     Heart sounds: Normal heart sounds.  Pulmonary:     Effort: Pulmonary effort is normal.     Breath sounds: Normal breath sounds.  Abdominal:     Palpations: Abdomen is soft. There is no pulsatile mass.     Tenderness: There is no abdominal tenderness.  Musculoskeletal:     Right lower leg: No edema.     Left lower leg: No edema.  Skin:    General: Skin is warm and dry.  Neurological:     Mental Status: She is alert and oriented to person, place, and time.  Psychiatric:        Mood and Affect: Mood normal.        Behavior: Behavior normal.        Assessment & Plan:  Sabrina Knight is a 72 y.o. female . Type 2 diabetes mellitus with microalbuminuria, without long-term current use of insulin (HCC) - Plan: POCT glucose (manual entry), Hemoglobin A1c, Comprehensive metabolic panel, Blood Glucose Monitoring Suppl DEVI, Glucose Blood (BLOOD GLUCOSE TEST STRIPS) STRP, Lancets Misc. MISC  -Tolerating current regimen, commended on dietary changes.  Handout given on diabetes management and nutrition.  Meter provided.  Intermittent glucose checks, fasting, postprandial and handout given on goals.  31-monthfollow-up.  Continue Jardiance and dose for now.  HFrEF (heart failure with reduced ejection fraction) ([place]  -Back on beta-blocker with stable heart rate, blood pressure in office.  Appears euvolemic.  Continue follow-up with cardiology as planned with RTC precautions  Meds ordered this encounter  Medications   Blood Glucose Monitoring Suppl DEVI    Sig: 1 each by Does not apply route  daily. May substitute to any manufacturer covered by patient's insurance. Check either fasting or 2 hours after meals.    Dispense:  1 each    Refill:  0   Glucose Blood (BLOOD GLUCOSE TEST STRIPS) STRP    Sig: 1 each by In Vitro route daily. May substitute to any manufacturer covered by patient's insurance.    Dispense:  100 strip    Refill:  0   Lancets Misc. MISC    Sig: 1 each by Does not apply route daily. May substitute to any manufacturer covered by patient's insurance.    Dispense:  100 each    Refill:  0   Patient Instructions  Check blood sugar once per day - fasting or 2 hours after meals. See info below on diabetes and diet recommendations below as well. If any concerns on labs I will let you know.  Updated covid booster at your pharmacy.  Take care!  Diabetes Mellitus and Nutrition, Adult When you have diabetes, or diabetes mellitus, it is very important to have healthy eating habits because your blood sugar (glucose) levels are greatly affected by what you eat and drink. Eating healthy foods in the right amounts, at about the same times every day, can help you: Manage your blood glucose. Lower your risk of heart disease. Improve your blood pressure. Reach or maintain a healthy weight. What can affect my meal plan?  Every person with diabetes is different, and each person has different needs for a meal plan. Your health care provider may recommend that you work with a dietitian to make a meal plan that is best for you. Your meal plan may vary depending on factors such as: The calories you need. The medicines you take. Your weight. Your blood glucose, blood pressure, and cholesterol levels. Your activity level. Other health conditions you have, such as heart or kidney disease. How do carbohydrates affect me? Carbohydrates, also called carbs, affect your blood glucose level more than any other type of food. Eating carbs raises the amount of glucose in your blood. It is  important to know how many carbs you can safely have in each meal. This is different for every person. Your dietitian can help you calculate how many carbs you should have at each meal and for each snack. How does alcohol affect me? Alcohol can cause a decrease in blood glucose (hypoglycemia), especially if you use insulin or take certain diabetes medicines by mouth. Hypoglycemia can be a life-threatening condition. Symptoms of hypoglycemia, such as sleepiness, dizziness, and confusion, are similar to symptoms of having too much alcohol. Do not drink alcohol if: Your health care provider tells you not to drink. You are pregnant, may be pregnant, or are planning to become pregnant. If you drink alcohol: Limit how much you have to: 0-1 drink a day for women. 0-2 drinks a day for men. Know how much alcohol is in your drink. In the [place] one drink equals one 12 oz bottle of beer (355 mL), one 5 oz glass of wine (148 mL), or one 1 oz glass of hard liquor (44 mL). Keep yourself hydrated with water, diet soda, or unsweetened iced tea. Keep in mind that regular soda, juice, and other mixers may contain a lot of sugar and must be counted as carbs. What are tips for following this plan?  Reading food labels Start by checking the serving size on the Nutrition Facts label of packaged foods and drinks. The number of calories and the amount of carbs, fats, and other nutrients listed on the label are based on one serving of the item. Many items contain more than one serving per package. Check the total grams (g) of carbs in one serving. Check the number of grams of saturated fats and trans fats in one serving. Choose foods that have a low amount or none of these fats. Check the number of milligrams (mg) of salt (sodium) in one serving. Most people should limit total sodium intake to less than 2,300 mg per day. Always check the nutrition information of foods labeled as "low-fat" or "nonfat." These foods may be  higher in added sugar or refined carbs and should be avoided. Talk to your dietitian to identify your daily goals for nutrients listed on the label. Shopping Avoid buying canned, pre-made, or processed foods. These foods tend to be high in fat, sodium, and added sugar. Shop around the outside edge of the grocery store. This is where you will most often find fresh fruits and vegetables, bulk grains, fresh meats, and fresh dairy products. Cooking Use low-heat cooking methods, such as baking, instead of high-heat cooking methods, such as deep frying. Cook using healthy oils, such as olive, canola, or sunflower oil. Avoid cooking with butter, cream, or high-fat meats. Meal planning Eat meals and snacks regularly, preferably at the same times every day. Avoid going long periods of time without eating. Eat foods that are  high in fiber, such as fresh fruits, vegetables, beans, and whole grains. Eat 4-6 oz (112-168 g) of lean protein each day, such as lean meat, chicken, fish, eggs, or tofu. One ounce (oz) (28 g) of lean protein is equal to: 1 oz (28 g) of meat, chicken, or fish. 1 egg.  cup (62 g) of tofu. Eat some foods each day that contain healthy fats, such as avocado, nuts, seeds, and fish. What foods should I eat? Fruits Berries. Apples. Oranges. Peaches. Apricots. Plums. Grapes. Mangoes. Papayas. Pomegranates. Kiwi. Cherries. Vegetables Leafy greens, including lettuce, spinach, kale, chard, collard greens, mustard greens, and cabbage. Beets. Cauliflower. Broccoli. Carrots. Green beans. Tomatoes. Peppers. Onions. Cucumbers. Brussels sprouts. Grains Whole grains, such as whole-wheat or whole-grain bread, crackers, tortillas, cereal, and pasta. Unsweetened oatmeal. Quinoa. Brown or wild rice. Meats and other proteins Seafood. Poultry without skin. Lean cuts of poultry and beef. Tofu. Nuts. Seeds. Dairy Low-fat or fat-free dairy products such as milk, yogurt, and cheese. The items listed  above may not be a complete list of foods and beverages you can eat and drink. Contact a dietitian for more information. What foods should I avoid? Fruits Fruits canned with syrup. Vegetables Canned vegetables. Frozen vegetables with butter or cream sauce. Grains Refined white flour and flour products such as bread, pasta, snack foods, and cereals. Avoid all processed foods. Meats and other proteins Fatty cuts of meat. Poultry with skin. Breaded or fried meats. Processed meat. Avoid saturated fats. Dairy Full-fat yogurt, cheese, or milk. Beverages Sweetened drinks, such as soda or iced tea. The items listed above may not be a complete list of foods and beverages you should avoid. Contact a dietitian for more information. Questions to ask a health care provider Do I need to meet with a certified diabetes care and education specialist? Do I need to meet with a dietitian? What number can I call if I have questions? When are the best times to check my blood glucose? Where to find more information: American Diabetes Association: diabetes.org Academy of Nutrition and Dietetics: eatright.[employer] of Diabetes and Digestive and Kidney Diseases: [employer] Association of Diabetes Care & Education Specialists: diabeteseducator.org Summary It is important to have healthy eating habits because your blood sugar (glucose) levels are greatly affected by what you eat and drink. It is important to use alcohol carefully. A healthy meal plan will help you manage your blood glucose and lower your risk of heart disease. Your health care provider may recommend that you work with a dietitian to make a meal plan that is best for you. This information is not intended to replace advice given to you by your health care provider. Make sure you discuss any questions you have with your health care provider. Document Revised: 08/11/2019 Document Reviewed: 08/11/2019 Elsevier Patient Education  [place].   Type 2 Diabetes Mellitus, Self-Care, Adult Caring for yourself after you have been diagnosed with type 2 diabetes (type 2 diabetes mellitus) means keeping your blood sugar (glucose) under control with a balance of: Nutrition. Exercise. Lifestyle changes. Medicines or insulin, if needed. Support from your team of health care providers and others. What are the risks? Having type 2 diabetes can put you at risk for other long-term (chronic) conditions, such as heart disease and kidney disease. Your health care provider may prescribe medicines to help prevent complications from diabetes. How to monitor your blood glucose  Check your blood glucose every day or as often as told by your health  care provider. Have your A1C (hemoglobin A1C) level checked two or more times a year, or as often as told by your health care provider. Your health care provider will set personalized treatment goals for you. Generally, the goal of treatment is to maintain the following blood glucose levels: Before meals: 80-130 mg/dL (4.4-7.2 mmol/L). After meals: below 180 mg/dL (10 mmol/L). A1C level: less than 7%. How to manage hyperglycemia and hypoglycemia Hyperglycemia symptoms Hyperglycemia, also called high blood glucose, occurs when blood glucose is too high. Make sure you know the early signs of hyperglycemia, such as: Increased thirst. Hunger. Feeling very tired. Needing to urinate more often than usual. Blurry vision. Hypoglycemia symptoms Hypoglycemia, also called low blood glucose, occurs with a blood glucose level at or below 70 mg/dL (3.9 mmol/L). Diabetes medicines lower your blood glucose and can cause hypoglycemia. The risk for hypoglycemia increases during or after exercise, during sleep, during illness, and when skipping meals or not eating for a long time (fasting). It is important to know the symptoms of hypoglycemia and treat it right away. Always have a 15-gram rapid-acting  carbohydrate snack with you to treat low blood glucose. Family members and close friends should also know the symptoms and understand how to treat hypoglycemia, in case you are not able to treat yourself. Symptoms may include: Hunger. Anxiety. Sweating and feeling clammy. Dizziness or feeling light-headed. Sleepiness. Increased heart rate. Irritability. Tingling or numbness around the mouth, lips, or tongue. Restless sleep. Severe hypoglycemia is when your blood glucose level is at or below 54 mg/dL (3 mmol/L). Severe hypoglycemia is an emergency. Do not wait to see if the symptoms will go away. Get medical help right away. Call your local emergency services (911 in the [place] Do not drive yourself to the hospital. If you have severe hypoglycemia and you cannot eat or drink, you may need glucagon. A family member or close friend should learn how to check your blood glucose and how to give you glucagon. Ask your health care provider if you need to have an emergency glucagon kit available. Follow these instructions at home: Medicines Take prescribed insulin or diabetes medicines as told by your health care provider. Do not run out of insulin or other diabetes medicines. Plan ahead so you always have these available. If you use insulin, adjust your dosage based on your physical activity and what foods you eat. Your health care provider will tell you how to adjust your dosage. Take over-the-counter and prescription medicines only as told by your health care provider. Eating and drinking  What you eat and drink affects your blood glucose and your insulin dosage. Making good choices helps to control your diabetes and prevent other health problems. A healthy meal plan includes eating lean proteins, complex carbohydrates, fresh fruits and vegetables, low-fat dairy products, and healthy fats. Make an appointment to see a registered dietitian to help you create an eating plan that is right for you. Make  sure that you: Follow instructions from your health care provider about eating or drinking restrictions. Drink enough fluid to keep your urine pale yellow. Keep a record of the carbohydrates that you eat. Do this by reading food labels and learning the standard serving sizes of foods. Follow your sick-day plan whenever you cannot eat or drink as usual. Make this plan in advance with your health care provider.  Activity Stay active. Exercise regularly, as told by your health care provider. This may include: Stretching and doing strength exercises, such as yoga  or weight lifting, two or more times a week. Doing 150 minutes or more of moderate-intensity or vigorous-intensity exercise each week. This could be brisk walking, biking, or water aerobics. Spread out your activity over 3 or more days of the week. Do not go more than 2 days in a row without doing some kind of physical activity. When you start a new exercise or activity, work with your health care provider to adjust your insulin, medicines, or food intake as needed. Lifestyle Do not use any products that contain nicotine or tobacco. These products include cigarettes, chewing tobacco, and vaping devices, such as e-cigarettes. If you need help quitting, ask your health care provider. If you drink alcohol and your health care provider says that it is safe for you: Limit how much you have to: 0-1 drink a day for women who are not pregnant. 0-2 drinks a day for men. Know how much alcohol is in your drink. In the [place] one drink equals one 12 oz bottle of beer (355 mL), one 5 oz glass of wine (148 mL), or one 1 oz glass of hard liquor (44 mL). Learn to manage stress. If you need help with this, ask your health care provider. Take care of your body  Keep your immunizations up to date. In addition to getting vaccinations as told by your health care provider, it is recommended that you get vaccinated against the following illnesses: The flu  (influenza). Get a flu shot every year. Pneumonia. Hepatitis B. Schedule an eye exam soon after your diagnosis, and then one time every year after that. Check your skin and feet every day for cuts, bruises, redness, blisters, or sores. Schedule a foot exam with your health care provider once every year. Brush your teeth and gums two times a day, and floss one or more times a day. Visit your dentist one or more times every 6 months. Maintain a healthy weight. General instructions Share your diabetes management plan with people in your workplace, school, and household. Carry a medical alert card or wear medical alert jewelry. Keep all follow-up visits. This is important. Questions to ask your health care provider Should I meet with a certified diabetes care and education specialist? Where can I find a support group for people with diabetes? Where to find more information For help and guidance and for more information about diabetes, please visit: American Diabetes Association (ADA): www.diabetes.org American Association of Diabetes Care and Education Specialists (ADCES): www.diabeteseducator.org International Diabetes Federation (IDF): MemberVerification.ca Summary Caring for yourself after you have been diagnosed with type 2 diabetes (type 2 diabetes mellitus) means keeping your blood sugar (glucose) under control with a balance of nutrition, exercise, lifestyle changes, and medicine. Check your blood glucose every day, as often as told by your health care provider. Having diabetes can put you at risk for other long-term (chronic) conditions, such as heart disease and kidney disease. Your health care provider may prescribe medicines to help prevent complications from diabetes. Share your diabetes management plan with people in your workplace, school, and household. Keep all follow-up visits. This is important. This information is not intended to replace advice given to you by your health care provider.  Make sure you discuss any questions you have with your health care provider. Document Revised: 06/07/2020 Document Reviewed: 06/07/2020 Elsevier Patient Education  [place],   Merri Ray, MD [place] 02/28/22 9:34 AM

## 2022-04-05 ENCOUNTER — Other Ambulatory Visit: Payer: Self-pay | Admitting: Family Medicine

## 2022-04-05 DIAGNOSIS — E1129 Type 2 diabetes mellitus with other diabetic kidney complication: Secondary | ICD-10-CM

## 2022-04-30 ENCOUNTER — Telehealth: Payer: Self-pay | Admitting: Family Medicine

## 2022-04-30 ENCOUNTER — Telehealth: Payer: Self-pay | Admitting: Pharmacist

## 2022-04-30 DIAGNOSIS — E785 Hyperlipidemia, unspecified: Secondary | ICD-10-CM

## 2022-04-30 DIAGNOSIS — J4530 Mild persistent asthma, uncomplicated: Secondary | ICD-10-CM

## 2022-04-30 NOTE — Telephone Encounter (Signed)
Contacted Lottie Ohlin Watkin to schedule their annual wellness visit. Appointment made for 05/02/2022.  Thank you,  [facility] Support [facility] Direct dial  [phone]

## 2022-04-30 NOTE — Telephone Encounter (Signed)
This patient has been identified as "high risk" and in the top 25% of risk stratification for [facility] accountable patients.  These patients were identified using a number of factors including # of hospitalizations, ED visits, HF exacerbations, elevated BP and A1c, and overall cost of care.   Referral placed for cosign by the PCP.  [facility] team to schedule once cosigned.  Amaree Loisel, PharmD Clinical Pharmacist  [place] [phone]  

## 2022-05-03 ENCOUNTER — Other Ambulatory Visit: Payer: Self-pay | Admitting: Family Medicine

## 2022-05-03 DIAGNOSIS — E785 Hyperlipidemia, unspecified: Secondary | ICD-10-CM

## 2022-05-05 ENCOUNTER — Other Ambulatory Visit: Payer: Self-pay | Admitting: Cardiology

## 2022-05-06 ENCOUNTER — Telehealth: Payer: Self-pay | Admitting: Pharmacist

## 2022-05-06 NOTE — Progress Notes (Unsigned)
Care Management & Coordination Services Pharmacy Team   Reason for Encounter: Chart Prep for initial visit with CPP    Contacted patient to confirm in office appointment with Erskine Emery, PharmD on 05/08/22 at 11AM. {US HC Outreach:28874}  Do you have any problems getting your medications? {yes/no:20286} If yes what types of problems are you experiencing? {Problems:27223}  What is your top health concern you would like to discuss at your upcoming visit?   Have you seen any other providers since your last visit with PCP? {yes/no:20286}   Chart review:  Recent office visits:  ***  Recent consult visits:  ***  Hospital visits:  None in previous 6 months   Star Rating Drugs:  Medication:  Last Fill: Day Supply   Care Gaps: Annual wellness visit in last year? {yes/no:20286}  If Diabetic: Last eye exam / retinopathy screening: Last diabetic foot exam:   Future Appointments  Date Time Provider Department Center  05/07/2022  8:40 AM Jake Bathe, MD [facility]  05/08/2022 11:00 AM Erroll Luna, RPH [facility] None  08/29/2022  8:20 AM Shade Flood, MD [facility]    [facility], [facility]

## 2022-05-07 ENCOUNTER — Ambulatory Visit: Payer: Medicare HMO | Attending: Cardiology | Admitting: Cardiology

## 2022-05-07 VITALS — BP 138/80 | HR 75 | Wt 200.4 lb

## 2022-05-07 DIAGNOSIS — E785 Hyperlipidemia, unspecified: Secondary | ICD-10-CM

## 2022-05-07 DIAGNOSIS — I493 Ventricular premature depolarization: Secondary | ICD-10-CM | POA: Diagnosis not present

## 2022-05-07 DIAGNOSIS — I447 Left bundle-branch block, unspecified: Secondary | ICD-10-CM | POA: Diagnosis not present

## 2022-05-07 DIAGNOSIS — I5022 Chronic systolic (congestive) heart failure: Secondary | ICD-10-CM | POA: Diagnosis not present

## 2022-05-07 NOTE — Patient Instructions (Signed)
Medication Instructions:  Your physician recommends that you continue on your current medications as directed. Please refer to the Current Medication list given to you today.  *If you need a refill on your cardiac medications before your next appointment, please call your pharmacy*   Lab Work: CBC, TSH If you have labs (blood work) drawn today and your tests are completely normal, you will receive your results only by: MyChart Message (if you have MyChart) OR A paper copy in the mail If you have any lab test that is abnormal or we need to change your treatment, we will call you to review the results.   Follow-Up: At [facility], you and your health needs are our priority.  As part of our continuing mission to provide you with exceptional heart care, we have created designated Provider Care Teams.  These Care Teams include your primary Cardiologist (physician) and Advanced Practice Providers (APPs -  Physician Assistants and Nurse Practitioners) who all work together to provide you with the care you need, when you need it.   Your next appointment:   6 month(s)  Provider:   APP then Dr Anne Fu in 1 year

## 2022-05-07 NOTE — Progress Notes (Signed)
Cardiology Office Note:    Date:  05/07/2022   ID:  Sabrina Knight, DOB Feb 20, 1950, MRN [id number]  PCP:  Shade Flood, MD   [facility] Providers Cardiologist:  Donato Schultz, MD     Referring MD: Shade Flood, MD    History of Present Illness:    Sabrina Knight is a 72 y.o. female here for follow-up nonischemic cardiomyopathy EF ranging between 35 and 45%, PVCs 5.3% on ZIO.  She underwent left heart catheterization 07/19/2021 revealing no angiographic evidence of CAD and global LV systolic dysfunction. LVEDP was 16-20 mmHg. LVEF 35-45% by visual estimate. Then had an Echo 08/03/2021 revealing LVEF 30-35%. She was started on Entresto 24/26 mg BID. She also completed a heart monitor showing an average heart rate 74 bpm with sinus rhythm first-degree AV block, and frequent PVCs (5.3%). No atrial fibrillation present, no ventricular tachycardia, no pauses.  Usually she does water aerobics [facility].  Has both asthma and allergies as well and uses Pulmicort.    There is coronary disease history/CABG in one of her brothers and another with atrial fibrillation as well.  She has had trouble with statin intolerance in the past.  Has had lisinopril cough.  Currently taking Crestor 10 mg 5 days/week.  Tolerating.  Used to smoke; quit in 2005.  Still feels fatigued some lethargy.  Wishes she had more energy  Denies any chest pain syncope bleeding orthopnea  Enjoys going to the [facility] musicals.   Past Medical History:  Diagnosis Date   Allergy    Zyrtec, Singulair; allergy testing negative; [facility].   Arthritis    Asthma due to seasonal allergies    Cataract    Clotting disorder (HCC)    Glaucoma    Gout    ITP (idiopathic thrombocytopenic purpura)     Past Surgical History:  Procedure Laterality Date   ABDOMINAL HYSTERECTOMY  01/22/1999   ovaries resected; DUB/fibroids   CARPAL TUNNEL RELEASE Right 09/11/2018   Procedure: CARPAL TUNNEL RELEASE;  Surgeon:  Cindee Salt, MD;  Location: [facility];  Service: Orthopedics;  Laterality: Right;   CARPAL TUNNEL RELEASE Left 10/02/2018   Procedure: CARPAL TUNNEL RELEASE;  Surgeon: Cindee Salt, MD;  Location: [facility];  Service: Orthopedics;  Laterality: Left;  FAB   EYE SURGERY N/A    [employer] 06/26/2019   LEFT HEART CATH AND CORONARY ANGIOGRAPHY N/A 07/19/2021   Procedure: LEFT HEART CATH AND CORONARY ANGIOGRAPHY;  Surgeon: Kathleene Hazel, MD;  Location: [facility];  Service: Cardiovascular;  Laterality: N/A;   spleen removal  01/22/1995   for ITP   spleen removal      Current Medications: Current Meds  Medication Sig   albuterol (VENTOLIN HFA) 108 (90 Base) MCG/ACT inhaler Inhale into the lungs as needed for shortness of breath or wheezing.   allopurinol (ZYLOPRIM) 100 MG tablet TAKE 1 TABLET BY MOUTH TWICE A DAY   aspirin EC 81 MG tablet Take 81 mg by mouth daily. Swallow whole.   blood glucose meter kit and supplies Dispense based on patient and insurance preference. Use once per day.   Blood Glucose Monitoring Suppl (ONE TOUCH ULTRA 2) w/Device KIT CHECK EITHER FASTING OR 2 HOURS AFTER MEALS.   budesonide (PULMICORT) 0.5 MG/2ML nebulizer solution Take by nebulization as needed (shortness of breath).   cetirizine (ZYRTEC) 10 MG tablet Take 10 mg by mouth daily.   co-enzyme Q-10 30 MG capsule Take 30 mg  by mouth daily.   empagliflozin (JARDIANCE) 10 MG TABS tablet Take 1 tablet (10 mg total) by mouth daily before breakfast.   ENTRESTO 24-26 MG Take 1 tablet by mouth 2 (two) times daily.   Glycerin-Polysorbate 80 (REFRESH DRY EYE THERAPY OP) Place 1 drop into both eyes daily as needed (Dry eyes).   Krill Oil 300 MG CAPS Take 300 mg by mouth daily.   magnesium gluconate (MAGONATE) 500 MG tablet Take 500 mg by mouth at bedtime.   metoprolol succinate (TOPROL-XL) 25 MG 24 hr tablet Take 25 mg by mouth daily.   montelukast (SINGULAIR) 10 MG tablet Take 1  tablet (10 mg total) by mouth daily. (Patient taking differently: Take 10 mg by mouth at bedtime.)   Multiple Vitamin (MULTIVITAMIN WITH MINERALS) TABS tablet Take 1 tablet by mouth daily. Woman   rosuvastatin (CRESTOR) 10 MG tablet Take 1 tablet (10 mg total) by mouth daily.   SYMBICORT 80-4.5 MCG/ACT inhaler Inhale 2 puffs into the lungs as needed (Shortness of breath).   Tart Cherry 1200 MG CAPS Take 1,200 mg by mouth at bedtime.   VITAMIN D, CHOLECALCIFEROL, PO Take 500 mg by mouth daily.   vitamin E 180 MG (400 UNITS) capsule Take 400 Units by mouth daily.     Allergies:   Lisinopril   Social History   Socioeconomic History   Marital status: Widowed    Spouse name: Not on file   Number of children: 0   Years of education: Not on file   Highest education level: Not on file  Occupational History   Occupation: Retired  Tobacco Use   Smoking status: Former    Packs/day: .5    Types: Cigarettes    Quit date: 12/04/2003    Years since quitting: 18.4   Smokeless tobacco: Never  Vaping Use   Vaping Use: Never used  Substance and Sexual Activity   Alcohol use: No    Alcohol/week: 0.0 standard drinks of alcohol   Drug use: No   Sexual activity: Not Currently    Birth control/protection: Surgical  Other Topics Concern   Not on file  Social History Narrative   Marital status:  Widowed x since 2001 from melanoma; married x 27 years.  Not dating really in 2019.      Children:  None; failed in vitro.      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      Tobacco: quit ; smoked 20 years.      Alcohol: none      Education: [employer].       Exercise: Yes; 3 times per week 2 hours deep water aerobics.      ADLs: independent ADLs; no assistant devices.      Advanced Directives:  None; FULL CODE; no prolonged measures.    HCPOA: Darlene Jackson/sister-in-law.     Social Determinants of Health   Financial Resource Strain: Low Risk  (04/26/2021)    Overall Financial Resource Strain (CARDIA)    Difficulty of Paying Living Expenses: Not hard at all  Food Insecurity: No Food Insecurity (04/26/2021)   Hunger Vital Sign    Worried About Running Out of Food in the Last Year: Never true    Ran Out of Food in the Last Year: Never true  Transportation Needs: No Transportation Needs (04/26/2021)   PRAPARE - Administrator, Civil Service (Medical): No    Lack of Transportation (Non-Medical):  No  Physical Activity: Sufficiently Active (04/26/2021)   Exercise Vital Sign    Days of Exercise per Week: 3 days    Minutes of Exercise per Session: 60 min  Stress: No Stress Concern Present (04/26/2021)   [employer] of Occupational Health - Occupational Stress Questionnaire    Feeling of Stress : Not at all  Social Connections: Moderately Isolated (04/26/2021)   Social Connection and Isolation Panel [NHANES]    Frequency of Communication with Friends and Family: Twice a week    Frequency of Social Gatherings with Friends and Family: Twice a week    Attends Religious Services: 1 to 4 times per year    Active Member of [employer] or Organizations: No    Attends Banker Meetings: Never    Marital Status: Widowed     Family History: The patient's family history includes COPD in her father; Cancer (age of onset: 73) in her brother; Cancer (age of onset: 76) in her mother; Diabetes in her brother, brother, brother, and sister; Heart disease (age of onset: 58) in her brother; Hyperlipidemia in her mother; Hypertension in her brother, brother, brother, brother, mother, sister, and sister; Mental illness in her mother.  ROS:   Please see the history of present illness.    All other systems reviewed and are negative.  EKGs/Labs/Other Studies Reviewed:    The following studies were reviewed today: Prior office records reviewed  Monitor 08/2021:   Average heart rate 74 bpm with sinus rhythm first-degree AV block.   Interventricular  conduction delay present.   Rare atrial tachycardia   Rare Wenkebach   Rare PACs   Frequent PVCs 5.3%   No atrial fibrillation present, no ventricular tachycardia.  No pauses.   Findings discussed during office visit.     Patch Wear Time:  10 days and 14 hours (2023-07-14T09:24:37-0400 to 2023-07-24T23:49:25-0400)   Patient had a min HR of 21 bpm, max HR of 187 bpm, and avg HR of 74 bpm. Predominant underlying rhythm was Sinus Rhythm. First Degree AV Block was present. Bundle Branch Block/IVCD was present. 4 Supraventricular Tachycardia runs occurred, the run with  the fastest interval lasting 5 beats with a max rate of 187 bpm (avg 163 bpm); the run with the fastest interval was also the longest. Second Degree AV Block-Mobitz I (Wenckebach) was present. Isolated SVEs were rare (<1.0%), SVE Couplets were rare  (<1.0%), and SVE Triplets were rare (<1.0%). Isolated VEs were frequent (5.3%, 59600), VE Couplets were rare (<1.0%, 179), and no VE Triplets were present. Ventricular Bigeminy and Trigeminy were present.  Echo  08/03/2021: Sonographer Comments: Global longitudinal strain was attempted.  IMPRESSIONS    1. Left ventricular ejection fraction, by estimation, is 30 to 35%. The  left ventricle has moderately decreased function. The left ventricle  demonstrates global hypokinesis. The left ventricular internal cavity size  was mildly dilated. There is moderate   left ventricular hypertrophy. Left ventricular diastolic parameters are  consistent with Grade II diastolic dysfunction (pseudonormalization).  Elevated left atrial pressure.   2. Right ventricular systolic function is normal. The right ventricular  size is normal. Tricuspid regurgitation signal is inadequate for assessing  PA pressure.   3. The mitral valve is normal in structure. Mild mitral valve  regurgitation.   4. The aortic valve is tricuspid. Aortic valve regurgitation is trivial.  Aortic valve sclerosis is present,  with no evidence of aortic valve  stenosis.   5. The inferior vena cava is normal in size  with greater than 50%  respiratory variability, suggesting right atrial pressure of 3 mmHg.  Left Heart Cath  07/19/2021:   There is moderate left ventricular systolic dysfunction.   LV end diastolic pressure is mildly elevated.   The left ventricular ejection fraction is 35-45% by visual estimate.   There is no mitral valve regurgitation.   No angiographic evidence of CAD.  Global LV systolic dysfunction.  LVEDP=16-20 mmHg   Recommendations: She will have an outpatient echo for further assessment of LV function. Her LV systolic function appears depressed on the LV gram. She is also having frequent PVCs which may have contributed to her cardiomyopathy. I will review with Dr. Anne Fu. She may benefit from daily Lasix.   Cardiac Studies & Procedures   CARDIAC CATHETERIZATION  CARDIAC CATHETERIZATION 07/19/2021  Narrative   There is moderate left ventricular systolic dysfunction.   LV end diastolic pressure is mildly elevated.   The left ventricular ejection fraction is 35-45% by visual estimate.   There is no mitral valve regurgitation.  No angiographic evidence of CAD. Global LV systolic dysfunction. LVEDP=16-20 mmHg  Recommendations: She will have an outpatient echo for further assessment of LV function. Her LV systolic function appears depressed on the LV gram. She is also having frequent PVCs which may have contributed to her cardiomyopathy. I will review with Dr. Anne Fu. She may benefit from daily Lasix.  Findings Coronary Findings Diagnostic  Dominance: Right  Left Anterior Descending Vessel is large.  Left Circumflex Vessel is large.  Right Coronary Artery Vessel is large.  Intervention  No interventions have been documented.     ECHOCARDIOGRAM  ECHOCARDIOGRAM COMPLETE 11/27/2021  Narrative ECHOCARDIOGRAM REPORT    Patient Name:   Sabrina Knight Date of Exam:  11/27/2021 Medical Rec #:  [id number]        Height:       65.0 in Accession #:    [id number]       Weight:       195.2 lb Date of Birth:  Mar 13, 1950         BSA:          1.958 m Patient Age:    71 years         BP:           132/80 mmHg Patient Gender: F                HR:           60 bpm. Exam Location:  [address]  Procedure: 2D Echo, Cardiac Doppler and Color Doppler  Indications:    I42.8 Non ischemic cardiomyoapthy  History:        Patient has prior history of Echocardiogram examinations, most recent 08/03/2021. Non ischemic cardiomyopathy and CHF, Arrythmias:PVC; Risk Factors:Dyslipidemia.  Sonographer:    Samule Ohm RDCS Referring Phys: [id number] Kali Deadwyler C Martavious Hartel  IMPRESSIONS   1. Left ventricular ejection fraction, by estimation, is 40 to 45%. The left ventricle has moderately decreased function. The left ventricle demonstrates global hypokinesis. There is mild concentric left ventricular hypertrophy. Left ventricular diastolic parameters are consistent with Grade I diastolic dysfunction (impaired relaxation). 2. Right ventricular systolic function is normal. The right ventricular size is normal. 3. The mitral valve is normal in structure. Mild to moderate mitral valve regurgitation. No evidence of mitral stenosis. 4. The aortic valve has an indeterminant number of cusps. Aortic valve regurgitation is not visualized. No aortic stenosis is present. 5. The inferior vena cava is normal  in size with greater than 50% respiratory variability, suggesting right atrial pressure of 3 mmHg.  Comparison(s): Slightly improved.  FINDINGS Left Ventricle: Left ventricular ejection fraction, by estimation, is 40 to 45%. The left ventricle has moderately decreased function. The left ventricle demonstrates global hypokinesis. The left ventricular internal cavity size was normal in size. There is mild concentric left ventricular hypertrophy. Left ventricular diastolic parameters are consistent  with Grade I diastolic dysfunction (impaired relaxation).  Right Ventricle: The right ventricular size is normal. No increase in right ventricular wall thickness. Right ventricular systolic function is normal.  Left Atrium: Left atrial size was normal in size.  Right Atrium: Right atrial size was normal in size.  Pericardium: There is no evidence of pericardial effusion.  Mitral Valve: The mitral valve is normal in structure. Mild to moderate mitral valve regurgitation. No evidence of mitral valve stenosis.  Tricuspid Valve: The tricuspid valve is normal in structure. Tricuspid valve regurgitation is not demonstrated. No evidence of tricuspid stenosis.  Aortic Valve: The aortic valve has an indeterminant number of cusps. Aortic valve regurgitation is not visualized. No aortic stenosis is present.  Pulmonic Valve: The pulmonic valve was normal in structure. Pulmonic valve regurgitation is not visualized. No evidence of pulmonic stenosis.  Aorta: The aortic root is normal in size and structure.  Venous: The inferior vena cava is normal in size with greater than 50% respiratory variability, suggesting right atrial pressure of 3 mmHg.  IAS/Shunts: No atrial level shunt detected by color flow Doppler.   LEFT VENTRICLE PLAX 2D LVIDd:         4.90 cm   Diastology LVIDs:         3.80 cm   LV e' medial:    4.68 cm/s LV PW:         1.30 cm   LV E/e' medial:  14.4 LV IVS:        1.20 cm   LV e' lateral:   6.74 cm/s LVOT diam:     2.10 cm   LV E/e' lateral: 10.0 LV SV:         45 LV SV Index:   23 LVOT Area:     3.46 cm   RIGHT VENTRICLE             IVC RV S prime:     18.90 cm/s  IVC diam: 1.10 cm TAPSE (M-mode): 1.6 cm  LEFT ATRIUM             Index        RIGHT ATRIUM           Index LA diam:        3.70 cm 1.89 cm/m   RA Pressure: 3.00 mmHg LA Vol (A2C):   51.7 ml 26.41 ml/m  RA Area:     11.80 cm LA Vol (A4C):   59.1 ml 30.19 ml/m  RA Volume:   29.50 ml  15.07 ml/m LA  Biplane Vol: 54.6 ml 27.89 ml/m AORTIC VALVE LVOT Vmax:   83.00 cm/s LVOT Vmean:  52.000 cm/s LVOT VTI:    0.129 m  AORTA Ao Root diam: 3.10 cm Ao Asc diam:  3.20 cm  MITRAL VALVE                TRICUSPID VALVE MV Area (PHT): 2.29 cm     Estimated RAP:  3.00 mmHg MV Decel Time: 331 msec MV E velocity: 67.40 cm/s   SHUNTS MV A velocity: 110.00 cm/s  Systemic VTI:  0.13 m MV E/A ratio:  0.61         Systemic Diam: 2.10 cm  Kardie Tobb DO Electronically signed by Thomasene Ripple DO Signature Date/Time: 11/27/2021/12:01:06 PM    Final    MONITORS  LONG TERM MONITOR (3-14 DAYS) 08/31/2021  Narrative   Average heart rate 74 bpm with sinus rhythm first-degree AV block.   Interventricular conduction delay present.   Rare atrial tachycardia   Rare Wenkebach   Rare PACs   Frequent PVCs 5.3%   No atrial fibrillation present, no ventricular tachycardia.  No pauses.   Findings discussed during office visit.   Patch Wear Time:  10 days and 14 hours (2023-07-14T09:24:37-0400 to 2023-07-24T23:49:25-0400)  Patient had a min HR of 21 bpm, max HR of 187 bpm, and avg HR of 74 bpm. Predominant underlying rhythm was Sinus Rhythm. First Degree AV Block was present. Bundle Branch Block/IVCD was present. 4 Supraventricular Tachycardia runs occurred, the run with the fastest interval lasting 5 beats with a max rate of 187 bpm (avg 163 bpm); the run with the fastest interval was also the longest. Second Degree AV Block-Mobitz I (Wenckebach) was present. Isolated SVEs were rare (<1.0%), SVE Couplets were rare (<1.0%), and SVE Triplets were rare (<1.0%). Isolated VEs were frequent (5.3%, 59600), VE Couplets were rare (<1.0%, 179), and no VE Triplets were present. Ventricular Bigeminy and Trigeminy were present.           EKG:  EKG is personally reviewed. 08/31/2021:  EKG was not ordered. 07/18/2021: sinus rhythm left bundle branch block 78 bpm with ventricular bigeminy PVCs frequent.  Recent  Labs: 08/08/2021: Hemoglobin 15.4; Platelets 415.0 08/16/2021: Magnesium 1.9; Pro B Natriuretic peptide (BNP) 159.0; TSH 1.17 02/28/2022: ALT 27; BUN 14; Creatinine, Ser 0.89; Potassium 4.5; Sodium 142   Recent Lipid Panel    Component Value Date/Time   CHOL 169 08/16/2021 0812   CHOL 204 (H) 12/29/2019 1147   TRIG 236.0 (H) 08/16/2021 0812   HDL 41.20 08/16/2021 0812   HDL 52 12/29/2019 1147   CHOLHDL 4 08/16/2021 0812   VLDL 47.2 (H) 08/16/2021 0812   LDLCALC 71 02/07/2021 1125   LDLCALC 120 (H) 12/29/2019 1147   LDLDIRECT 91.0 08/16/2021 0812     Risk Assessment/Calculations:              Physical Exam:    VS:  BP 138/80   Pulse 75   SpO2 96%     Wt Readings from Last 3 Encounters:  02/28/22 197 lb (89.4 kg)  12/04/21 192 lb (87.1 kg)  10/26/21 195 lb 3.2 oz (88.5 kg)     GEN: Well nourished, well developed, in no acute distress HEENT: normal Neck: no JVD, carotid bruits, or masses Cardiac: RRR; no murmurs, rubs, or gallops,no edema  Respiratory:  clear to auscultation bilaterally, normal work of breathing GI: soft, nontender, nondistended, + BS MS: no deformity or atrophy Skin: warm and dry, no rash Neuro:  Alert and Oriented x 3, Strength and sensation are intact Psych: euthymic mood, full affect   ASSESSMENT:    1. Chronic systolic heart failure   2. Hyperlipidemia LDL goal <100   3. PVC's (premature ventricular contractions)   4. Left bundle branch block (LBBB)     PLAN:    In order of problems listed above: Chronic systolic heart failure secondary to nonischemic cardiomyopathy ejection fraction 30 to 35% - Continuing with goal-directed therapy which includes low-dose Entresto, Toprol, Jardiance.  No changes  made.  We did discuss potentially coming off of the low-dose Toprol to see if this helps with her lethargy or fatigue.  After discussion, she desires to continue with this medication.   - I do not want to push up her Toprol because of  Wenkebach - Lab work reviewed.  Checking CBC as well as TSH.  Prior renal function normal  PVC's (premature ventricular contractions) -5% PVCs are noted on monitor.  This is not high enough to cause cardiomyopathy.  Continue with low-dose Toprol.  No changes made.   High cholesterol LDL currently 71 Crestor 10 mg.  Had trouble with higher dosing.  No changes made.    Follow-up:  In 6 months with APP  Medication Adjustments/Labs and Tests Ordered: Current medicines are reviewed at length with the patient today.  Concerns regarding medicines are outlined above.   Orders Placed This Encounter  Procedures   CBC   TSH   No orders of the defined types were placed in this encounter.  Patient Instructions  Medication Instructions:  Your physician recommends that you continue on your current medications as directed. Please refer to the Current Medication list given to you today.  *If you need a refill on your cardiac medications before your next appointment, please call your pharmacy*   Lab Work: CBC, TSH If you have labs (blood work) drawn today and your tests are completely normal, you will receive your results only by: MyChart Message (if you have MyChart) OR A paper copy in the mail If you have any lab test that is abnormal or we need to change your treatment, we will call you to review the results.   Follow-Up: At [facility], you and your health needs are our priority.  As part of our continuing mission to provide you with exceptional heart care, we have created designated Provider Care Teams.  These Care Teams include your primary Cardiologist (physician) and Advanced Practice Providers (APPs -  Physician Assistants and Nurse Practitioners) who all work together to provide you with the care you need, when you need it.   Your next appointment:   6 month(s)  Provider:   APP then Dr Anne Fu in 1 year     Donato Schultz, MD  05/07/2022 9:43 AM    [facility]

## 2022-05-08 ENCOUNTER — Ambulatory Visit: Payer: Medicare HMO | Admitting: Pharmacist

## 2022-05-08 LAB — TSH: TSH: 0.936 u[IU]/mL (ref 0.450–4.500)

## 2022-05-08 LAB — CBC
Hematocrit: 44.5 % (ref 34.0–46.6)
Hemoglobin: 15 g/dL (ref 11.1–15.9)
MCH: 31.1 pg (ref 26.6–33.0)
MCHC: 33.7 g/dL (ref 31.5–35.7)
MCV: 92 fL (ref 79–97)
Platelets: 463 10*3/uL — ABNORMAL HIGH (ref 150–450)
RBC: 4.83 x10E6/uL (ref 3.77–5.28)
RDW: 13.6 % (ref 11.7–15.4)
WBC: 10.2 10*3/uL (ref 3.4–10.8)

## 2022-05-08 NOTE — Progress Notes (Signed)
Care Management & Coordination Services Pharmacy Note  05/08/2022 Name:  Sabrina Knight MRN:  [id number] DOB:  [birth year]  Summary: PharmD Initial visit.  Reviewed all patient meds.  She was having some fatigue which she feels was related to beta blocker dose.  Currently trying half tablet to see if this makes a difference.  Discussed dietary mods for better glucose control (cutting back on sweet tea and chocolate.)  A1c/lipids elevated at last labs.  Has FU with PCP in August.  Recommendations/Changes made from today's visit: Could consider increasing Jardiance to 25mg  if A1c continues to be elevated  Follow up plan: FU 6 months   Subjective: Sabrina Knight is an 72 y.o. year old female who is a primary patient of Shade Flood, MD.  The care coordination team was consulted for assistance with disease management and care coordination needs.    Engaged with patient face to face for initial visit.  Recent office visits:  02/28/22 Sabrina Staggers, MD - Family Medicine - Diabetes - Labs were ordered. Follow up as scheduled.    Recent consult visits:  05/07/22 Donato Schultz MD - Cardiology - CHF - Labs were ordered. No medication changes. Follow up in 6 months.    12/04/21 Sabrina Bridegroom NP - Cardiology - CHF - SYMBICORT 80-4.5 MCG/ACT inhaler prescribed and rosuvastatin (CRESTOR) 10 MG tablet prescribed.    Hospital visits:    Objective:  Lab Results  Component Value Date   CREATININE 0.89 02/28/2022   BUN 14 02/28/2022   GFR 64.65 02/28/2022   EGFR 49 (L) 08/20/2021   GFRNONAA 77 12/29/2019   GFRAA 89 12/29/2019   NA 142 02/28/2022   K 4.5 02/28/2022   CALCIUM 9.9 02/28/2022   CO2 28 02/28/2022   GLUCOSE 119 (H) 02/28/2022    Lab Results  Component Value Date/Time   HGBA1C 7.3 (H) 02/28/2022 09:39 AM   HGBA1C 7.0 (H) 11/19/2021 10:16 AM   GFR 64.65 02/28/2022 09:39 AM   GFR 66.57 11/19/2021 10:16 AM   MICROALBUR 1.8 08/16/2021 08:26 AM    Last diabetic Eye  exam:  Lab Results  Component Value Date/Time   HMDIABEYEEXA No Retinopathy 06/14/2021 12:00 AM    Last diabetic Foot exam: No results found for: "HMDIABFOOTEX"   Lab Results  Component Value Date   CHOL 169 08/16/2021   HDL 41.20 08/16/2021   LDLCALC 71 02/07/2021   LDLDIRECT 91.0 08/16/2021   TRIG 236.0 (H) 08/16/2021   CHOLHDL 4 08/16/2021       Latest Ref Rng & Units 02/28/2022    9:39 AM 11/19/2021   10:16 AM 08/16/2021    8:12 AM  Hepatic Function  Total Protein 6.0 - 8.3 g/dL 7.3  7.2  7.2   Albumin 3.5 - 5.2 g/dL 4.2  4.1  4.1   AST 0 - 37 U/L 24  24  42   ALT 0 - 35 U/L 27  26  47   Alk Phosphatase 39 - 117 U/L 82  77  85   Total Bilirubin 0.2 - 1.2 mg/dL 0.4  0.4  0.5     Lab Results  Component Value Date/Time   TSH 0.936 05/07/2022 09:10 AM   TSH 1.17 08/16/2021 08:12 AM       Latest Ref Rng & Units 05/07/2022    9:10 AM 08/08/2021    7:58 AM 07/12/2021    5:36 PM  CBC  WBC 3.4 - 10.8 x10E3/uL 10.2  10.6  13.6  Hemoglobin 11.1 - 15.9 g/dL 16.1  09.6  04.5   Hematocrit 34.0 - 46.6 % 44.5  46.2  45.2   Platelets 150 - 450 x10E3/uL 463  415.0  386.0     No results found for: "VD25OH", "VITAMINB12"  Clinical ASCVD: No  The 10-year ASCVD risk score (Arnett DK, et al., 2019) is: 44.8%   Values used to calculate the score:     Age: 34 years     Sex: Female     Is Non-Hispanic African American: No     Diabetic: Yes     Tobacco smoker: Yes     Systolic Blood Pressure: 138 mmHg     Is BP treated: Yes     HDL Cholesterol: 41.2 mg/dL     Total Cholesterol: 169 mg/dL        4/0/9811    9:14 AM 10/26/2021    9:13 AM 10/03/2021    9:45 AM  Depression screen PHQ 2/9  Decreased Interest 0 0 0  Down, Depressed, Hopeless 0 0 0  PHQ - 2 Score 0 0 0  Altered sleeping  0 0  Tired, decreased energy  0 0  Change in appetite  0 0  Feeling bad or failure about yourself   0 0  Trouble concentrating  0 0  Moving slowly or fidgety/restless  0 0  Suicidal  thoughts  0 0  PHQ-9 Score  0 0     Social History   Tobacco Use  Smoking Status Former   Packs/day: .5   Types: Cigarettes   Quit date: 12/04/2003   Years since quitting: 18.4  Smokeless Tobacco Never   BP Readings from Last 3 Encounters:  05/07/22 138/80  02/28/22 122/72  12/04/21 122/68   Pulse Readings from Last 3 Encounters:  05/07/22 75  02/28/22 86  12/04/21 62   Wt Readings from Last 3 Encounters:  05/07/22 200 lb 6.4 oz (90.9 kg)  02/28/22 197 lb (89.4 kg)  12/04/21 192 lb (87.1 kg)   BMI Readings from Last 3 Encounters:  05/07/22 33.87 kg/m  02/28/22 33.29 kg/m  12/04/21 32.45 kg/m    Allergies  Allergen Reactions   Lisinopril Cough    Medications Reviewed Today     Reviewed by Erroll Luna, [facility] (Pharmacist) on 05/08/22 at 1554  Med List Status: <None>   Medication Order Taking? Sig Documenting Provider Last Dose Status Informant  albuterol (VENTOLIN HFA) 108 (90 Base) MCG/ACT inhaler [id number] Yes Inhale into the lungs as needed for shortness of breath or wheezing. [provider] Taking Active   allopurinol (ZYLOPRIM) 100 MG tablet [id number] Yes TAKE 1 TABLET BY MOUTH TWICE A DAY Shade Flood, MD Taking Active   aspirin EC 81 MG tablet [id number] Yes Take 81 mg by mouth daily. Swallow whole. [provider] Taking Active Self  blood glucose meter kit and supplies [id number] Yes Dispense based on patient and insurance preference. Use once per day. Shade Flood, MD Taking Active   Blood Glucose Monitoring Suppl (ONE TOUCH ULTRA 2) w/Device Andria Rhein [id number] Yes CHECK EITHER FASTING OR 2 HOURS AFTER MEALS. Shade Flood, MD Taking Active   budesonide (PULMICORT) 0.5 MG/2ML nebulizer solution [id number] Yes Take by nebulization as needed (shortness of breath). [provider] Taking Active   cetirizine (ZYRTEC) 10 MG tablet [id number] Yes Take 10 mg by mouth daily. [provider] Taking Active Self   co-enzyme Q-10 30 MG capsule [id number] Yes Take 30 mg by mouth  daily. [provider] Taking Active Self  empagliflozin (JARDIANCE) 10 MG TABS tablet [id number] Yes Take 1 tablet (10 mg total) by mouth daily before breakfast. Jake Bathe, MD Taking Active   ENTRESTO 24-26 [place] [id number] Yes Take 1 tablet by mouth 2 (two) times daily. [provider] Taking Active   Glycerin-Polysorbate 80 (REFRESH DRY EYE THERAPY OP) [id number] Yes Place 1 drop into both eyes daily as needed (Dry eyes). [provider] Taking Active Self  Krill Oil 300 MG CAPS [id number] Yes Take 300 mg by mouth daily. [provider] Taking Active Self  magnesium gluconate (MAGONATE) 500 MG tablet [id number] Yes Take 500 mg by mouth at bedtime. [provider] Taking Active Self  metoprolol succinate (TOPROL-XL) 25 MG 24 hr tablet [id number] Yes TAKE 1 TABLET (25 MG TOTAL) BY MOUTH DAILY. Jake Bathe, MD Taking Active            Med Note Earlene Plater, Leotis Isham L   Wed May 08, 2022  3:54 PM) Taking half tablet daily  montelukast (SINGULAIR) 10 MG tablet [id number] Yes Take 1 tablet (10 mg total) by mouth daily.  Patient taking differently: Take 10 mg by mouth at bedtime.   Shade Flood, MD Taking Active Self  Multiple Vitamin (MULTIVITAMIN WITH MINERALS) TABS tablet [id number] Yes Take 1 tablet by mouth daily. Woman [provider] Taking Active Self  rosuvastatin (CRESTOR) 10 MG tablet [id number] Yes Take 1 tablet (10 mg total) by mouth daily. Swinyer, Zachary George, NP Taking Active   SYMBICORT 80-4.5 MCG/ACT inhaler [id number] Yes Inhale 2 puffs into the lungs as needed (Shortness of breath). [provider] Taking Active Self  Tart Cherry 1200 MG CAPS [id number] Yes Take 1,200 mg by mouth at bedtime. [provider] Taking Active Self  VITAMIN D, CHOLECALCIFEROL, PO [id number] Yes Take 500 mg by mouth daily. [provider] Taking Active Self  vitamin E 180  MG (400 UNITS) capsule [id number] Yes Take 400 Units by mouth daily. [provider] Taking Active Self            SDOH:  (Social Determinants of Health) assessments and interventions performed: Yes Financial Resource Strain: Low Risk  (05/08/2022)   Overall Financial Resource Strain (CARDIA)    Difficulty of Paying Living Expenses: Not hard at all   Food Insecurity: No Food Insecurity (05/08/2022)   Hunger Vital Sign    Worried About Running Out of Food in the Last Year: Never true    Ran Out of Food in the Last Year: Never true    SDOH Interventions    Flowsheet Row Clinical Support from 04/26/2021 in [facility] [employer] at [employer]  SDOH Interventions   Food Insecurity Interventions Intervention Not Indicated  Housing Interventions Intervention Not Indicated  Transportation Interventions Intervention Not Indicated  Financial Strain Interventions Intervention Not Indicated  Physical Activity Interventions Intervention Not Indicated  Stress Interventions Intervention Not Indicated  Social Connections Interventions Intervention Not Indicated       Medication Assistance: None required.  Patient affirms current coverage meets needs.  Medication Access: Within the past 30 days, how often has patient missed a dose of medication? 0 Is a pillbox or other method used to improve adherence? Yes  Factors that may affect medication adherence? no barriers identified Are meds synced by current pharmacy? No  Are meds delivered by current pharmacy? No  Does patient experience delays in picking up medications due to transportation concerns? No   [facility]  Reviewed: Is patient disadvantaged to use [facility]?: Yes  Current Rx insurance plan: [employer] Name and location of Current pharmacy:  [facility] [phone] Gin[place] Otto, [place] - [address] [place] [address] Phone: [phone] Fax: [phone]  [facility]  Pharmacy services reviewed with patient today?: Yes  Patient requests to transfer care to [facility]?: No  Reason patient declined to change pharmacies: Disadvantaged due to insurance/mail order  Compliance/Adherence/Medication fill history: [facility] Drugs:  Medication:                                        Last Fill:         Day Supply   empagliflozin 10 MG TABS tablet      02/20/22            90 Rosuvastatin 10 MG tablet                 02/14/22            84   Care Gaps: Annual wellness visit in last year? No last 04/26/21   Assessment/Plan   Hyperlipidemia: (LDL goal < 70) History of statin intolerance, but currently on Crestor LDL most recently 91 -Uncontrolled -Current treatment: Rosuvastatin 10mg  Appropriate, Query effective, ,  -Medications previously tried: had not tolerated higher doses of statins  -Current dietary patterns: reports she eats sweets (chocolate) and sweet tea -Current exercise habits: exercises in the water three times per week doing aerobics -Educated on Cholesterol goals;  Benefits of statin for ASCVD risk reduction; Importance of limiting foods high in cholesterol; -Recommended to continue current medication Recheck lipids routinely, could consider increased dose of statin next lipids if patient is willing.  She is taking daily although her rx is only for 5 days per week.  Heart Failure (Goal: manage symptoms and prevent exacerbations) -Controlled -Last ejection fraction: 30-35% -HF type: HFrEF (EF < 40%) -NYHA Class: I (no actitivty limitation) -AHA HF Stage: B (Heart disease present - no symptoms present) -Current treatment: Jardiance 10mg  Appropriate, Effective, Safe, Accessible Metoprolol XL 25mg  Appropriate, Effective, Safe, Accessible Entresto 24-26 bid Appropriate, Effective, Safe, Accessible -Medications previously tried: none noted  -Current home BP/HR readings: normal, no logs discussed -Current home daily weights: stable, she  is monitoring daily -Current dietary habits: see HTN -Current exercise habits: see HTN -Educated on Benefits of medications for managing symptoms and prolonging life Importance of weighing daily; if you gain more than 3 pounds in one day or 5 pounds in one week, contact providers -Recommended to continue current medication Denies any SOB at rest since she started these meds.  No reason to change as she seems to be well controlled.  All meds are affordable to the patient at this time.   She does report some fatigue and is trialing cutting her toprolol in half at the moment.  Will FU on this in 2-4 weeks.   Diabetes (A1c goal <7%) -Uncontrolled, A1c was 7.3% -Current medications: Jardiance 10mg  Appropriate, Query effective, ,  -Medications previously tried: none noted  -Current home glucose readings fasting glucose: reports 90-120  post prandial glucose: 183, have asked her to check these more randomly throughout the week -Denies hypoglycemic/hyperglycemic symptoms  -Current exercise: water aerobics three times per week -Educated on A1c and blood sugar goals; Complications of diabetes including kidney damage, retinal damage, and cardiovascular disease; Exercise goal of 150  minutes per week; Prevention and management of hypoglycemic episodes; -Counseled to check feet daily and get yearly eye exams -Recommended to continue current medication Recheck A1c at August visit with PCP - if still elevated could consider increasing Jardiance to  for better glycemic control.  Asthma (Goal: control symptoms and prevent exacerbations) -Controlled -Current treatment  Symbicort 80-4.5 mcg prn Appropriate, Effective, Safe, Accessible Budesonide 0.5mg /22ml soln Appropriate, Effective, Safe, Accessible -Medications previously tried: none noted  -Exacerbations requiring treatment in last 6 months: none -Counseled on Proper inhaler technique; When to use rescue inhaler -Recommended to continue  current medication She rarely has to use her inhalers.  Report worsening symptoms to providers.  Osteopenia (Goal Prevent fracture) -Controlled -Last DEXA Scan: 10/2021  -DEXA shows osteopenia -Patient is not a candidate for pharmacologic treatment -Current treatment  None noted -Medications previously tried: none noted  -Recommend [phone] units of vitamin D daily. Recommend 1200 mg of calcium daily from dietary and supplemental sources. Recommend weight-bearing and muscle strengthening exercises for building and maintaining bone density. -Recommended to continue current medication Continue water aerobics and current active lifestyle.  PVCs (Goal: Reduce frequency) -Controlled -Current treatment  Metoprolol XL  Appropriate, Effective, Safe, Accessible -Medications previously tried: none noted -Control PVC's to reduce worsening HF.  -Recommended to continue current medication No changes necessary at this time, continue to follow regularly with cardiology.  Willa Frater, PharmD Clinical Pharmacist  [facility] [phone]

## 2022-05-09 ENCOUNTER — Telehealth: Payer: Self-pay | Admitting: Family Medicine

## 2022-05-09 NOTE — Telephone Encounter (Signed)
Called patient to schedule Medicare Annual Wellness Visit (AWV). Left message for patient to call back and schedule Medicare Annual Wellness Visit (AWV).  Last date of AWV: 04/26/2021   Please schedule an AWVS appointment at any time with [facility] ANNUAL WELLNESS VISIT.  If any questions, please contact me at [phone].    Thank you,  Madilynne Mullan  Ambulatory Clinic Support [facility] Direct dial  [phone]   

## 2022-05-19 ENCOUNTER — Other Ambulatory Visit: Payer: Self-pay | Admitting: Family Medicine

## 2022-05-19 DIAGNOSIS — E785 Hyperlipidemia, unspecified: Secondary | ICD-10-CM

## 2022-05-20 NOTE — Telephone Encounter (Signed)
Another provider wrote the original Rx pt would like to see if you would refill ?

## 2022-05-20 NOTE — Telephone Encounter (Signed)
Recently discussed at cardiology, continue 10 mg.  I will refill for now.

## 2022-05-21 ENCOUNTER — Other Ambulatory Visit: Payer: Self-pay | Admitting: Family Medicine

## 2022-05-21 DIAGNOSIS — E1129 Type 2 diabetes mellitus with other diabetic kidney complication: Secondary | ICD-10-CM

## 2022-05-28 DIAGNOSIS — E1122 Type 2 diabetes mellitus with diabetic chronic kidney disease: Secondary | ICD-10-CM | POA: Diagnosis not present

## 2022-05-28 DIAGNOSIS — I509 Heart failure, unspecified: Secondary | ICD-10-CM | POA: Diagnosis not present

## 2022-05-28 DIAGNOSIS — E669 Obesity, unspecified: Secondary | ICD-10-CM | POA: Diagnosis not present

## 2022-05-28 DIAGNOSIS — I429 Cardiomyopathy, unspecified: Secondary | ICD-10-CM | POA: Diagnosis not present

## 2022-05-28 DIAGNOSIS — Z87891 Personal history of nicotine dependence: Secondary | ICD-10-CM | POA: Diagnosis not present

## 2022-05-28 DIAGNOSIS — R32 Unspecified urinary incontinence: Secondary | ICD-10-CM | POA: Diagnosis not present

## 2022-05-28 DIAGNOSIS — Z809 Family history of malignant neoplasm, unspecified: Secondary | ICD-10-CM | POA: Diagnosis not present

## 2022-05-28 DIAGNOSIS — J302 Other seasonal allergic rhinitis: Secondary | ICD-10-CM | POA: Diagnosis not present

## 2022-05-28 DIAGNOSIS — E785 Hyperlipidemia, unspecified: Secondary | ICD-10-CM | POA: Diagnosis not present

## 2022-05-28 DIAGNOSIS — I130 Hypertensive heart and chronic kidney disease with heart failure and stage 1 through stage 4 chronic kidney disease, or unspecified chronic kidney disease: Secondary | ICD-10-CM | POA: Diagnosis not present

## 2022-05-28 DIAGNOSIS — Z8249 Family history of ischemic heart disease and other diseases of the circulatory system: Secondary | ICD-10-CM | POA: Diagnosis not present

## 2022-05-28 DIAGNOSIS — M1990 Unspecified osteoarthritis, unspecified site: Secondary | ICD-10-CM | POA: Diagnosis not present

## 2022-06-05 ENCOUNTER — Other Ambulatory Visit: Payer: Self-pay | Admitting: Family Medicine

## 2022-06-05 DIAGNOSIS — R809 Proteinuria, unspecified: Secondary | ICD-10-CM

## 2022-06-24 ENCOUNTER — Telehealth: Payer: Self-pay | Admitting: Family Medicine

## 2022-08-13 ENCOUNTER — Other Ambulatory Visit: Payer: Self-pay | Admitting: Cardiology

## 2022-08-13 ENCOUNTER — Other Ambulatory Visit: Payer: Self-pay | Admitting: Family Medicine

## 2022-08-13 DIAGNOSIS — M109 Gout, unspecified: Secondary | ICD-10-CM

## 2022-08-13 DIAGNOSIS — E1129 Type 2 diabetes mellitus with other diabetic kidney complication: Secondary | ICD-10-CM

## 2022-08-13 MED ORDER — EMPAGLIFLOZIN 10 MG PO TABS: 10.0000 mg | ORAL_TABLET | Freq: Every day | ORAL | 2 refills | Status: DC

## 2022-08-24 ENCOUNTER — Other Ambulatory Visit: Payer: Self-pay | Admitting: Cardiology

## 2022-08-24 ENCOUNTER — Other Ambulatory Visit: Payer: Self-pay | Admitting: Family Medicine

## 2022-08-24 DIAGNOSIS — E785 Hyperlipidemia, unspecified: Secondary | ICD-10-CM

## 2022-08-29 ENCOUNTER — Ambulatory Visit (INDEPENDENT_AMBULATORY_CARE_PROVIDER_SITE_OTHER): Payer: Medicare HMO | Admitting: Family Medicine

## 2022-08-29 ENCOUNTER — Encounter: Payer: Self-pay | Admitting: Family Medicine

## 2022-08-29 VITALS — BP 124/78 | HR 65 | Temp 98.2°F | Resp 18 | Ht 64.5 in | Wt 199.0 lb

## 2022-08-29 DIAGNOSIS — R809 Proteinuria, unspecified: Secondary | ICD-10-CM

## 2022-08-29 DIAGNOSIS — I5020 Unspecified systolic (congestive) heart failure: Secondary | ICD-10-CM | POA: Diagnosis not present

## 2022-08-29 DIAGNOSIS — Z7984 Long term (current) use of oral hypoglycemic drugs: Secondary | ICD-10-CM

## 2022-08-29 DIAGNOSIS — E1129 Type 2 diabetes mellitus with other diabetic kidney complication: Secondary | ICD-10-CM

## 2022-08-29 LAB — COMPREHENSIVE METABOLIC PANEL
ALT: 33 U/L (ref 0–35)
AST: 24 U/L (ref 0–37)
Albumin: 4.3 g/dL (ref 3.5–5.2)
Alkaline Phosphatase: 77 U/L (ref 39–117)
BUN: 17 mg/dL (ref 6–23)
CO2: 27 mEq/L (ref 19–32)
Calcium: 10 mg/dL (ref 8.4–10.5)
Chloride: 106 mEq/L (ref 96–112)
Creatinine, Ser: 0.85 mg/dL (ref 0.40–1.20)
GFR: 68.08 mL/min (ref >60.00)
Glucose, Bld: 116 mg/dL — ABNORMAL HIGH (ref 70–99)
Potassium: 4.1 mEq/L (ref 3.5–5.1)
Sodium: 142 mEq/L (ref 135–145)
Total Bilirubin: 0.4 mg/dL (ref 0.2–1.2)
Total Protein: 7.5 g/dL (ref 6.0–8.3)

## 2022-08-29 LAB — LIPID PANEL
Cholesterol: 182 mg/dL (ref 0–200)
HDL: 55.1 mg/dL (ref >39.00)
LDL Cholesterol: 92 mg/dL (ref 0–99)
NonHDL: 126.84
Total CHOL/HDL Ratio: 3
Triglycerides: 174 mg/dL — ABNORMAL HIGH (ref 0.0–149.0)
VLDL: 34.8 mg/dL (ref 0.0–40.0)

## 2022-08-29 LAB — HEMOGLOBIN A1C: Hgb A1c MFr Bld: 7.1 % — ABNORMAL HIGH (ref 4.6–6.5)

## 2022-08-29 NOTE — Progress Notes (Signed)
Subjective:  Patient ID: Sabrina Knight, female    DOB: [birth year]  Age: 72 y.o. MRN: [id number]  CC:  Chief Complaint  Patient presents with   Diabetes    6 month follow up on diabetes  Doing well per pt  No concerns at this time     HPI Sabrina Knight presents for   Diabetes: With microalbuminuria, chronic HFrEF/NICM.  Started on Jardiance 10 mg daily in August of last year and she is on statin, ARB.  Slight increased A1c with slight increased weight at her last visit.  No home readings at that time.  She was trying to cut back on red meat and eating more salads and veggies, along with more water, limiting sweet tea, and was exercising 3 times per week.  Weight overall stable from most recent readings. Cardiology visit noted from April 16.  Continued on low-dose Entresto, Toprol, Jardiance.  No changes made.  Option to stop metoprolol for lethargy or fatigue.  Continue same dose of Crestor as difficulty with higher dosing.  59-month follow-up.  No recent UTI or mycotic infection sx's.  Still watching diet.  Home readings:  Fasting: 100-115, 150.  Postprandial: 190 No sx lows.  Microalbumin: Normal ratio on 08/16/2021, repeat due, but unable to provide urine today.  Optho, foot exam, pneumovax:  Ophthalmology exam: plans on new optho  Exercise - 3 times per week, 1.5 hrs - water exercise.  Lab Results  Component Value Date   HGBA1C 7.3 (H) 02/28/2022   HGBA1C 7.0 (H) 11/19/2021   HGBA1C 7.6 (H) 08/16/2021   Lab Results  Component Value Date   MICROALBUR 1.8 08/16/2021   LDLCALC 71 02/07/2021   CREATININE 0.89 02/28/2022   Wt Readings from Last 3 Encounters:  08/29/22 199 lb (90.3 kg)  05/07/22 200 lb 6.4 oz (90.9 kg)  02/28/22 197 lb (89.4 kg)     History Patient Active Problem List   Diagnosis Date Noted   Shortness of breath    Precordial chest pain 07/18/2021   PVC's (premature ventricular contractions) 07/18/2021   Degenerative disc disease, lumbar  07/12/2020   Degenerative arthritis of left knee 05/26/2020   Greater trochanteric bursitis of both hips 05/26/2020   Mild persistent asthma, uncomplicated 01/08/2019   Vasomotor rhinitis 01/08/2019   Other allergic rhinitis 01/08/2019   Bilateral carpal tunnel syndrome 09/25/2018   Dyslipidemia 10/22/2017   Statin intolerance 10/22/2017   Newly diagnosed diabetes (HCC) 10/22/2017   Gout 12/23/2016   Class 1 obesity due to excess calories with serious comorbidity and body mass index (BMI) of 31.0 to 31.9 in adult 05/19/2016   Osteopenia of necks of both femurs 05/19/2016   Thrombocytosis 02/08/2014   High cholesterol 02/01/2013   H/O splenectomy 02/01/2013   Non-allergic rhinitis 02/01/2013   Glaucoma 02/01/2013   Past Medical History:  Diagnosis Date   Allergy    Zyrtec, Singulair; allergy testing negative; [facility].   Arthritis    Asthma due to seasonal allergies    Cataract    Clotting disorder (HCC)    Glaucoma    Gout    ITP (idiopathic thrombocytopenic purpura)    Past Surgical History:  Procedure Laterality Date   ABDOMINAL HYSTERECTOMY  01/22/1999   ovaries resected; DUB/fibroids   CARPAL TUNNEL RELEASE Right 09/11/2018   Procedure: CARPAL TUNNEL RELEASE;  Surgeon: Cindee Salt, MD;  Location: [facility];  Service: Orthopedics;  Laterality: Right;   CARPAL TUNNEL RELEASE Left 10/02/2018   Procedure: CARPAL  TUNNEL RELEASE;  Surgeon: Cindee Salt, MD;  Location: [facility];  Service: Orthopedics;  Laterality: Left;  FAB   EYE SURGERY N/A    [facility] 06/26/2019   LEFT HEART CATH AND CORONARY ANGIOGRAPHY N/A 07/19/2021   Procedure: LEFT HEART CATH AND CORONARY ANGIOGRAPHY;  Surgeon: Kathleene Hazel, MD;  Location: [facility];  Service: Cardiovascular;  Laterality: N/A;   spleen removal  01/22/1995   for ITP   spleen removal     Allergies  Allergen Reactions   Lisinopril Cough   Prior to Admission medications   Medication Sig  Start Date End Date Taking? Authorizing Provider  allopurinol (ZYLOPRIM) 100 MG tablet TAKE 1 TABLET BY MOUTH TWICE A DAY 08/13/22  Yes Shade Flood, MD  aspirin EC 81 MG tablet Take 81 mg by mouth daily. Swallow whole.   Yes [provider]  blood glucose meter kit and supplies Dispense based on patient and insurance preference. Use once per day. 10/26/21  Yes Shade Flood, MD  Blood Glucose Monitoring Suppl (ONE TOUCH ULTRA 2) w/Device KIT CHECK EITHER FASTING OR 2 HOURS AFTER MEALS. 04/05/22  Yes Shade Flood, MD  cetirizine (ZYRTEC) 10 MG tablet Take 10 mg by mouth daily.   Yes [provider]  co-enzyme Q-10 30 MG capsule Take 30 mg by mouth daily.   Yes [provider]  empagliflozin (JARDIANCE) 10 MG TABS tablet Take 1 tablet (10 mg total) by mouth daily before breakfast. 08/13/22  Yes Jake Bathe, MD  Glycerin-Polysorbate 80 (REFRESH DRY EYE THERAPY OP) Place 1 drop into both eyes daily as needed (Dry eyes).   Yes [provider]  Boris Lown Oil 300 MG CAPS Take 300 mg by mouth daily.   Yes [provider]  Lancets (ONETOUCH DELICA PLUS LANCET33G) MISC 1 EACH BY DOES NOT APPLY ROUTE DAILY. 05/21/22  Yes Shade Flood, MD  magnesium gluconate (MAGONATE) 500 MG tablet Take 500 mg by mouth at bedtime.   Yes [provider]  metoprolol succinate (TOPROL-XL) 25 MG 24 hr tablet TAKE 1 TABLET (25 MG TOTAL) BY MOUTH DAILY. 05/07/22  Yes Jake Bathe, MD  montelukast (SINGULAIR) 10 MG tablet Take 1 tablet (10 mg total) by mouth daily. Patient taking differently: Take 10 mg by mouth at bedtime. 02/07/21  Yes Shade Flood, MD  Multiple Vitamin (MULTIVITAMIN WITH MINERALS) TABS tablet Take 1 tablet by mouth daily. Woman   Yes [provider]  [facility] ULTRA test strip USE 1 STRIP DAILY AS DIRECTED E11.29 08/13/22  Yes Shade Flood, MD  rosuvastatin (CRESTOR) 10 MG tablet TAKE 1 TABLET BY MOUTH DAILY IN THE EVENING 5 DAYS  PER WEEK 08/26/22  Yes Shade Flood, MD  sacubitril-valsartan (ENTRESTO) 24-26 MG TAKE 1 TABLET BY MOUTH TWICE A DAY 08/26/22  Yes Jake Bathe, MD  SYMBICORT 80-4.5 MCG/ACT inhaler Inhale 2 puffs into the lungs as needed (Shortness of breath). 06/30/21  Yes [provider]  Tart Cherry 1200 MG CAPS Take 1,200 mg by mouth at bedtime.   Yes [provider]  VITAMIN D, CHOLECALCIFEROL, PO Take 500 mg by mouth daily.   Yes [provider]  vitamin E 180 MG (400 UNITS) capsule Take 400 Units by mouth daily.   Yes [provider]  budesonide (PULMICORT) 0.5 MG/2ML nebulizer solution Take by nebulization as needed (shortness of breath). Patient not taking: Reported on 08/29/2022 04/09/21   [provider]  Social History   Socioeconomic History   Marital status: Widowed    Spouse name: Not on file   Number of children: 0   Years of education: Not on file   Highest education level: Not on file  Occupational History   Occupation: Retired  Tobacco Use   Smoking status: Former    Current packs/day: 0.00    Types: Cigarettes    Quit date: 12/04/2003    Years since quitting: 18.7   Smokeless tobacco: Never  Vaping Use   Vaping status: Never Used  Substance and Sexual Activity   Alcohol use: No    Alcohol/week: 0.0 standard drinks of alcohol   Drug use: No   Sexual activity: Not Currently    Birth control/protection: Surgical  Other Topics Concern   Not on file  Social History Narrative   Marital status:  Widowed x since 2001 from melanoma; married x 27 years.  Not dating really in 2019.      Children:  None; failed in vitro.      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      Tobacco: quit ; smoked 20 years.      Alcohol: none      Education: [employer].       Exercise: Yes; 3 times per week 2 hours deep water aerobics.      ADLs: independent ADLs; no assistant devices.      Advanced Directives:   None; FULL CODE; no prolonged measures.    HCPOA: Darlene Jackson/sister-in-law.     Social Determinants of Health   Financial Resource Strain: Low Risk  (05/08/2022)   Overall Financial Resource Strain (CARDIA)    Difficulty of Paying Living Expenses: Not hard at all  Food Insecurity: No Food Insecurity (05/08/2022)   Hunger Vital Sign    Worried About Running Out of Food in the Last Year: Never true    Ran Out of Food in the Last Year: Never true  Transportation Needs: No Transportation Needs (04/26/2021)   PRAPARE - Administrator, Civil Service (Medical): No    Lack of Transportation (Non-Medical): No  Physical Activity: Sufficiently Active (04/26/2021)   Exercise Vital Sign    Days of Exercise per Week: 3 days    Minutes of Exercise per Session: 60 min  Stress: No Stress Concern Present (04/26/2021)   [employer] of Occupational Health - Occupational Stress Questionnaire    Feeling of Stress : Not at all  Social Connections: Moderately Isolated (04/26/2021)   Social Connection and Isolation Panel [NHANES]    Frequency of Communication with Friends and Family: Twice a week    Frequency of Social Gatherings with Friends and Family: Twice a week    Attends Religious Services: 1 to 4 times per year    Active Member of [employer] or Organizations: No    Attends Banker Meetings: Never    Marital Status: Widowed  Intimate Partner Violence: Not At Risk (04/26/2021)   Humiliation, Afraid, Rape, and Kick questionnaire    Fear of Current or Ex-Partner: No    Emotionally Abused: No    Physically Abused: No    Sexually Abused: No    Review of Systems  Constitutional:  Negative for fatigue and unexpected weight change.  Respiratory:  Negative for chest tightness and shortness of breath.   Cardiovascular:  Negative for chest pain, palpitations and leg swelling.  Gastrointestinal:  Negative for abdominal  pain and blood in stool.  Neurological:  Negative for dizziness,  syncope, light-headedness and headaches.     Objective:   Vitals:   08/29/22 0812  BP: 124/78  Pulse: 65  Resp: 18  Temp: 98.2 F (36.8 C)  TempSrc: Temporal  SpO2: 96%  Weight: 199 lb (90.3 kg)  Height: 5' 4.5" (1.638 m)     Physical Exam Vitals reviewed.  Constitutional:      Appearance: Normal appearance. She is well-developed.  HENT:     Head: Normocephalic and atraumatic.  Eyes:     Conjunctiva/sclera: Conjunctivae normal.     Pupils: Pupils are equal, round, and reactive to light.  Neck:     Vascular: No carotid bruit.  Cardiovascular:     Rate and Rhythm: Normal rate and regular rhythm.     Heart sounds: Normal heart sounds.  Pulmonary:     Effort: Pulmonary effort is normal.     Breath sounds: Normal breath sounds.  Abdominal:     Palpations: Abdomen is soft. There is no pulsatile mass.     Tenderness: There is no abdominal tenderness.  Musculoskeletal:     Right lower leg: No edema.     Left lower leg: No edema.  Skin:    General: Skin is warm and dry.  Neurological:     Mental Status: She is alert and oriented to person, place, and time.  Psychiatric:        Mood and Affect: Mood normal.        Behavior: Behavior normal.        Assessment & Plan:  Sabrina Knight is a 72 y.o. female . Type 2 diabetes mellitus with microalbuminuria, without long-term current use of insulin (HCC) - Plan: Comprehensive metabolic panel, Lipid panel, Microalbumin / creatinine urine ratio, Hemoglobin A1c  HFrEF (heart failure with reduced ejection fraction) (HCC) Stable, tolerating current regimen. Labs pending as above.  Some elevated postprandials, consider additional medication but continue Jardiance for now. appears euvolemic, recent cardiology note as above.  No med changes today. Recheck 6 months  No orders of the defined types were placed in this encounter.  Patient Instructions  Please schedule appointment with new eye doctor, and have them send me a  report for diabetic retinopathy screening.   No diabetes med changes unless numbers elevated. Keep up the good work with exercise, and diet. Take care!     Signed,   Meredith Staggers, MD [facility], [facility] 08/29/22 9:02 AM

## 2022-08-29 NOTE — Patient Instructions (Addendum)
Please schedule appointment with new eye doctor, and have them send me a report for diabetic retinopathy screening.   No diabetes med changes unless numbers elevated. Keep up the good work with exercise, and diet. Take care!

## 2022-09-10 ENCOUNTER — Telehealth: Payer: Self-pay

## 2022-09-10 NOTE — Telephone Encounter (Signed)
-----   Message from Shade Flood sent at 09/09/2022 11:19 PM EDT ----- Results sent by MyChart, but appears patient has not yet reviewed those results.  Please call and make sure they have either seen note or discuss result note.  Thanks.

## 2022-11-06 ENCOUNTER — Ambulatory Visit (INDEPENDENT_AMBULATORY_CARE_PROVIDER_SITE_OTHER): Payer: Medicare HMO | Admitting: *Deleted

## 2022-11-06 DIAGNOSIS — Z0000 Encounter for general adult medical examination without abnormal findings: Secondary | ICD-10-CM | POA: Diagnosis not present

## 2022-11-06 NOTE — Patient Instructions (Signed)
[name] , Thank you for taking time to come for your Medicare Wellness Visit. I appreciate your ongoing commitment to your health goals. Please review the following plan we discussed and let me know if I can assist you in the future.   Screening recommendations/referrals: Colonoscopy: Education provided Mammogram: Education provided Bone Density: up to date Recommended yearly ophthalmology/optometry visit for glaucoma screening and checkup Recommended yearly dental visit for hygiene and checkup  Vaccinations: Influenza vaccine: Education provided Pneumococcal vaccine: up tod ate Tdap vaccine: up to date Shingles vaccine: Education provided    Advanced directives: Education provided    Preventive Care 65 Years and Older, Female Preventive care refers to lifestyle choices and visits with your health care provider that can promote health and wellness. What does preventive care include? A yearly physical exam. This is also called an annual well check. Dental exams once or twice a year. Routine eye exams. Ask your health care provider how often you should have your eyes checked. Personal lifestyle choices, including: Daily care of your teeth and gums. Regular physical activity. Eating a healthy diet. Avoiding tobacco and drug use. Limiting alcohol use. Practicing safe sex. Taking low-dose aspirin every day. Taking vitamin and mineral supplements as recommended by your health care provider. What happens during an annual well check? The services and screenings done by your health care provider during your annual well check will depend on your age, overall health, lifestyle risk factors, and family history of disease. Counseling  Your health care provider may ask you questions about your: Alcohol use. Tobacco use. Drug use. Emotional well-being. Home and relationship well-being. Sexual activity. Eating habits. History of falls. Memory and ability to understand  (cognition). Work and work Astronomer. Reproductive health. Screening  You may have the following tests or measurements: Height, weight, and BMI. Blood pressure. Lipid and cholesterol levels. These may be checked every 5 years, or more frequently if you are over 71 years old. Skin check. Lung cancer screening. You may have this screening every year starting at age 31 if you have a 30-pack-year history of smoking and currently smoke or have quit within the past 15 years. Fecal occult blood test (FOBT) of the stool. You may have this test every year starting at age 54. Flexible sigmoidoscopy or colonoscopy. You may have a sigmoidoscopy every 5 years or a colonoscopy every 10 years starting at age 22. Hepatitis C blood test. Hepatitis B blood test. Sexually transmitted disease (STD) testing. Diabetes screening. This is done by checking your blood sugar (glucose) after you have not eaten for a while (fasting). You may have this done every 1-3 years. Bone density scan. This is done to screen for osteoporosis. You may have this done starting at age 36. Mammogram. This may be done every 1-2 years. Talk to your health care provider about how often you should have regular mammograms. Talk with your health care provider about your test results, treatment options, and if necessary, the need for more tests. Vaccines  Your health care provider may recommend certain vaccines, such as: Influenza vaccine. This is recommended every year. Tetanus, diphtheria, and acellular pertussis (Tdap, Td) vaccine. You may need a Td booster every 10 years. Zoster vaccine. You may need this after age 20. Pneumococcal 13-valent conjugate (PCV13) vaccine. One dose is recommended after age 50. Pneumococcal polysaccharide (PPSV23) vaccine. One dose is recommended after age 55. Talk to your health care provider about which screenings and vaccines you need and how often you need them.  This information is not intended to  replace advice given to you by your health care provider. Make sure you discuss any questions you have with your health care provider. Document Released: 02/03/2015 Document Revised: 09/27/2015 Document Reviewed: 11/08/2014 Elsevier Interactive Patient Education  2017 [employer].  Fall Prevention in the Home Falls can cause injuries. They can happen to people of all ages. There are many things you can do to make your home safe and to help prevent falls. What can I do on the outside of my home? Regularly fix the edges of walkways and driveways and fix any cracks. Remove anything that might make you trip as you walk through a door, such as a raised step or threshold. Trim any bushes or trees on the path to your home. Use bright outdoor lighting. Clear any walking paths of anything that might make someone trip, such as rocks or tools. Regularly check to see if handrails are loose or broken. Make sure that both sides of any steps have handrails. Any raised decks and porches should have guardrails on the edges. Have any leaves, snow, or ice cleared regularly. Use sand or salt on walking paths during winter. Clean up any spills in your garage right away. This includes oil or grease spills. What can I do in the bathroom? Use night lights. Install grab bars by the toilet and in the tub and shower. Do not use towel bars as grab bars. Use non-skid mats or decals in the tub or shower. If you need to sit down in the shower, use a plastic, non-slip stool. Keep the floor dry. Clean up any water that spills on the floor as soon as it happens. Remove soap buildup in the tub or shower regularly. Attach bath mats securely with double-sided non-slip rug tape. Do not have throw rugs and other things on the floor that can make you trip. What can I do in the bedroom? Use night lights. Make sure that you have a light by your bed that is easy to reach. Do not use any sheets or blankets that are too big for  your bed. They should not hang down onto the floor. Have a firm chair that has side arms. You can use this for support while you get dressed. Do not have throw rugs and other things on the floor that can make you trip. What can I do in the kitchen? Clean up any spills right away. Avoid walking on wet floors. Keep items that you use a lot in easy-to-reach places. If you need to reach something above you, use a strong step stool that has a grab bar. Keep electrical cords out of the way. Do not use floor polish or wax that makes floors slippery. If you must use wax, use non-skid floor wax. Do not have throw rugs and other things on the floor that can make you trip. What can I do with my stairs? Do not leave any items on the stairs. Make sure that there are handrails on both sides of the stairs and use them. Fix handrails that are broken or loose. Make sure that handrails are as long as the stairways. Check any carpeting to make sure that it is firmly attached to the stairs. Fix any carpet that is loose or worn. Avoid having throw rugs at the top or bottom of the stairs. If you do have throw rugs, attach them to the floor with carpet tape. Make sure that you have a light switch at the  top of the stairs and the bottom of the stairs. If you do not have them, ask someone to add them for you. What else can I do to help prevent falls? Wear shoes that: Do not have high heels. Have rubber bottoms. Are comfortable and fit you well. Are closed at the toe. Do not wear sandals. If you use a stepladder: Make sure that it is fully opened. Do not climb a closed stepladder. Make sure that both sides of the stepladder are locked into place. Ask someone to hold it for you, if possible. Clearly mark and make sure that you can see: Any grab bars or handrails. First and last steps. Where the edge of each step is. Use tools that help you move around (mobility aids) if they are needed. These  include: Canes. Walkers. Scooters. Crutches. Turn on the lights when you go into a dark area. Replace any light bulbs as soon as they burn out. Set up your furniture so you have a clear path. Avoid moving your furniture around. If any of your floors are uneven, fix them. If there are any pets around you, be aware of where they are. Review your medicines with your doctor. Some medicines can make you feel dizzy. This can increase your chance of falling. Ask your doctor what other things that you can do to help prevent falls. This information is not intended to replace advice given to you by your health care provider. Make sure you discuss any questions you have with your health care provider. Document Released: 11/03/2008 Document Revised: 06/15/2015 Document Reviewed: 02/11/2014 Elsevier Interactive Patient Education  2017 [employer].

## 2022-11-06 NOTE — Progress Notes (Signed)
Subjective:   Sabrina Knight is a 72 y.o. female who presents for Medicare Annual (Subsequent) preventive examination.  Visit Complete: Virtual I connected with  Sabrina Knight on 11/06/22 by a audio enabled telemedicine application and verified that I am speaking with the correct person using two identifiers.  Patient Location: Home  Provider Location: Home Office  I discussed the limitations of evaluation and management by telemedicine. The patient expressed understanding and agreed to proceed.  Vital Signs: Because this visit was a virtual/telehealth visit, some criteria may be missing or patient reported. Any vitals not documented were not able to be obtained and vitals that have been documented are patient reported. .      Objective:    There were no vitals filed for this visit. There is no height or weight on file to calculate BMI.     11/06/2022   11:03 AM 07/19/2021   12:20 PM 04/26/2021    8:24 AM 10/02/2018    9:55 AM 09/25/2018    9:44 AM 09/11/2018    1:06 PM 09/07/2018   12:59 PM  Advanced Directives  Does Patient Have a Medical Advance Directive? No Yes No No No No No  Type of Furniture conservator/restorer;Living will       Does patient want to make changes to medical advance directive?  No - Patient declined       Copy of Healthcare Power of Attorney in Chart?  No - copy requested       Would patient like information on creating a medical advance directive? No - Patient declined  No - Patient declined No - Patient declined No - Patient declined No - Patient declined No - Patient declined    Current Medications (verified) Outpatient Encounter Medications as of 11/06/2022  Medication Sig   allopurinol (ZYLOPRIM) 100 MG tablet TAKE 1 TABLET BY MOUTH TWICE A DAY   aspirin EC 81 MG tablet Take 81 mg by mouth daily. Swallow whole.   blood glucose meter kit and supplies Dispense based on patient and insurance preference. Use once per day.   Blood  Glucose Monitoring Suppl (ONE TOUCH ULTRA 2) w/Device KIT CHECK EITHER FASTING OR 2 HOURS AFTER MEALS.   budesonide (PULMICORT) 0.5 MG/2ML nebulizer solution Take by nebulization as needed (shortness of breath).   cetirizine (ZYRTEC) 10 MG tablet Take 10 mg by mouth daily.   co-enzyme Q-10 30 MG capsule Take 30 mg by mouth daily.   empagliflozin (JARDIANCE) 10 MG TABS tablet Take 1 tablet (10 mg total) by mouth daily before breakfast.   Glycerin-Polysorbate 80 (REFRESH DRY EYE THERAPY OP) Place 1 drop into both eyes daily as needed (Dry eyes).   Krill Oil 300 MG CAPS Take 300 mg by mouth daily.   Lancets (ONETOUCH DELICA PLUS LANCET33G) MISC 1 EACH BY DOES NOT APPLY ROUTE DAILY.   magnesium gluconate (MAGONATE) 500 MG tablet Take 500 mg by mouth at bedtime.   metoprolol succinate (TOPROL-XL) 25 MG 24 hr tablet TAKE 1 TABLET (25 MG TOTAL) BY MOUTH DAILY.   montelukast (SINGULAIR) 10 MG tablet Take 1 tablet (10 mg total) by mouth daily. (Patient taking differently: Take 10 mg by mouth at bedtime.)   Multiple Vitamin (MULTIVITAMIN WITH MINERALS) TABS tablet Take 1 tablet by mouth daily. Woman   ONETOUCH ULTRA test strip USE 1 STRIP DAILY AS DIRECTED E11.29   rosuvastatin (CRESTOR) 10 MG tablet TAKE 1 TABLET BY MOUTH DAILY IN THE EVENING 5  DAYS PER WEEK   sacubitril-valsartan (ENTRESTO) 24-26 MG TAKE 1 TABLET BY MOUTH TWICE A DAY   SYMBICORT 80-4.5 MCG/ACT inhaler Inhale 2 puffs into the lungs as needed (Shortness of breath).   Tart Cherry 1200 MG CAPS Take 1,200 mg by mouth at bedtime.   VITAMIN D, CHOLECALCIFEROL, PO Take 500 mg by mouth daily.   vitamin E 180 MG (400 UNITS) capsule Take 400 Units by mouth daily.   No facility-administered encounter medications on file as of 11/06/2022.    Allergies (verified) Lisinopril   History: Past Medical History:  Diagnosis Date   Allergy    Zyrtec, Singulair; allergy testing negative; [facility].   Arthritis    Asthma due to seasonal allergies     Cataract    Clotting disorder (HCC)    Glaucoma    Gout    ITP (idiopathic thrombocytopenic purpura)    Past Surgical History:  Procedure Laterality Date   ABDOMINAL HYSTERECTOMY  01/22/1999   ovaries resected; DUB/fibroids   CARPAL TUNNEL RELEASE Right 09/11/2018   Procedure: CARPAL TUNNEL RELEASE;  Surgeon: Cindee Salt, MD;  Location: [facility];  Service: Orthopedics;  Laterality: Right;   CARPAL TUNNEL RELEASE Left 10/02/2018   Procedure: CARPAL TUNNEL RELEASE;  Surgeon: Cindee Salt, MD;  Location: [facility];  Service: Orthopedics;  Laterality: Left;  FAB   EYE SURGERY N/A    [employer] 06/26/2019   LEFT HEART CATH AND CORONARY ANGIOGRAPHY N/A 07/19/2021   Procedure: LEFT HEART CATH AND CORONARY ANGIOGRAPHY;  Surgeon: Kathleene Hazel, MD;  Location: [facility];  Service: Cardiovascular;  Laterality: N/A;   spleen removal  01/22/1995   for ITP   spleen removal     Family History  Problem Relation Age of Onset   Cancer Mother 13       lung cancer   Hypertension Mother    Hyperlipidemia Mother    Mental illness Mother    Hypertension Sister    Hypertension Brother    Diabetes Brother    Cancer Brother 35       bladder cancer; non-smoker   Heart disease Brother 26       CABG/CAD   Hypertension Sister    Diabetes Sister    Hypertension Brother    Diabetes Brother    Hypertension Brother    Diabetes Brother    Hypertension Brother    COPD Father    Social History   Socioeconomic History   Marital status: Widowed    Spouse name: Not on file   Number of children: 0   Years of education: Not on file   Highest education level: Not on file  Occupational History   Occupation: Retired  Tobacco Use   Smoking status: Former    Current packs/day: 0.00    Types: Cigarettes    Quit date: 12/04/2003    Years since quitting: 18.9   Smokeless tobacco: Never  Vaping Use   Vaping status: Never Used  Substance and Sexual Activity    Alcohol use: No    Alcohol/week: 0.0 standard drinks of alcohol   Drug use: No   Sexual activity: Not Currently    Birth control/protection: Surgical  Other Topics Concern   Not on file  Social History Narrative   Marital status:  Widowed x since 2001 from melanoma; married x 27 years.  Not dating really in 2019.      Children:  None; failed in vitro.  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      Tobacco: quit ; smoked 20 years.      Alcohol: none      Education: [employer].       Exercise: Yes; 3 times per week 2 hours deep water aerobics.      ADLs: independent ADLs; no assistant devices.      Advanced Directives:  None; FULL CODE; no prolonged measures.    HCPOA: Darlene Jackson/sister-in-law.     Social Determinants of Health   Financial Resource Strain: Low Risk  (11/06/2022)   Overall Financial Resource Strain (CARDIA)    Difficulty of Paying Living Expenses: Not hard at all  Food Insecurity: No Food Insecurity (11/06/2022)   Hunger Vital Sign    Worried About Running Out of Food in the Last Year: Never true    Ran Out of Food in the Last Year: Never true  Transportation Needs: No Transportation Needs (11/06/2022)   PRAPARE - Administrator, Civil Service (Medical): No    Lack of Transportation (Non-Medical): No  Physical Activity: Sufficiently Active (11/06/2022)   Exercise Vital Sign    Days of Exercise per Week: 4 days    Minutes of Exercise per Session: 50 min  Stress: No Stress Concern Present (11/06/2022)   [employer] of Occupational Health - Occupational Stress Questionnaire    Feeling of Stress : Not at all  Social Connections: Socially Integrated (11/06/2022)   Social Connection and Isolation Panel [NHANES]    Frequency of Communication with Friends and Family: More than three times a week    Frequency of Social Gatherings with Friends and Family: Three times a week    Attends Religious Services:  More than 4 times per year    Active Member of Clubs or Organizations: Yes    Attends Engineer, structural: More than 4 times per year    Marital Status: Married    Tobacco Counseling Counseling given: Not Answered   Clinical Intake:  Pre-visit preparation completed: Yes  Pain : No/denies pain     Diabetes: No  How often do you need to have someone help you when you read instructions, pamphlets, or other written materials from your doctor or pharmacy?: 1 - Never  Interpreter Needed?: No  Information entered by :: Remi Haggard LPN   Activities of Daily Living    11/06/2022   11:11 AM  In your present state of health, do you have any difficulty performing the following activities:  Hearing? 0  Vision? 0  Difficulty concentrating or making decisions? 0  Walking or climbing stairs? 0  Dressing or bathing? 0  Doing errands, shopping? 0  Preparing Food and eating ? N  Using the Toilet? N  In the past six months, have you accidently leaked urine? Y  Do you have problems with loss of bowel control? N  Managing your Medications? N  Managing your Finances? N    Patient Care Team: Shade Flood, MD as PCP - General (Family Medicine) Jake Bathe, MD as PCP - Cardiology (Cardiology) Eileen Stanford, MD as Referring Physician (Allergy and Immunology) Mateo Flow, MD as Consulting Physician (Ophthalmology)  Indicate any recent Medical Services you may have received from other than [facility] providers in the past year (date may be approximate).     Assessment:   This is a routine wellness examination for Sarha.  Hearing/Vision screen Hearing Screening - Comments::  No trouble hearing Vision Screening - Comments:: Not up to date Cataract removal does not wear glasses   Goals Addressed             This Visit's Progress    Patient Stated       Continue current lifestyle       Depression Screen    11/06/2022   11:13 AM 08/29/2022    8:12 AM 02/28/2022     8:44 AM 10/26/2021    9:13 AM 10/03/2021    9:45 AM 08/15/2021    8:16 AM 07/12/2021    3:41 PM  PHQ 2/9 Scores  PHQ - 2 Score 0 0 0 0 0 0 1  PHQ- 9 Score 0 0  0 0  5    Fall Risk    11/06/2022   11:03 AM 08/29/2022    8:12 AM 02/28/2022    8:44 AM 10/26/2021    9:13 AM 10/03/2021    9:45 AM  Fall Risk   Falls in the past year? 0 0 0 0 0  Number falls in past yr: 0 0 0 0 0  Injury with Fall? 0 0 0 0 0  Risk for fall due to :  No Fall Risks No Fall Risks No Fall Risks No Fall Risks  Follow up Falls evaluation completed;Education provided;Falls prevention discussed Falls evaluation completed Falls evaluation completed Falls evaluation completed Falls evaluation completed    MEDICARE RISK AT HOME: Medicare Risk at Home Any stairs in or around the home?: No If so, are there any without handrails?: No Home free of loose throw rugs in walkways, pet beds, electrical cords, etc?: Yes Adequate lighting in your home to reduce risk of falls?: Yes Life alert?: No Use of a cane, walker or w/c?: No Grab bars in the bathroom?: Yes Shower chair or bench in shower?: No Elevated toilet seat or a handicapped toilet?: No  TIMED UP AND GO:  Was the test performed?  No    Cognitive Function:        11/06/2022   11:14 AM 06/29/2019   11:22 AM  6CIT Screen  What Year? 0 points 0 points  What month? 0 points 0 points  What time? 0 points 0 points  Count back from 20 0 points 0 points  Months in reverse 0 points 0 points  Repeat phrase 0 points 0 points  Total Score 0 points 0 points    Immunizations Immunization History  Administered Date(s) Administered   Fluad Quad(high Dose 65+) 10/03/2021   Influenza, High Dose Seasonal PF 10/15/2017, 10/29/2018, 10/29/2018, 12/01/2018, 12/01/2019   Influenza,inj,Quad PF,6+ Mos 10/08/2016   Influenza-Unspecified 11/29/2019, 10/31/2020   Moderna Covid-19 Fall Seasonal Vaccine 90yrs & older 10/26/2021   PFIZER(Purple Top)SARS-COV-2 Vaccination  02/23/2019, 03/30/2019, 11/29/2019, 05/10/2020, 10/31/2020   Pfizer Covid Bivalent Pediatric Vaccine(85mos to <47yrs) 10/26/2021   Pfizer Covid-19 Vaccine Bivalent Booster 12yrs & up 10/31/2020   Pneumococcal Conjugate-13 10/08/2016   Pneumococcal Polysaccharide-23 10/20/2017, 12/01/2019   RSV,unspecified 10/26/2021   Respiratory Syncytial Virus Vaccine,Recomb Aduvanted(Arexvy) 10/26/2021   Td 10/08/2016   Tdap 06/09/2020   Zoster, Live 06/29/2010   Zoster, Unspecified 08/06/2017    TDAP status: Up to date  Flu Vaccine status: Due, Education has been provided regarding the importance of this vaccine. Advised may receive this vaccine at local pharmacy or Health Dept. Aware to provide a copy of the vaccination record if obtained from local pharmacy or Health Dept. Verbalized acceptance and understanding.  Pneumococcal vaccine status: Up  to date  Covid-19 vaccine status: Information provided on how to obtain vaccines.   Qualifies for Shingles Vaccine? Yes   Zostavax completed No   Shingrix Completed?: No.    Education has been provided regarding the importance of this vaccine. Patient has been advised to call insurance company to determine out of pocket expense if they have not yet received this vaccine. Advised may also receive vaccine at local pharmacy or Health Dept. Verbalized acceptance and understanding.  Screening Tests Health Maintenance  Topic Date Due   OPHTHALMOLOGY EXAM  06/15/2022   Diabetic kidney evaluation - Urine ACR  08/17/2022   Zoster Vaccines- Shingrix (1 of 2) 11/29/2022 (Originally 06/24/1969)   INFLUENZA VACCINE  04/21/2023 (Originally 08/22/2022)   MAMMOGRAM  11/13/2022   FOOT EXAM  03/01/2023   HEMOGLOBIN A1C  03/01/2023   Diabetic kidney evaluation - eGFR measurement  08/29/2023   Medicare Annual Wellness (AWV)  11/06/2023   Colonoscopy  02/27/2027   DTaP/Tdap/Td (3 - Td or Tdap) 06/10/2030   Pneumonia Vaccine 30+ Years old  Completed   DEXA SCAN  Completed    Hepatitis C Screening  Completed   HPV VACCINES  Aged Out   COVID-19 Vaccine  Discontinued    Health Maintenance  Health Maintenance Due  Topic Date Due   OPHTHALMOLOGY EXAM  06/15/2022   Diabetic kidney evaluation - Urine ACR  08/17/2022    Colorectal cancer screening: Type of screening: Colonoscopy. Completed 2019. Repeat every 10 years  Mammogram status: Completed scheduled 10-28. Repeat every year  Bone Density status: Completed 2023. Results reflect: Bone density results: OSTEOPENIA. Repeat every 2023 years.  Lung Cancer Screening: (Low Dose CT Chest recommended if Age 69-80 years, 20 pack-year currently smoking OR have quit w/in 15years.) does not qualify.   Lung Cancer Screening Referral:   Additional Screening:  Hepatitis C Screening: does not qualify; Completed 2017  Vision Screening: Recommended annual ophthalmology exams for early detection of glaucoma and other disorders of the eye. Is the patient up to date with their annual eye exam?  Yes  Who is the provider or what is the name of the office in which the patient attends annual eye exams? Given information to Optometrist If pt is not established with a provider, would they like to be referred to a provider to establish care? No .   Dental Screening: Recommended annual dental exams for proper oral hygiene   Community Resource Referral / Chronic Care Management: CRR required this visit?  No   CCM required this visit?  No     Plan:     I have personally reviewed and noted the following in the patient's chart:   Medical and social history Use of alcohol, tobacco or illicit drugs  Current medications and supplements including opioid prescriptions. Patient is not currently taking opioid prescriptions. Functional ability and status Nutritional status Physical activity Advanced directives List of other physicians Hospitalizations, surgeries, and ER visits in previous 12 months Vitals Screenings to  include cognitive, depression, and falls Referrals and appointments  In addition, I have reviewed and discussed with patient certain preventive protocols, quality metrics, and best practice recommendations. A written personalized care plan for preventive services as well as general preventive health recommendations were provided to patient.     Remi Haggard, LPN   16/10/9602   After Visit Summary: (MyChart) Due to this being a telephonic visit, the after visit summary with patients personalized plan was offered to patient via MyChart   Nurse Notes:

## 2022-11-13 ENCOUNTER — Encounter: Payer: Medicare HMO | Admitting: Pharmacist

## 2022-11-13 ENCOUNTER — Other Ambulatory Visit: Payer: Self-pay | Admitting: Family Medicine

## 2022-11-13 DIAGNOSIS — R809 Proteinuria, unspecified: Secondary | ICD-10-CM

## 2022-11-18 DIAGNOSIS — Z1231 Encounter for screening mammogram for malignant neoplasm of breast: Secondary | ICD-10-CM | POA: Diagnosis not present

## 2022-11-18 LAB — HM MAMMOGRAPHY

## 2022-11-19 ENCOUNTER — Encounter: Payer: Self-pay | Admitting: Family Medicine

## 2022-11-26 ENCOUNTER — Other Ambulatory Visit: Payer: Self-pay | Admitting: Family Medicine

## 2022-11-26 DIAGNOSIS — E1129 Type 2 diabetes mellitus with other diabetic kidney complication: Secondary | ICD-10-CM

## 2022-12-29 ENCOUNTER — Other Ambulatory Visit: Payer: Self-pay | Admitting: Family Medicine

## 2022-12-29 DIAGNOSIS — M109 Gout, unspecified: Secondary | ICD-10-CM

## 2023-01-16 NOTE — Telephone Encounter (Signed)
error 

## 2023-02-19 ENCOUNTER — Telehealth: Payer: Self-pay | Admitting: Cardiology

## 2023-02-19 MED ORDER — ENTRESTO 24-26 MG PO TABS: 1.0000 | ORAL_TABLET | Freq: Two times a day (BID) | ORAL | 0 refills | Status: DC

## 2023-02-19 NOTE — Telephone Encounter (Signed)
*  STAT* If patient is at the pharmacy, call can be transferred to refill team.   1. Which medications need to be refilled? (please list name of each medication and dose if known) sacubitril-valsartan (ENTRESTO) 24-26 MG   2. Which pharmacy/location (including street and city if local pharmacy) is medication to be sent to? [id number] - [place], [place] - [address]   3. Do they need a 30 day or 90 day supply? 90

## 2023-02-26 DIAGNOSIS — Z008 Encounter for other general examination: Secondary | ICD-10-CM | POA: Diagnosis not present

## 2023-03-06 ENCOUNTER — Encounter: Payer: Self-pay | Admitting: Family Medicine

## 2023-03-06 ENCOUNTER — Ambulatory Visit (INDEPENDENT_AMBULATORY_CARE_PROVIDER_SITE_OTHER)
Admission: RE | Admit: 2023-03-06 | Discharge: 2023-03-06 | Disposition: A | Discharge status: Home / Self Care | Payer: Medicare HMO | Source: Ambulatory Visit | Attending: Family Medicine | Admitting: Family Medicine

## 2023-03-06 ENCOUNTER — Ambulatory Visit: Payer: Medicare HMO | Admitting: Family Medicine

## 2023-03-06 ENCOUNTER — Other Ambulatory Visit: Payer: Self-pay | Admitting: Family Medicine

## 2023-03-06 VITALS — BP 134/70 | HR 76 | Temp 97.8°F | Ht 64.5 in | Wt 199.4 lb

## 2023-03-06 DIAGNOSIS — R0609 Other forms of dyspnea: Secondary | ICD-10-CM

## 2023-03-06 DIAGNOSIS — M1A0710 Idiopathic chronic gout, right ankle and foot, without tophus (tophi): Secondary | ICD-10-CM

## 2023-03-06 DIAGNOSIS — R809 Proteinuria, unspecified: Secondary | ICD-10-CM | POA: Diagnosis not present

## 2023-03-06 DIAGNOSIS — E785 Hyperlipidemia, unspecified: Secondary | ICD-10-CM

## 2023-03-06 DIAGNOSIS — E1129 Type 2 diabetes mellitus with other diabetic kidney complication: Secondary | ICD-10-CM

## 2023-03-06 DIAGNOSIS — J4530 Mild persistent asthma, uncomplicated: Secondary | ICD-10-CM

## 2023-03-06 DIAGNOSIS — I5020 Unspecified systolic (congestive) heart failure: Secondary | ICD-10-CM

## 2023-03-06 DIAGNOSIS — R059 Cough, unspecified: Secondary | ICD-10-CM | POA: Diagnosis not present

## 2023-03-06 DIAGNOSIS — R052 Subacute cough: Secondary | ICD-10-CM

## 2023-03-06 DIAGNOSIS — R0602 Shortness of breath: Secondary | ICD-10-CM | POA: Diagnosis not present

## 2023-03-06 LAB — BRAIN NATRIURETIC PEPTIDE: Pro B Natriuretic peptide (BNP): 18 pg/mL (ref 0.0–100.0)

## 2023-03-06 LAB — URIC ACID: Uric Acid, Serum: 4.2 mg/dL (ref 2.4–7.0)

## 2023-03-06 LAB — HEMOGLOBIN A1C: Hgb A1c MFr Bld: 7.3 % — ABNORMAL HIGH (ref 4.6–6.5)

## 2023-03-06 LAB — COMPREHENSIVE METABOLIC PANEL
ALT: 25 U/L (ref 0–35)
AST: 26 U/L (ref 0–37)
Albumin: 4.2 g/dL (ref 3.5–5.2)
Alkaline Phosphatase: 78 U/L (ref 39–117)
BUN: 17 mg/dL (ref 6–23)
CO2: 26 meq/L (ref 19–32)
Calcium: 9.2 mg/dL (ref 8.4–10.5)
Chloride: 105 meq/L (ref 96–112)
Creatinine, Ser: 0.81 mg/dL (ref 0.40–1.20)
GFR: 71.87 mL/min (ref >60.00)
Glucose, Bld: 109 mg/dL — ABNORMAL HIGH (ref 70–99)
Potassium: 3.8 meq/L (ref 3.5–5.1)
Sodium: 141 meq/L (ref 135–145)
Total Bilirubin: 0.4 mg/dL (ref 0.2–1.2)
Total Protein: 7.6 g/dL (ref 6.0–8.3)

## 2023-03-06 LAB — CBC
HCT: 46.2 % — ABNORMAL HIGH (ref 36.0–46.0)
Hemoglobin: 15.2 g/dL — ABNORMAL HIGH (ref 12.0–15.0)
MCHC: 33 g/dL (ref 30.0–36.0)
MCV: 94.7 fL (ref 78.0–100.0)
Platelets: 449 10*3/uL — ABNORMAL HIGH (ref 150.0–400.0)
RBC: 4.88 Mil/uL (ref 3.87–5.11)
RDW: 14.2 % (ref 11.5–15.5)
WBC: 10.8 10*3/uL — ABNORMAL HIGH (ref 4.0–10.5)

## 2023-03-06 LAB — MICROALBUMIN / CREATININE URINE RATIO
Creatinine,U: 34 mg/dL
Microalb Creat Ratio: 20.6 mg/g (ref 0.0–30.0)
Microalb, Ur: <0.7 mg/dL (ref 0.0–1.9)

## 2023-03-06 LAB — LIPID PANEL
Cholesterol: 169 mg/dL (ref 0–200)
HDL: 50.5 mg/dL (ref >39.00)
LDL Cholesterol: 78 mg/dL (ref 0–99)
NonHDL: 118.71
Total CHOL/HDL Ratio: 3
Triglycerides: 204 mg/dL — ABNORMAL HIGH (ref 0.0–149.0)
VLDL: 40.8 mg/dL — ABNORMAL HIGH (ref 0.0–40.0)

## 2023-03-06 MED ORDER — SYMBICORT 80-4.5 MCG/ACT IN AERO: 2.0000 | INHALATION_SPRAY | Freq: Two times a day (BID) | RESPIRATORY_TRACT | 5 refills | Status: DC

## 2023-03-06 MED ORDER — ROSUVASTATIN CALCIUM 10 MG PO TABS
10.0000 mg | ORAL_TABLET | Freq: Every day | ORAL | 1 refills | Status: DC
Start: 2023-03-06 — End: 2023-07-10

## 2023-03-06 NOTE — Telephone Encounter (Signed)
Pharmacy sent message of non coverage and requested alternative please advise

## 2023-03-06 NOTE — Patient Instructions (Signed)
For shortness of breath, restart Symbicort as that could be from asthma.  Let me know if that is too costly and we can look at alternatives or have you meet with pharmacist for medication assistance programs.  Please have x-ray at [facility] location below.  I am checking some test for heart failure as that can also cause shortness of breath but your lungs were clear today.  No other med changes for now.  I will check labs and we can review those labs and need for med changes at follow-up in the next 2 weeks.  If any worsening shortness of breath, worsening cough or other worsening symptoms in the meantime, please be seen sooner.  Take care!  [facility] or xray: Walk in 8:30-4:30 during weekdays, no appointment needed [address]  [place], [place] [address]

## 2023-03-06 NOTE — Progress Notes (Signed)
Subjective:  Patient ID: Sabrina Knight, female    DOB: Mar 06, 1950  Age: 73 y.o. MRN: [id number]  CC:  Chief Complaint  Patient presents with   Shortness of Breath    Notes has been experiencing sinus congestion, cough, and shortness of breath with exertion since the start of the new year and has not been able to get it to go away no other sxs    Medical Management of Chronic Issues    No questions on     HPI Sabrina Knight presents for   Cough, congestion, shortness of breath Started mid January. Head congestion, nasal congestion, no fever. Nasal congestion improved  in few weeks with benadryl at night, tylenol.  Some persistent  dry cough, shortness of breath with activity - intermittent. No recent fever or chest pain. No new arm or leg swelling, no PND, no orthopnea.   She does have a history of asthma as well as heart failure with reduced EF.  Cardiology note from April 2024, continued on [place] [place], [employer].  EF 40 to 45% on November 2023 echo. Symbicort 80/4.5 mg, budesonide 0.5 mg neb for asthma treatment.  Has not used symbicort recently or neb. No specific reason. Not in past 6 months.  No recent use of singulair.  No new chest pain, calf pain or swelling.  Wt Readings from Last 3 Encounters:  03/06/23 199 lb 6.4 oz (90.4 kg)  08/29/22 199 lb (90.3 kg)  05/07/22 200 lb 6.4 oz (90.9 kg)    Gout: Last flare:none recent  Daily meds:allopurinol 100mg  bid.  Prn med:  Lab Results  Component Value Date   LABURIC 5.5 12/29/2019     Diabetes: With microalbuminuria, chronic heart failure with reduced EF, and ICM. Started on Jardiance August 2023, she is on statin, ARB -on Entresto from cardiology.  Dietary adjustments for increasing A1c.  Last visit in August, A1c improved to 7.1. Home readings - 119 - checks every few weeks.  No lows. No 200's.  No side effects with meds.  Microalbumin: Normal ratio 08/16/2021.  Unable to provide urine last visit. Optho,  foot exam, pneumovax: New ophthalmology was planned at her last visit.  Lab Results  Component Value Date   HGBA1C 7.1 (H) 08/29/2022   HGBA1C 7.3 (H) 02/28/2022   HGBA1C 7.0 (H) 11/19/2021   Lab Results  Component Value Date   MICROALBUR 1.8 08/16/2021   LDLCALC 92 08/29/2022   CREATININE 0.85 08/29/2022      History Patient Active Problem List   Diagnosis Date Noted   Shortness of breath    Precordial chest pain 07/18/2021   PVC's (premature ventricular contractions) 07/18/2021   Degenerative disc disease, lumbar 07/12/2020   Degenerative arthritis of left knee 05/26/2020   Greater trochanteric bursitis of both hips 05/26/2020   Mild persistent asthma, uncomplicated 01/08/2019   Vasomotor rhinitis 01/08/2019   Other allergic rhinitis 01/08/2019   Bilateral carpal tunnel syndrome 09/25/2018   Dyslipidemia 10/22/2017   Statin intolerance 10/22/2017   Newly diagnosed diabetes (HCC) 10/22/2017   Gout 12/23/2016   Class 1 obesity due to excess calories with serious comorbidity and body mass index (BMI) of 31.0 to 31.9 in adult 05/19/2016   Osteopenia of necks of both femurs 05/19/2016   Thrombocytosis 02/08/2014   High cholesterol 02/01/2013   H/O splenectomy 02/01/2013   Non-allergic rhinitis 02/01/2013   Glaucoma 02/01/2013   Past Medical History:  Diagnosis Date   Allergy    Zyrtec, Singulair; allergy testing  negative; [facility].   Arthritis    Asthma due to seasonal allergies    Cataract    Clotting disorder (HCC)    Glaucoma    Gout    ITP (idiopathic thrombocytopenic purpura)    Past Surgical History:  Procedure Laterality Date   ABDOMINAL HYSTERECTOMY  01/22/1999   ovaries resected; DUB/fibroids   CARPAL TUNNEL RELEASE Right 09/11/2018   Procedure: CARPAL TUNNEL RELEASE;  Surgeon: Cindee Salt, MD;  Location: [facility];  Service: Orthopedics;  Laterality: Right;   CARPAL TUNNEL RELEASE Left 10/02/2018   Procedure: CARPAL TUNNEL RELEASE;   Surgeon: Cindee Salt, MD;  Location: [facility];  Service: Orthopedics;  Laterality: Left;  FAB   EYE SURGERY N/A    [employer] 06/26/2019   LEFT HEART CATH AND CORONARY ANGIOGRAPHY N/A 07/19/2021   Procedure: LEFT HEART CATH AND CORONARY ANGIOGRAPHY;  Surgeon: Kathleene Hazel, MD;  Location: [facility];  Service: Cardiovascular;  Laterality: N/A;   spleen removal  01/22/1995   for ITP   spleen removal     Allergies  Allergen Reactions   Lisinopril Cough   Prior to Admission medications   Medication Sig Start Date End Date Taking? Authorizing Provider  allopurinol (ZYLOPRIM) 100 MG tablet TAKE 1 TABLET BY MOUTH TWICE A DAY 12/30/22  Yes Shade Flood, MD  aspirin EC 81 MG tablet Take 81 mg by mouth daily. Swallow whole.   Yes [provider]  blood glucose meter kit and supplies Dispense based on patient and insurance preference. Use once per day. 10/26/21  Yes Shade Flood, MD  Blood Glucose Monitoring Suppl (ONE TOUCH ULTRA 2) w/Device KIT CHECK EITHER FASTING OR 2 HOURS AFTER MEALS. 04/05/22  Yes Shade Flood, MD  budesonide (PULMICORT) 0.5 MG/2ML nebulizer solution Take by nebulization as needed (shortness of breath). 04/09/21  Yes [provider]  cetirizine (ZYRTEC) 10 MG tablet Take 10 mg by mouth daily.   Yes [provider]  co-enzyme Q-10 30 MG capsule Take 30 mg by mouth daily.   Yes [provider]  empagliflozin (JARDIANCE) 10 MG TABS tablet Take 1 tablet (10 mg total) by mouth daily before breakfast. 08/13/22  Yes Jake Bathe, MD  Glycerin-Polysorbate 80 (REFRESH DRY EYE THERAPY OP) Place 1 drop into both eyes daily as needed (Dry eyes).   Yes [provider]  Boris Lown Oil 300 MG CAPS Take 300 mg by mouth daily.   Yes [provider]  Lancets [facility] DELICA PLUS LANCET33G) MISC USE AS DIRECTED 11/26/22  Yes Shade Flood, MD  magnesium gluconate (MAGONATE) 500 MG tablet Take 500 mg by  mouth at bedtime.   Yes [provider]  metoprolol succinate (TOPROL-XL) 25 MG 24 hr tablet TAKE 1 TABLET (25 MG TOTAL) BY MOUTH DAILY. 05/07/22  Yes Jake Bathe, MD  montelukast (SINGULAIR) 10 MG tablet Take 1 tablet (10 mg total) by mouth daily. Patient taking differently: Take 10 mg by mouth at bedtime. 02/07/21  Yes Shade Flood, MD  Multiple Vitamin (MULTIVITAMIN WITH MINERALS) TABS tablet Take 1 tablet by mouth daily. Woman   Yes [provider]  [facility] ULTRA test strip USE 1 STRIP DAILY AS DIRECTED 11/13/22  Yes Shade Flood, MD  rosuvastatin (CRESTOR) 10 MG tablet TAKE 1 TABLET BY MOUTH DAILY IN THE EVENING 5 DAYS PER WEEK 08/26/22  Yes Shade Flood, MD  sacubitril-valsartan (ENTRESTO) 24-26 MG Take 1 tablet by mouth 2 (  two) times daily. 02/19/23  Yes Jake Bathe, MD  SYMBICORT 80-4.5 MCG/ACT inhaler Inhale 2 puffs into the lungs as needed (Shortness of breath). 06/30/21  Yes [provider]  Tart Cherry 1200 MG CAPS Take 1,200 mg by mouth at bedtime.   Yes [provider]  VITAMIN D, CHOLECALCIFEROL, PO Take 500 mg by mouth daily.   Yes [provider]  vitamin E 180 MG (400 UNITS) capsule Take 400 Units by mouth daily.   Yes [provider]   Social History   Socioeconomic History   Marital status: Widowed    Spouse name: Not on file   Number of children: 0   Years of education: Not on file   Highest education level: Not on file  Occupational History   Occupation: Retired  Tobacco Use   Smoking status: Former    Current packs/day: 0.00    Types: Cigarettes    Quit date: 12/04/2003    Years since quitting: 19.2   Smokeless tobacco: Never  Vaping Use   Vaping status: Never Used  Substance and Sexual Activity   Alcohol use: No    Alcohol/week: 0.0 standard drinks of alcohol   Drug use: No   Sexual activity: Not Currently    Birth control/protection: Surgical  Other Topics Concern   Not on file   Social History Narrative   Marital status:  Widowed x since 2001 from melanoma; married x 27 years.  Not dating really in 2019.      Children:  None; failed in vitro.      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      Tobacco: quit ; smoked 20 years.      Alcohol: none      Education: [employer].       Exercise: Yes; 3 times per week 2 hours deep water aerobics.      ADLs: independent ADLs; no assistant devices.      Advanced Directives:  None; FULL CODE; no prolonged measures.    HCPOA: Darlene Jackson/sister-in-law.     Social Drivers of Corporate investment banker Strain: Low Risk  (11/06/2022)   Overall Financial Resource Strain (CARDIA)    Difficulty of Paying Living Expenses: Not hard at all  Food Insecurity: No Food Insecurity (11/06/2022)   Hunger Vital Sign    Worried About Running Out of Food in the Last Year: Never true    Ran Out of Food in the Last Year: Never true  Transportation Needs: No Transportation Needs (11/06/2022)   PRAPARE - Administrator, Civil Service (Medical): No    Lack of Transportation (Non-Medical): No  Physical Activity: Sufficiently Active (11/06/2022)   Exercise Vital Sign    Days of Exercise per Week: 4 days    Minutes of Exercise per Session: 50 min  Stress: No Stress Concern Present (11/06/2022)   [employer] of Occupational Health - Occupational Stress Questionnaire    Feeling of Stress : Not at all  Social Connections: Socially Integrated (11/06/2022)   Social Connection and Isolation Panel [NHANES]    Frequency of Communication with Friends and Family: More than three times a week    Frequency of Social Gatherings with Friends and Family: Three times a week    Attends Religious Services: More than 4 times per year    Active Member of Clubs or Organizations: Yes    Attends Banker Meetings: More  than 4 times per year    Marital Status: Married  Catering manager  Violence: Not At Risk (11/06/2022)   Humiliation, Afraid, Rape, and Kick questionnaire    Fear of Current or Ex-Partner: No    Emotionally Abused: No    Physically Abused: No    Sexually Abused: No    Review of Systems   Objective:   Vitals:   03/06/23 0849  BP: 134/70  Pulse: 76  Temp: 97.8 F (36.6 C)  TempSrc: Temporal  SpO2: 97%  Weight: 199 lb 6.4 oz (90.4 kg)  Height: 5' 4.5" (1.638 m)     Physical Exam Vitals reviewed.  Constitutional:      General: She is not in acute distress.    Appearance: Normal appearance. She is well-developed.  HENT:     Head: Normocephalic and atraumatic.     Right Ear: Hearing, tympanic membrane, ear canal and external ear normal.     Left Ear: Hearing, tympanic membrane, ear canal and external ear normal.     Nose: Nose normal.     Mouth/Throat:     Pharynx: No posterior oropharyngeal erythema.  Eyes:     Conjunctiva/sclera: Conjunctivae normal.     Pupils: Pupils are equal, round, and reactive to light.  Neck:     Vascular: No carotid bruit.  Cardiovascular:     Rate and Rhythm: Normal rate and regular rhythm.     Heart sounds: Normal heart sounds. No murmur heard. Pulmonary:     Effort: Pulmonary effort is normal. No respiratory distress.     Breath sounds: Normal breath sounds. No wheezing or rhonchi.  Abdominal:     Palpations: Abdomen is soft. There is no pulsatile mass.     Tenderness: There is no abdominal tenderness.  Musculoskeletal:     Right lower leg: No edema.     Left lower leg: No edema.  Skin:    General: Skin is warm and dry.     Findings: No rash.  Neurological:     Mental Status: She is alert and oriented to person, place, and time.  Psychiatric:        Mood and Affect: Mood normal.        Behavior: Behavior normal.        Assessment & Plan:  Sabrina Knight is a 73 y.o. female . Type 2 diabetes mellitus with microalbuminuria, without long-term current use of insulin (HCC) - Plan:  Comprehensive metabolic panel, Hemoglobin A1c, Microalbumin / creatinine urine ratio  -Check A1c, continue Jardiance same dose for now, adjust meds accordingly based on lab results, follow-up in 2 weeks to review labs and plan.  Hyperlipidemia LDL goal <100 - Plan: rosuvastatin (CRESTOR) 10 MG tablet  -Tolerating current regimen with Crestor, check labs and adjust plan accordingly. Hyperlipidemia, unspecified hyperlipidemia type - Plan: Comprehensive metabolic panel, Lipid panel  DOE (dyspnea on exertion) - Plan: Brain natriuretic peptide, DG Chest 2 View, CBC HFrEF (heart failure with reduced ejection fraction) (HCC) - Plan: Brain natriuretic peptide, DG Chest 2 View Mild persistent asthma, uncomplicated - Plan: SYMBICORT 80-4.5 MCG/ACT inhaler Subacute cough - Plan: SYMBICORT 80-4.5 MCG/ACT inhaler  -Likely postinfectious cough with probable viral illness last month but with persistent cough, shortness of breath and underlying asthma as well as CHF, will check BNP, chest x-ray, restart Symbicort.  Close follow-up in the next 2 weeks with RTC precautions if worsening sooner.  If Symbicort cost prohibitive can look at alternate options or assistance with pharmacist.  Chronic idiopathic gout involving toe of right foot without tophus - Plan: Uric acid  -Asymptomatic, no recent flares.  Check uric acid, consider low-dose allopurinol.  Meds ordered this encounter  Medications   rosuvastatin (CRESTOR) 10 MG tablet    Sig: Take 1 tablet (10 mg total) by mouth daily. TAKE 1 TABLET BY MOUTH DAILY IN THE EVENING 5 DAYS PER WEEK    Dispense:  90 tablet    Refill:  1   SYMBICORT 80-4.5 MCG/ACT inhaler    Sig: Inhale 2 puffs into the lungs in the morning and at bedtime.    Dispense:  1 each    Refill:  5   Patient Instructions  For shortness of breath, restart Symbicort as that could be from asthma.  Let me know if that is too costly and we can look at alternatives or have you meet with pharmacist  for medication assistance programs.  Please have x-ray at [facility] location below.  I am checking some test for heart failure as that can also cause shortness of breath but your lungs were clear today.  No other med changes for now.  I will check labs and we can review those labs and need for med changes at follow-up in the next 2 weeks.  If any worsening shortness of breath, worsening cough or other worsening symptoms in the meantime, please be seen sooner.  Take care!  [facility] or xray: Walk in 8:30-4:30 during weekdays, no appointment needed [address]  [place], [place] [address]      Signed,   Meredith Staggers, MD [facility], [facility] 03/06/23 9:24 AM

## 2023-03-07 ENCOUNTER — Encounter: Payer: Self-pay | Admitting: Family Medicine

## 2023-03-07 MED ORDER — FLUTICASONE-SALMETEROL 100-50 MCG/ACT IN AEPB: 1.0000 | INHALATION_SPRAY | Freq: Two times a day (BID) | RESPIRATORY_TRACT | 3 refills | Status: DC

## 2023-03-07 NOTE — Telephone Encounter (Signed)
Will change prescription to [facility] as that appears to be covered better than Symbicort.  Let me know if that is not covered and please advise patient on reason for change in plan from Symbicort to [facility].  Thanks

## 2023-03-12 ENCOUNTER — Telehealth: Payer: Self-pay

## 2023-03-12 NOTE — Telephone Encounter (Signed)
-----   Message from Shade Flood sent at 03/12/2023 11:58 AM EST ----- Results sent by MyChart, but appears patient has not yet reviewed those results.  Please call and make sure they have either seen note or discuss result note.  Thanks.

## 2023-03-12 NOTE — Telephone Encounter (Signed)
 Blood sugar mildly elevated at 109 but overall electrolytes looked okay. Cholesterol levels were stable.  Infection fighting cells borderline elevated, platelets elevated but similar to previous readings.  29-month blood sugar test slightly above goal of 7.  No change in meds for now.  Continue to watch diet and can recheck levels in 3 months.  Gout test looked okay, could potentially decrease the allopurinol to once per day.  Heart failure test looked okay.  Urine test for protein looked okay.  Let me know if there are questions.   Dr. Neva Seat

## 2023-03-12 NOTE — Telephone Encounter (Signed)
 IMAGING RESULTS: Good news, chest x-ray did not show any signs of infection or fluid.  Starting the inhaler for asthma symptoms may help cough. Let me know if there are questions.   Dr. Neva Seat

## 2023-03-13 NOTE — Telephone Encounter (Signed)
 Lab and X-ray results have been discussed.   Verbalized understanding? Yes  Are there any questions? No    Patient stated she started the inhaler and has notice a big difference already.

## 2023-03-20 ENCOUNTER — Ambulatory Visit: Payer: Medicare HMO | Admitting: Family Medicine

## 2023-04-09 ENCOUNTER — Encounter: Payer: Self-pay | Admitting: Family Medicine

## 2023-04-09 ENCOUNTER — Ambulatory Visit: Payer: Medicare HMO | Admitting: Family Medicine

## 2023-04-09 VITALS — BP 122/74 | HR 83 | Temp 97.9°F | Ht 64.0 in | Wt 200.4 lb

## 2023-04-09 DIAGNOSIS — J4530 Mild persistent asthma, uncomplicated: Secondary | ICD-10-CM

## 2023-04-09 DIAGNOSIS — R809 Proteinuria, unspecified: Secondary | ICD-10-CM | POA: Diagnosis not present

## 2023-04-09 DIAGNOSIS — E1129 Type 2 diabetes mellitus with other diabetic kidney complication: Secondary | ICD-10-CM | POA: Diagnosis not present

## 2023-04-09 DIAGNOSIS — Z7984 Long term (current) use of oral hypoglycemic drugs: Secondary | ICD-10-CM

## 2023-04-09 NOTE — Progress Notes (Signed)
 Subjective:  Patient ID: Sabrina Knight, female    DOB: 1950/08/23  Age: 73 y.o. MRN: [id number]  CC:  Chief Complaint  Patient presents with   Results    Pt here to discuss Xray notes she is feeling much better.     HPI Sabrina Knight presents for   Cough, congestion, dyspnea. Discussed February 13.  History of asthma as well as heart failure with reduced EF.  Treated with Sherryll Burger, Toprol, Jardiance for CHF.  Symbicort 80/4.5, budesonide 0.5 mg for asthma treatment but had not used Symbicort recently or nebs.  Had also been off her Singulair at her last visit.  Denied chest pain calf pain or swelling. Possible asthma flare versus postinfectious cough with probable viral illness previous months.  BNP, chest x-ray was ordered, Symbicort was restarted.  BNP normal at 18.  Chest x-ray without active cardiopulmonary disease.  Since last visit - cough has improved. On Wixela 100/5 - 1 puff per day. No nebs needed. Cough has improved. Year round allergies. Now back on zyrtec in am and singulair at night. Allergies better controlled. Manageable.   Diabetes: Meds discussed at last visit. Went to pharmacy, [place] was $450, still taking what is left. Not due for refill - not planning to refill d/t cost - will run out in a few weeks. [place] Pepper was not costly last year.   Lab Results  Component Value Date   HGBA1C 7.3 (H) 03/06/2023   HGBA1C 7.1 (H) 08/29/2022   HGBA1C 7.3 (H) 02/28/2022   Lab Results  Component Value Date   MICROALBUR <0.7 03/06/2023   LDLCALC 78 03/06/2023   CREATININE 0.81 03/06/2023         History Patient Active Problem List   Diagnosis Date Noted   Shortness of breath    Precordial chest pain 07/18/2021   PVC's (premature ventricular contractions) 07/18/2021   Degenerative disc disease, lumbar 07/12/2020   Degenerative arthritis of left knee 05/26/2020   Greater trochanteric bursitis of both hips 05/26/2020   Mild persistent asthma, uncomplicated  01/08/2019   Vasomotor rhinitis 01/08/2019   Other allergic rhinitis 01/08/2019   Bilateral carpal tunnel syndrome 09/25/2018   Dyslipidemia 10/22/2017   Statin intolerance 10/22/2017   Newly diagnosed diabetes (HCC) 10/22/2017   Gout 12/23/2016   Class 1 obesity due to excess calories with serious comorbidity and body mass index (BMI) of 31.0 to 31.9 in adult 05/19/2016   Osteopenia of necks of both femurs 05/19/2016   Thrombocytosis 02/08/2014   High cholesterol 02/01/2013   H/O splenectomy 02/01/2013   Non-allergic rhinitis 02/01/2013   Glaucoma 02/01/2013   Past Medical History:  Diagnosis Date   Allergy    Zyrtec, Singulair; allergy testing negative; [facility].   Arthritis    Asthma due to seasonal allergies    Cataract    Clotting disorder (HCC)    Glaucoma    Gout    ITP (idiopathic thrombocytopenic purpura)    Past Surgical History:  Procedure Laterality Date   ABDOMINAL HYSTERECTOMY  01/22/1999   ovaries resected; DUB/fibroids   CARPAL TUNNEL RELEASE Right 09/11/2018   Procedure: CARPAL TUNNEL RELEASE;  Surgeon: Cindee Salt, MD;  Location: [facility];  Service: Orthopedics;  Laterality: Right;   CARPAL TUNNEL RELEASE Left 10/02/2018   Procedure: CARPAL TUNNEL RELEASE;  Surgeon: Cindee Salt, MD;  Location: [facility];  Service: Orthopedics;  Laterality: Left;  FAB   EYE SURGERY N/A    [employer] 06/26/2019   LEFT  HEART CATH AND CORONARY ANGIOGRAPHY N/A 07/19/2021   Procedure: LEFT HEART CATH AND CORONARY ANGIOGRAPHY;  Surgeon: Kathleene Hazel, MD;  Location: [facility];  Service: Cardiovascular;  Laterality: N/A;   spleen removal  01/22/1995   for ITP   spleen removal     Allergies  Allergen Reactions   Lisinopril Cough   Prior to Admission medications   Medication Sig Start Date End Date Taking? Authorizing Provider  allopurinol (ZYLOPRIM) 100 MG tablet TAKE 1 TABLET BY MOUTH TWICE A DAY 12/30/22  Yes Shade Flood, MD   aspirin EC 81 MG tablet Take 81 mg by mouth daily. Swallow whole.   Yes [provider]  blood glucose meter kit and supplies Dispense based on patient and insurance preference. Use once per day. 10/26/21  Yes Shade Flood, MD  Blood Glucose Monitoring Suppl (ONE TOUCH ULTRA 2) w/Device KIT CHECK EITHER FASTING OR 2 HOURS AFTER MEALS. 04/05/22  Yes Shade Flood, MD  budesonide (PULMICORT) 0.5 MG/2ML nebulizer solution Take by nebulization as needed (shortness of breath). 04/09/21  Yes [provider]  cetirizine (ZYRTEC) 10 MG tablet Take 10 mg by mouth daily.   Yes [provider]  co-enzyme Q-10 30 MG capsule Take 30 mg by mouth daily.   Yes [provider]  empagliflozin (JARDIANCE) 10 MG TABS tablet Take 1 tablet (10 mg total) by mouth daily before breakfast. 08/13/22  Yes Skains, Veverly Fells, MD  fluticasone-salmeterol (WIXELA INHUB) 100-50 MCG/ACT AEPB Inhale 1 puff into the lungs 2 (two) times daily. 03/07/23  Yes Shade Flood, MD  Glycerin-Polysorbate 80 (REFRESH DRY EYE THERAPY OP) Place 1 drop into both eyes daily as needed (Dry eyes).   Yes [provider]  Boris Lown Oil 300 MG CAPS Take 300 mg by mouth daily.   Yes [provider]  Lancets [facility] DELICA PLUS LANCET33G) MISC USE AS DIRECTED 11/26/22  Yes Shade Flood, MD  magnesium gluconate (MAGONATE) 500 MG tablet Take 500 mg by mouth at bedtime.   Yes [provider]  metoprolol succinate (TOPROL-XL) 25 MG 24 hr tablet TAKE 1 TABLET (25 MG TOTAL) BY MOUTH DAILY. 05/07/22  Yes Jake Bathe, MD  montelukast (SINGULAIR) 10 MG tablet Take 1 tablet (10 mg total) by mouth daily. Patient taking differently: Take 10 mg by mouth at bedtime. 02/07/21  Yes Shade Flood, MD  Multiple Vitamin (MULTIVITAMIN WITH MINERALS) TABS tablet Take 1 tablet by mouth daily. Woman   Yes [provider]  [facility] ULTRA test strip USE 1 STRIP DAILY AS DIRECTED 11/13/22  Yes  Shade Flood, MD  rosuvastatin (CRESTOR) 10 MG tablet Take 1 tablet (10 mg total) by mouth daily. TAKE 1 TABLET BY MOUTH DAILY IN THE EVENING 5 DAYS PER WEEK 03/06/23  Yes Shade Flood, MD  sacubitril-valsartan (ENTRESTO) 24-26 MG Take 1 tablet by mouth 2 (two) times daily. 02/19/23  Yes Jake Bathe, MD  Tart Cherry 1200 MG CAPS Take 1,200 mg by mouth at bedtime.   Yes [provider]  VITAMIN D, CHOLECALCIFEROL, PO Take 500 mg by mouth daily.   Yes [provider]  vitamin E 180 MG (400 UNITS) capsule Take 400 Units by mouth daily.   Yes [provider]   Social History   Socioeconomic History   Marital status: Widowed    Spouse name: Not on file   Number of children: 0   Years of education: Not on file  Highest education level: Not on file  Occupational History   Occupation: Retired  Tobacco Use   Smoking status: Former    Current packs/day: 0.00    Types: Cigarettes    Quit date: 12/04/2003    Years since quitting: 19.3   Smokeless tobacco: Never  Vaping Use   Vaping status: Never Used  Substance and Sexual Activity   Alcohol use: No    Alcohol/week: 0.0 standard drinks of alcohol   Drug use: No   Sexual activity: Not Currently    Birth control/protection: Surgical  Other Topics Concern   Not on file  Social History Narrative   Marital status:  Widowed x since 2001 from melanoma; married x 27 years.  Not dating really in 2019.      Children:  None; failed in vitro.      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      Tobacco: quit ; smoked 20 years.      Alcohol: none      Education: [employer].       Exercise: Yes; 3 times per week 2 hours deep water aerobics.      ADLs: independent ADLs; no assistant devices.      Advanced Directives:  None; FULL CODE; no prolonged measures.    HCPOA: Darlene Jackson/sister-in-law.     Social Drivers of Corporate investment banker Strain: Low Risk   (11/06/2022)   Overall Financial Resource Strain (CARDIA)    Difficulty of Paying Living Expenses: Not hard at all  Food Insecurity: No Food Insecurity (11/06/2022)   Hunger Vital Sign    Worried About Running Out of Food in the Last Year: Never true    Ran Out of Food in the Last Year: Never true  Transportation Needs: No Transportation Needs (11/06/2022)   PRAPARE - Administrator, Civil Service (Medical): No    Lack of Transportation (Non-Medical): No  Physical Activity: Sufficiently Active (11/06/2022)   Exercise Vital Sign    Days of Exercise per Week: 4 days    Minutes of Exercise per Session: 50 min  Stress: No Stress Concern Present (11/06/2022)   [employer] of Occupational Health - Occupational Stress Questionnaire    Feeling of Stress : Not at all  Social Connections: Socially Integrated (11/06/2022)   Social Connection and Isolation Panel [NHANES]    Frequency of Communication with Friends and Family: More than three times a week    Frequency of Social Gatherings with Friends and Family: Three times a week    Attends Religious Services: More than 4 times per year    Active Member of Clubs or Organizations: Yes    Attends Banker Meetings: More than 4 times per year    Marital Status: Married  Catering manager Violence: Not At Risk (11/06/2022)   Humiliation, Afraid, Rape, and Kick questionnaire    Fear of Current or Ex-Partner: No    Emotionally Abused: No    Physically Abused: No    Sexually Abused: No    Review of Systems   Objective:   Vitals:   04/09/23 1016  BP: 122/74  Pulse: 83  Temp: 97.9 F (36.6 C)  TempSrc: Temporal  SpO2: 95%  Weight: 200 lb 6.4 oz (90.9 kg)  Height: 5\' 4"  (1.626 m)     Physical Exam Vitals reviewed.  Constitutional:      Appearance: Normal appearance. She is well-developed.  HENT:  Head: Normocephalic and atraumatic.  Eyes:     Conjunctiva/sclera: Conjunctivae normal.     Pupils:  Pupils are equal, round, and reactive to light.  Neck:     Vascular: No carotid bruit.  Cardiovascular:     Rate and Rhythm: Normal rate and regular rhythm.     Heart sounds: Normal heart sounds.  Pulmonary:     Effort: Pulmonary effort is normal. No respiratory distress.     Breath sounds: Normal breath sounds. No stridor. No wheezing, rhonchi or rales.  Abdominal:     Palpations: Abdomen is soft. There is no pulsatile mass.     Tenderness: There is no abdominal tenderness.  Musculoskeletal:     Right lower leg: No edema.     Left lower leg: No edema.  Skin:    General: Skin is warm and dry.  Neurological:     Mental Status: She is alert and oriented to person, place, and time.  Psychiatric:        Mood and Affect: Mood normal.        Behavior: Behavior normal.        Assessment & Plan:  Sabrina Knight is a 73 y.o. female . Type 2 diabetes mellitus with microalbuminuria, without long-term current use of insulin (HCC) - Plan: AMB Referral VBCI Care Management  Mild persistent asthma, uncomplicated  Cough, congestion improved.  Clear on exam, reassuring x-rays and labs previously -borderline white blood cell count but elevated previously.  Afebrile.  Clinically, subjectively improved.  Likely allergy/asthma flare with prior illness and postinfectious cough which is now improved.  Continue Wixela, other allergy treatment as above.  RTC precautions.  Jardiance now cost prohibitive.  Will refer back to pharmacist to look into medication assistance options or alternatives that may be more cost effective.  No orders of the defined types were placed in this encounter.  Patient Instructions  Glad you are better. I suspect cough was related to the previous virus, allergies and asthma but expect you to continue to do well back on the inhaler Wixela and other allergy treatment.  Sorry to hear about the cost of the [place].  I will refer you to clinical pharmacist to review  options.  They should be reaching out to you in the next week.  Take care!    Signed,   Meredith Staggers, MD [facility] 04/09/23 10:55 AM

## 2023-04-09 NOTE — Patient Instructions (Addendum)
 Glad you are better. I suspect cough was related to the previous virus, allergies and asthma but expect you to continue to do well back on the inhaler Wixela and other allergy treatment.  Sorry to hear about the cost of the [place].  I will refer you to clinical pharmacist to review options.  They should be reaching out to you in the next week.  Take care!

## 2023-04-11 ENCOUNTER — Telehealth: Payer: Self-pay | Admitting: *Deleted

## 2023-04-11 NOTE — Progress Notes (Signed)
 Care Guide Pharmacy Note  04/11/2023 Name: Sabrina Knight MRN: [id number] DOB: 1950-07-08  Referred By: Shade Flood, MD Reason for referral: Care Coordination (Outreach to schedule referral with pharmacist )   Sabrina Knight is a 73 y.o. year old female who is a primary care patient of Shade Flood, MD.  [name] was referred to the pharmacist for assistance related to: DMII  Successful contact was made with the patient to discuss pharmacy services including being ready for the pharmacist to call at least 5 minutes before the scheduled appointment time and to have medication bottles and any blood pressure readings ready for review. The patient agreed to meet with the pharmacist via telephone visit on 04/15/2023  [name], CMA [place]  [facility], [facility] Guide Direct Dial: [phone]  Fax: [phone] Website: [email or web address]

## 2023-04-15 ENCOUNTER — Other Ambulatory Visit: Payer: Self-pay | Admitting: Pharmacist

## 2023-04-15 DIAGNOSIS — E119 Type 2 diabetes mellitus without complications: Secondary | ICD-10-CM

## 2023-04-15 DIAGNOSIS — I420 Dilated cardiomyopathy: Secondary | ICD-10-CM

## 2023-04-15 DIAGNOSIS — I5021 Acute systolic (congestive) heart failure: Secondary | ICD-10-CM

## 2023-04-15 NOTE — Progress Notes (Signed)
 04/15/2023 Name: Sabrina Knight MRN: [id number] DOB: 1950-10-15  Chief Complaint  Patient presents with   Diabetes   Medication Adherence   Medication Management    Sabrina Knight is a 73 y.o. year old female who presented for a telephone visit.   They were referred to the pharmacist by their PCP for assistance in managing diabetes, medication access, and complex medication management.    Subjective:  Medication Access/Adherence  [facility]acy:  [phone] [place] Otto, [place] - [address] [place] [address] Phone: [phone] Fax: [phone]   Patient reports affordability concerns with their medications: Yes  - Cost for Jardiance and Sherryll Burger was $400 for 90 days supply  Patient reports access/transportation concerns to their pharmacy: No  Patient reports adherence concerns with their medications:  No      Diabetes:  Current medications:  Jardiance 10mg  daily  Medications tried in the past: none  Current glucose readings: no readings reported today Using One Touch  meter; testing once daily   Current medication access support: none   Heart Failure (EF 40-45% - 11/2021 ECHO):  Current medications:  ACEi/ARB/ARNI: yes - Entresto 24/26mg  twice a day SGLT2i: Yes - Jardiance 10mg  daily  Beta blocker: metoprolol ER 25mg  daily  Mineralocorticoid Receptor Antagonist: no Diuretic regimen: no   Current medication access support: none   Objective:  Lab Results  Component Value Date   HGBA1C 7.3 (H) 03/06/2023    Lab Results  Component Value Date   CREATININE 0.81 03/06/2023   BUN 17 03/06/2023   NA 141 03/06/2023   K 3.8 03/06/2023   CL 105 03/06/2023   CO2 26 03/06/2023    Lab Results  Component Value Date   CHOL 169 03/06/2023   HDL 50.50 03/06/2023   LDLCALC 78 03/06/2023   LDLDIRECT 91.0 08/16/2021   TRIG 204.0 (H) 03/06/2023   CHOLHDL 3 03/06/2023    Medications Reviewed Today      Reviewed by Henrene Pastor, RPH-CPP (Pharmacist) on 04/15/23 at 0929  Med List Status: <None>   Medication Order Taking? Sig Documenting Provider Last Dose Status Informant  allopurinol (ZYLOPRIM) 100 MG tablet [id number] Yes TAKE 1 TABLET BY MOUTH TWICE A DAY Shade Flood, MD Taking Active   aspirin EC 81 MG tablet [id number] Yes Take 81 mg by mouth daily. Swallow whole. [provider] Taking Active Self  blood glucose meter kit and supplies [id number] Yes Dispense based on patient and insurance preference. Use once per day. Shade Flood, MD Taking Active   Blood Glucose Monitoring Suppl (ONE TOUCH ULTRA 2) w/Device Andria Rhein [id number] Yes CHECK EITHER FASTING OR 2 HOURS AFTER MEALS. Shade Flood, MD Taking Active   budesonide (PULMICORT) 0.5 MG/2ML nebulizer solution [id number] No Take by nebulization as needed (shortness of breath).  Patient not taking: Reported on 04/15/2023   [provider] Not Taking Active   cetirizine (ZYRTEC) 10 MG tablet [id number] Yes Take 10 mg by mouth daily. [provider] Taking Active Self  co-enzyme Q-10 30 MG capsule [id number] Yes Take 30 mg by mouth daily. [provider] Taking Active Self  empagliflozin (JARDIANCE) 10 MG TABS tablet [id number] No Take 1 tablet (10 mg total) by mouth daily before breakfast.  Patient not taking: Reported on 04/15/2023   Jake Bathe, MD Not Taking Active   fluticasone-salmeterol [facility] INHUB) 100-50 MCG/ACT AEPB [id number] Yes Inhale 1 puff into the lungs 2 (two) times daily. Shade Flood,  MD Taking Active   Glycerin-Polysorbate 80 (REFRESH DRY EYE THERAPY OP) [id number] Yes Place 1 drop into both eyes daily as needed (Dry eyes). [provider] Taking Active Self  Krill Oil 300 MG CAPS [id number] Yes Take 300 mg by mouth daily. [provider] Taking Active Self  Lancets Letta Pate [place] PLUS [place]) MISC [id number] Yes USE AS DIRECTED Shade Flood, MD Taking  Active   magnesium gluconate (MAGONATE) 500 MG tablet [id number] Yes Take 500 mg by mouth at bedtime. [provider] Taking Active Self  metoprolol succinate (TOPROL-XL) 25 MG 24 hr tablet [id number] Yes TAKE 1 TABLET (25 MG TOTAL) BY MOUTH DAILY. Jake Bathe, MD Taking Active            Med Note [facility], Sabrina Knight B   Tue Apr 15, 2023  9:28 AM)    montelukast (SINGULAIR) 10 MG tablet [id number] Yes Take 1 tablet (10 mg total) by mouth daily.  Patient taking differently: Take 10 mg by mouth at bedtime.   Shade Flood, MD Taking Active Self  Multiple Vitamin (MULTIVITAMIN WITH MINERALS) TABS tablet [id number] Yes Take 1 tablet by mouth daily. Woman [provider] Taking Active Self  Koren Bound test strip [id number] Yes USE 1 STRIP DAILY AS DIRECTED Shade Flood, MD Taking Active   rosuvastatin (CRESTOR) 10 MG tablet [id number] Yes Take 1 tablet (10 mg total) by mouth daily. TAKE 1 TABLET BY MOUTH DAILY IN THE EVENING 5 DAYS PER WEEK Shade Flood, MD Taking Active   sacubitril-valsartan [facility]) 24-26 MG [id number] Yes Take 1 tablet by mouth 2 (two) times daily. Jake Bathe, MD Taking Active   Tart Cherry 1200 MG CAPS [id number] Yes Take 1,200 mg by mouth at bedtime. [provider] Taking Active Self  VITAMIN D, CHOLECALCIFEROL, PO [id number] Yes Take 500 mg by mouth daily. [provider] Taking Active Self  vitamin E 180 MG (400 UNITS) capsule [id number] Yes Take 400 Units by mouth daily. [provider] Taking Active Self            Lupita Shutter approved:  [employer] ID: [id number] Patient: Sabrina Knight Status: Approved Start Date: 03/16/2023 End Date: 03/14/2024 Kennedy Bucker Balance: $10000.00 Pharmacy Card Card No.: [id number] BIN: [id number] PCN: [id number] PC Group: [id number] Help Desk: [phone]   Assessment/Plan:   Diabetes:Last A1c was 7.3%  - Recommend to continue Jardiance 10mg  daily - if A1c still elevated at next  A1c check consider increasing to 25mg  daily   - Recommend to check glucose once daily - Meets financial criteria for [employer] for [employer].    Heart Failure:Currently appropriately managed - Recommend to continue current therapy and follow up with Dr Anne Fu as recommended.   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 Patient was approved for $10000 with [employer]. These grant will cover Jardiance and Sherryll Burger cost.  - [facility] and [facility].  - Mailed approval letter and card information to patient.    Follow Up Plan: 1 year to  reapply for [facility].   Marland Kitchentbes

## 2023-04-28 ENCOUNTER — Encounter: Payer: Self-pay | Admitting: Cardiology

## 2023-04-28 ENCOUNTER — Ambulatory Visit: Payer: Medicare HMO | Attending: Cardiology | Admitting: Cardiology

## 2023-04-28 VITALS — BP 120/78 | HR 65 | Ht 64.0 in | Wt 202.0 lb

## 2023-04-28 DIAGNOSIS — I447 Left bundle-branch block, unspecified: Secondary | ICD-10-CM | POA: Diagnosis not present

## 2023-04-28 DIAGNOSIS — I428 Other cardiomyopathies: Secondary | ICD-10-CM

## 2023-04-28 DIAGNOSIS — I5022 Chronic systolic (congestive) heart failure: Secondary | ICD-10-CM

## 2023-04-28 DIAGNOSIS — E785 Hyperlipidemia, unspecified: Secondary | ICD-10-CM

## 2023-04-28 DIAGNOSIS — I493 Ventricular premature depolarization: Secondary | ICD-10-CM

## 2023-04-28 NOTE — Patient Instructions (Signed)
 Medication Instructions:  The current medical regimen is effective;  continue present plan and medications.  *If you need a refill on your cardiac medications before your next appointment, please call your pharmacy*  Testing/Procedures: Your physician has requested that you have an echocardiogram. Echocardiography is a painless test that uses sound waves to create images of your heart. It provides your doctor with information about the size and shape of your heart and how well your heart's chambers and valves are working. This procedure takes approximately one hour. There are no restrictions for this procedure. Please do NOT wear cologne, perfume, aftershave, or lotions (deodorant is allowed). Please arrive 15 minutes prior to your appointment time.  Please note: We ask at that you not bring children with you during ultrasound (echo/ vascular) testing. Due to room size and safety concerns, children are not allowed in the ultrasound rooms during exams. Our front office staff cannot provide observation of children in our lobby area while testing is being conducted. An adult accompanying a patient to their appointment will only be allowed in the ultrasound room at the discretion of the ultrasound technician under special circumstances. We apologize for any inconvenience.   Follow-Up: At [facility], you and your health needs are our priority.  As part of our continuing mission to provide you with exceptional heart care, our providers are all part of one team.  This team includes your primary Cardiologist (physician) and Advanced Practice Providers or APPs (Physician Assistants and Nurse Practitioners) who all work together to provide you with the care you need, when you need it.  Your next appointment:   1 year(s)  Provider:   Donato Schultz, MD    We recommend signing up for the patient portal called "MyChart".  Sign up information is provided on this After Visit Summary.  MyChart is used  to connect with patients for Virtual Visits (Telemedicine).  Patients are able to view lab/test results, encounter notes, upcoming appointments, etc.  Non-urgent messages can be sent to your provider as well.   To learn more about what you can do with MyChart, go to [email or web address].      1st Floor: - Lobby - Registration  - Pharmacy  - Lab - Cafe  2nd Floor: - PV Lab - Diagnostic Testing (echo, CT, nuclear med)  3rd Floor: - Vacant  4th Floor: - TCTS (cardiothoracic surgery) - AFib Clinic - Structural Heart Clinic - Vascular Surgery  - Vascular Ultrasound  5th Floor: - HeartCare Cardiology (general and EP) - Clinical Pharmacy for coumadin, hypertension, lipid, weight-loss medications, and med management appointments    Valet parking services will be available as well.

## 2023-04-28 NOTE — Progress Notes (Signed)
 Cardiology Office Note:  .   Date:  04/28/2023  ID:  Sabrina Knight, DOB [birth year], MRN [id number] PCP: Shade Flood, MD  [facility] Providers Cardiologist:  Donato Schultz, MD     History of Present Illness: .   Sabrina Knight is a 73 y.o. female Discussed the use of AI scribe software for clinical note transcription with the patient, who gave verbal consent to proceed.  History of Present Illness Sabrina Knight is a 73 year old female with non-ischemic cardiomyopathy who presents for follow-up.  She has non-ischemic cardiomyopathy with an ejection fraction ranging between 35% and 45%. A left heart catheterization on July 19, 2021, showed no significant coronary artery disease and a left ventricular end-diastolic pressure between 16 and 20 mmHg. Her last echocardiogram in 2023 showed an ejection fraction of 30% to 35%. She is on Entresto 24/26 mg twice daily, aspirin 81 mg daily, Jardiance 10 mg daily, and metoprolol 25 mg daily. She also takes Crestor 10 mg five days a week due to previous statin intolerance. She feels generally well, although she experienced a transient episode last week that was not painful but noticeable.  She experiences occasional premature ventricular contractions, with a burden of 5.3% as recorded on Xiomonitor. She feels some energy issues and lethargy at times.  She has a history of asthma and allergies, for which she uses Pulmicort. Her allergies have been particularly challenging this season, requiring her to take medication both in the morning and at night. Despite this, she enjoys outdoor activities and finds the blooming season beautiful.  She has a history of statin intolerance and previously experienced a cough with lisinopril. She is currently on Crestor 10 mg five days a week, which is tolerated.  Socially, she enjoys water aerobics at the [facility] and attending musicals at the [facility]. She follows a Mediterranean diet, although she notes  that as a 'southern girl,' she does not adhere to it strictly. She quit smoking in 2005.     Studies Reviewed: Marland Kitchen   EKG Interpretation Date/Time:  Monday April 28 2023 09:19:47 EDT Ventricular Rate:  65 PR Interval:  156 QRS Duration:  162 QT Interval:  444 QTC Calculation: 461 R Axis:   41  Text Interpretation: Normal sinus rhythm Left bundle branch block When compared with ECG of 31-Dec-1999 10:16, Premature supraventricular complexes are no longer Present Left bundle branch block is now Present Confirmed by Donato Schultz ([id number]) on 04/28/2023 9:21:13 AM    Results LABS Creatinine: 0.8 Hemoglobin: 15.2 LDL: 78  DIAGNOSTIC PVCs: 1.9% on Xiomonitor Left heart catheterization: No significant CAD, LVEDP 16-20 mmHg (07/19/2021) Echocardiogram: EF 30-35% (2023)  Risk Assessment/Calculations:            Physical Exam:   VS:  BP 120/78   Pulse 65   Ht 5\' 4"  (1.626 m)   Wt 202 lb (91.6 kg)   SpO2 94%   BMI 34.67 kg/m    Wt Readings from Last 3 Encounters:  04/28/23 202 lb (91.6 kg)  04/09/23 200 lb 6.4 oz (90.9 kg)  03/06/23 199 lb 6.4 oz (90.4 kg)    GEN: Well nourished, well developed in no acute distress NECK: No JVD; No carotid bruits CARDIAC: RRR, no murmurs, no rubs, no gallops RESPIRATORY:  Clear to auscultation without rales, wheezing or rhonchi  ABDOMEN: Soft, non-tender, non-distended EXTREMITIES:  No edema; No deformity   ASSESSMENT AND PLAN: .    Assessment and Plan Assessment & Plan Nonischemic  cardiomyopathy with chronic systolic heart failure Nonischemic cardiomyopathy with an ejection fraction (EF) previously ranging between 30-35% and 35-45%. Currently on Entresto 24/26 mg BID, aspirin 81 mg daily, Jardiance 10 mg daily, and metoprolol 25 mg daily. Blood pressure is well-controlled, and kidney function and hemoglobin levels are normal. Reports feeling well with good energy levels and no significant cardiac symptoms. Recent left heart catheterization  showed no significant coronary artery disease. Current management is effective. - Continue Entresto 24/26 mg BID - Continue aspirin 81 mg daily - Continue Jardiance 10 mg daily - Continue metoprolol 25 mg daily - Order echocardiogram to assess cardiac function  Premature ventricular contractions (PVCs) Experiences occasional PVCs, with a burden of 5.3% on Xiomonitor. No significant concern regarding the PVCs at this time.  Hyperlipidemia On rosuvastatin (Crestor) 10 mg daily, which she tolerates well at this dose. LDL is slightly elevated at 78 mg/dL, but given her history of statin intolerance at higher doses, the current management is deemed appropriate. - Continue rosuvastatin (Crestor) 10 mg daily  Asthma due to seasonal allergies Asthma exacerbated by seasonal allergies. Uses Pulmicort and other allergy medications to manage symptoms. Reports that the allergies have been particularly harsh this season, but she is managing well with her current medication regimen. - Continue Pulmicort as needed - Continue allergy medications as needed          Signed, Donato Schultz, MD

## 2023-05-14 ENCOUNTER — Other Ambulatory Visit: Payer: Self-pay | Admitting: Cardiology

## 2023-05-28 ENCOUNTER — Ambulatory Visit (HOSPITAL_COMMUNITY): Attending: Cardiology

## 2023-05-28 ENCOUNTER — Encounter: Payer: Self-pay | Admitting: Cardiology

## 2023-05-28 DIAGNOSIS — I5022 Chronic systolic (congestive) heart failure: Secondary | ICD-10-CM | POA: Diagnosis not present

## 2023-05-28 LAB — ECHOCARDIOGRAM COMPLETE
Area-P 1/2: 3.37 cm2
S' Lateral: 4.3 cm

## 2023-06-11 ENCOUNTER — Ambulatory Visit (INDEPENDENT_AMBULATORY_CARE_PROVIDER_SITE_OTHER): Admitting: Family Medicine

## 2023-06-11 ENCOUNTER — Encounter: Payer: Self-pay | Admitting: Family Medicine

## 2023-06-11 VITALS — BP 128/78 | HR 65 | Temp 98.2°F | Ht 65.0 in | Wt 201.8 lb

## 2023-06-11 DIAGNOSIS — Z7984 Long term (current) use of oral hypoglycemic drugs: Secondary | ICD-10-CM | POA: Diagnosis not present

## 2023-06-11 DIAGNOSIS — J4530 Mild persistent asthma, uncomplicated: Secondary | ICD-10-CM | POA: Diagnosis not present

## 2023-06-11 DIAGNOSIS — R809 Proteinuria, unspecified: Secondary | ICD-10-CM

## 2023-06-11 DIAGNOSIS — E1129 Type 2 diabetes mellitus with other diabetic kidney complication: Secondary | ICD-10-CM | POA: Diagnosis not present

## 2023-06-11 LAB — COMPREHENSIVE METABOLIC PANEL WITH GFR
ALT: 26 U/L (ref 0–35)
AST: 19 U/L (ref 0–37)
Albumin: 4.2 g/dL (ref 3.5–5.2)
Alkaline Phosphatase: 82 U/L (ref 39–117)
BUN: 16 mg/dL (ref 6–23)
CO2: 29 meq/L (ref 19–32)
Calcium: 9.6 mg/dL (ref 8.4–10.5)
Chloride: 106 meq/L (ref 96–112)
Creatinine, Ser: 0.91 mg/dL (ref 0.40–1.20)
GFR: 62.39 mL/min (ref >60.00)
Glucose, Bld: 127 mg/dL — ABNORMAL HIGH (ref 70–99)
Potassium: 4.3 meq/L (ref 3.5–5.1)
Sodium: 143 meq/L (ref 135–145)
Total Bilirubin: 0.4 mg/dL (ref 0.2–1.2)
Total Protein: 7.3 g/dL (ref 6.0–8.3)

## 2023-06-11 LAB — HEMOGLOBIN A1C: Hgb A1c MFr Bld: 7.6 % — ABNORMAL HIGH (ref 4.6–6.5)

## 2023-06-11 NOTE — Progress Notes (Signed)
 Subjective:  Patient ID: Sabrina Knight, female    DOB: 1950-03-28  Age: 73 y.o. MRN: [id number]  CC:  Chief Complaint  Patient presents with   Follow-up    No side effects, states everything is fine medication wise. Needs referral for diabetic eye exam.     HPI Sabrina Knight presents for   Diabetes: With microalbuminuria, chronic heart failure with reduced EF and ICM. Some cost issues with medications previously.  Referred to care management, pharmacist last visit to assist. Pharmacy note from March 25 noted.  Continued on Jardiance , and met financial criteria for [employer] with cardiomyopathy to cover cost of Entresto  and Jardiance .  1 year follow-up with pharmacist to reapply for grant. Taking Jardiance  10mg  every day. She is on statin, and ARB.  Rare home readings - low 110's.  No 200's or lows.  Watching diet.  Microalbumin: nl ratio on 03/06/23.  Optho, foot exam, pneumovax:  Optho - 3-4 weeks ago - Dr. Carloyn Chi.   Lab Results  Component Value Date   HGBA1C 7.3 (H) 03/06/2023   HGBA1C 7.1 (H) 08/29/2022   HGBA1C 7.3 (H) 02/28/2022   Lab Results  Component Value Date   MICROALBUR <0.7 03/06/2023   LDLCALC 78 03/06/2023   CREATININE 0.81 03/06/2023   Asthma Previously treated with budesonide 9.5 mg neb, Symbicort  80/4.5.  Had been off those meds when discussed in February, unknown reason.  Also had been off Singulair  at that time.  She is on Entresto  with CHF. Discussed March 19, cough was improving on Wixela 100/50.  Was also back on her Singulair  as well as Zyrtec. Doing well - has been able to be outside more this spring with meds. Wixela once per day.    History Patient Active Problem List   Diagnosis Date Noted   Shortness of breath    Precordial chest pain 07/18/2021   PVC's (premature ventricular contractions) 07/18/2021   Degenerative disc disease, lumbar 07/12/2020   Degenerative arthritis of left knee 05/26/2020   Greater trochanteric bursitis  of both hips 05/26/2020   Mild persistent asthma, uncomplicated 01/08/2019   Vasomotor rhinitis 01/08/2019   Other allergic rhinitis 01/08/2019   Bilateral carpal tunnel syndrome 09/25/2018   Dyslipidemia 10/22/2017   Statin intolerance 10/22/2017   Newly diagnosed diabetes (HCC) 10/22/2017   Gout 12/23/2016   Class 1 obesity due to excess calories with serious comorbidity and body mass index (BMI) of 31.0 to 31.9 in adult 05/19/2016   Osteopenia of necks of both femurs 05/19/2016   Thrombocytosis 02/08/2014   High cholesterol 02/01/2013   H/O splenectomy 02/01/2013   Non-allergic rhinitis 02/01/2013   Glaucoma 02/01/2013   Past Medical History:  Diagnosis Date   Allergy    Zyrtec, Singulair ; allergy testing negative; Sabrina Knight.   Arthritis    Asthma due to seasonal allergies    Cataract    Clotting disorder (HCC)    Glaucoma    Gout    ITP (idiopathic thrombocytopenic purpura)    Past Surgical History:  Procedure Laterality Date   ABDOMINAL HYSTERECTOMY  01/22/1999   ovaries resected; DUB/fibroids   CARPAL TUNNEL RELEASE Right 09/11/2018   Procedure: CARPAL TUNNEL RELEASE;  Surgeon: Lyanne Sample, MD;  Location: [facility];  Service: Orthopedics;  Laterality: Right;   CARPAL TUNNEL RELEASE Left 10/02/2018   Procedure: CARPAL TUNNEL RELEASE;  Surgeon: Lyanne Sample, MD;  Location: [facility];  Service: Orthopedics;  Laterality: Left;  FAB   EYE SURGERY N/A  [facility] 06/26/2019   LEFT HEART CATH AND CORONARY ANGIOGRAPHY N/A 07/19/2021   Procedure: LEFT HEART CATH AND CORONARY ANGIOGRAPHY;  Surgeon: Odie Benne, MD;  Location: [facility];  Service: Cardiovascular;  Laterality: N/A;   spleen removal  01/22/1995   for ITP   spleen removal     Allergies  Allergen Reactions   Lisinopril  Cough   Prior to Admission medications   Medication Sig Start Date End Date Taking? Authorizing Provider  allopurinol  (ZYLOPRIM ) 100 MG tablet TAKE 1  TABLET BY MOUTH TWICE A DAY 12/30/22  Yes Benjiman Bras, MD  aspirin  EC 81 MG tablet Take 81 mg by mouth daily. Swallow whole.   Yes [provider]  blood glucose meter kit and supplies Dispense based on patient and insurance preference. Use once per day. 10/26/21  Yes Benjiman Bras, MD  Blood Glucose Monitoring Suppl (ONE TOUCH ULTRA 2) w/Device KIT CHECK EITHER FASTING OR 2 HOURS AFTER MEALS. 04/05/22  Yes Benjiman Bras, MD  budesonide (PULMICORT) 0.5 MG/2ML nebulizer solution Take by nebulization as needed (shortness of breath). 04/09/21  Yes [provider]  cetirizine (ZYRTEC) 10 MG tablet Take 10 mg by mouth daily.   Yes [provider]  co-enzyme Q-10 30 MG capsule Take 30 mg by mouth daily.   Yes [provider]  empagliflozin  (JARDIANCE ) 10 MG TABS tablet TAKE 1 TABLET BY MOUTH DAILY BEFORE BREAKFAST. 05/15/23  Yes Hugh Madura, MD  fluticasone -salmeterol (WIXELA INHUB) 100-50 MCG/ACT AEPB Inhale 1 puff into the lungs 2 (two) times daily. 03/07/23  Yes Benjiman Bras, MD  Glycerin-Polysorbate 80 (REFRESH DRY EYE THERAPY OP) Place 1 drop into both eyes daily as needed (Dry eyes).   Yes [provider]  Sabrina Knight Oil 300 MG CAPS Take 300 mg by mouth daily.   Yes [provider]  Lancets [facility] DELICA PLUS LANCET33G) MISC USE AS DIRECTED 11/26/22  Yes Benjiman Bras, MD  magnesium gluconate (MAGONATE) 500 MG tablet Take 500 mg by mouth at bedtime.   Yes [provider]  metoprolol  succinate (TOPROL -XL) 25 MG 24 hr tablet TAKE 1 TABLET (25 MG TOTAL) BY MOUTH DAILY. 05/15/23  Yes Hugh Madura, MD  montelukast  (SINGULAIR ) 10 MG tablet Take 1 tablet (10 mg total) by mouth daily. Patient taking differently: Take 10 mg by mouth at bedtime. 02/07/21  Yes Benjiman Bras, MD  Multiple Vitamin (MULTIVITAMIN WITH MINERALS) TABS tablet Take 1 tablet by mouth daily. Woman   Yes [provider]  [facility] ULTRA test strip  USE 1 STRIP DAILY AS DIRECTED 11/13/22  Yes Benjiman Bras, MD  rosuvastatin  (CRESTOR ) 10 MG tablet Take 1 tablet (10 mg total) by mouth daily. TAKE 1 TABLET BY MOUTH DAILY IN THE EVENING 5 DAYS PER WEEK 03/06/23  Yes Benjiman Bras, MD  sacubitril-valsartan (ENTRESTO ) 24-26 MG Take 1 tablet by mouth 2 (two) times daily. 02/19/23  Yes Hugh Madura, MD  Tart Cherry 1200 MG CAPS Take 1,200 mg by mouth at bedtime.   Yes [provider]  VITAMIN D, CHOLECALCIFEROL, PO Take 500 mg by mouth daily.   Yes [provider]  vitamin E 180 MG (400 UNITS) capsule Take 400 Units by mouth daily.   Yes [provider]   Social History   Socioeconomic History   Marital status: Widowed    Spouse name: Not on file   Number of children: 0   Years of education: Not  on file   Highest education level: 12th grade  Occupational History   Occupation: Retired  Tobacco Use   Smoking status: Former    Current packs/day: 0.00    Types: Cigarettes    Quit date: 12/04/2003    Years since quitting: 19.5   Smokeless tobacco: Never  Vaping Use   Vaping status: Never Used  Substance and Sexual Activity   Alcohol use: No    Alcohol/week: 0.0 standard drinks of alcohol   Drug use: No   Sexual activity: Not Currently    Birth control/protection: Surgical  Other Topics Concern   Not on file  Social History Narrative   Marital status:  Widowed x since 2001 from melanoma; married x 27 years.  Not dating really in 2019.      Children:  None; failed in vitro.      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      Tobacco: quit ; smoked 20 years.      Alcohol: none      Education: [employer].       Exercise: Yes; 3 times per week 2 hours deep water aerobics.      ADLs: independent ADLs; no assistant devices.      Advanced Directives:  None; FULL CODE; no prolonged measures.    HCPOA: Darlene Jackson/sister-in-law.     Social Drivers of Manufacturing engineer Strain: Low Risk  (06/07/2023)   Overall Financial Resource Strain (CARDIA)    Difficulty of Paying Living Expenses: Not hard at all  Food Insecurity: No Food Insecurity (06/07/2023)   Hunger Vital Sign    Worried About Running Out of Food in the Last Year: Never true    Ran Out of Food in the Last Year: Never true  Transportation Needs: No Transportation Needs (06/07/2023)   PRAPARE - Administrator, Civil Service (Medical): No    Lack of Transportation (Non-Medical): No  Physical Activity: Sufficiently Active (06/07/2023)   Exercise Vital Sign    Days of Exercise per Week: 3 days    Minutes of Exercise per Session: 70 min  Stress: No Stress Concern Present (06/07/2023)   [employer] of Occupational Health - Occupational Stress Questionnaire    Feeling of Stress : Not at all  Social Connections: Moderately Integrated (06/07/2023)   Social Connection and Isolation Panel [NHANES]    Frequency of Communication with Friends and Family: More than three times a week    Frequency of Social Gatherings with Friends and Family: Three times a week    Attends Religious Services: More than 4 times per year    Active Member of Clubs or Organizations: Yes    Attends Banker Meetings: More than 4 times per year    Marital Status: Widowed  Intimate Partner Violence: Not At Risk (11/06/2022)   Humiliation, Afraid, Rape, and Kick questionnaire    Fear of Current or Ex-Partner: No    Emotionally Abused: No    Physically Abused: No    Sexually Abused: No    Review of Systems  Constitutional:  Negative for fatigue and unexpected weight change.  Respiratory:  Negative for chest tightness and shortness of breath.   Cardiovascular:  Negative for chest pain, palpitations and leg swelling.  Gastrointestinal:  Negative for abdominal pain and blood in stool.  Neurological:  Negative for dizziness, syncope, light-headedness and headaches.     Objective:  Vitals:   06/11/23 0825  BP: 128/78  Pulse: 65  Temp: 98.2 F (36.8 C)  TempSrc: Oral  SpO2: 92%  Weight: 201 lb 12.8 oz (91.5 kg)  Height: 5\' 5"  (1.651 m)     Physical Exam Vitals reviewed.  Constitutional:      Appearance: Normal appearance. She is well-developed.  HENT:     Head: Normocephalic and atraumatic.  Eyes:     Conjunctiva/sclera: Conjunctivae normal.     Pupils: Pupils are equal, round, and reactive to light.  Neck:     Vascular: No carotid bruit.  Cardiovascular:     Rate and Rhythm: Normal rate and regular rhythm.     Heart sounds: Normal heart sounds.  Pulmonary:     Effort: Pulmonary effort is normal.     Breath sounds: Normal breath sounds.  Abdominal:     Palpations: Abdomen is soft. There is no pulsatile mass.     Tenderness: There is no abdominal tenderness.  Musculoskeletal:     Right lower leg: No edema.     Left lower leg: No edema.  Skin:    General: Skin is warm and dry.  Neurological:     Mental Status: She is alert and oriented to person, place, and time.  Psychiatric:        Mood and Affect: Mood normal.        Behavior: Behavior normal.        Assessment & Plan:  DAMAYA CHANNING is a 73 y.o. female . Type 2 diabetes mellitus with microalbuminuria, without long-term current use of insulin (HCC) - Plan: CANCELED: POCT glycosylated hemoglobin (Hb A1C)  - Tolerating Jardiance , appreciate pharmacy assistance for assisting with cost.  Check A1c and depending on reading will increase dose.  No other changes for now.  40-month follow-up, and if stable at that time consider 13-month intervals  Mild persistent asthma, uncomplicated - Plan: Comprehensive metabolic panel with GFR, Hemoglobin A1c  - Improved, stable with use of Wixela, other allergy treatments.  Continue same.  Option of twice per day dosing Wixela.  No orders of the defined types were placed in this encounter.  Patient Instructions  Thank you for coming in today.  Glad  to hear you are doing well.  No change in asthma medications at this time or allergy meds.  For diabetes I will check your labs and if the A1c is still elevated we do have the option to increase the Jardiance  but I will let you know.  Keep follow-up with your other specialists, no other med changes at this time.  Follow-up in 3 months but let me know if there are questions sooner and take care!     Signed,   Caro Christmas, MD [facility], [facility] 06/11/23 9:03 AM

## 2023-06-11 NOTE — Patient Instructions (Signed)
 Thank you for coming in today.  Glad to hear you are doing well.  No change in asthma medications at this time or allergy meds.  For diabetes I will check your labs and if the A1c is still elevated we do have the option to increase the Jardiance  but I will let you know.  Keep follow-up with your other specialists, no other med changes at this time.  Follow-up in 3 months but let me know if there are questions sooner and take care!

## 2023-06-12 ENCOUNTER — Other Ambulatory Visit: Payer: Self-pay | Admitting: Family Medicine

## 2023-06-12 DIAGNOSIS — E1129 Type 2 diabetes mellitus with other diabetic kidney complication: Secondary | ICD-10-CM

## 2023-06-13 ENCOUNTER — Other Ambulatory Visit: Payer: Self-pay

## 2023-06-13 DIAGNOSIS — E1129 Type 2 diabetes mellitus with other diabetic kidney complication: Secondary | ICD-10-CM

## 2023-06-17 ENCOUNTER — Ambulatory Visit: Payer: Self-pay | Admitting: Family Medicine

## 2023-06-23 NOTE — Telephone Encounter (Signed)
 Copied from CRM [id number]. Topic: Clinical - Lab/Test Results >> Jun 23, 2023 11:02 AM Earnestine Goes B wrote: Reason for CRM: pt returned a call for willow, pt is requesting  a call back [phone]

## 2023-06-25 NOTE — Telephone Encounter (Signed)
-----   Message from Benjiman Bras sent at 06/22/2023 10:06 AM EDT ----- Results sent by MyChart, but appears patient has not yet reviewed those results.  Please call and make sure they have either seen note or discuss result note.  Thanks.

## 2023-06-25 NOTE — Progress Notes (Signed)
 Pt has been notified.

## 2023-07-05 ENCOUNTER — Other Ambulatory Visit: Payer: Self-pay | Admitting: Family Medicine

## 2023-07-05 DIAGNOSIS — R052 Subacute cough: Secondary | ICD-10-CM

## 2023-07-05 DIAGNOSIS — J4530 Mild persistent asthma, uncomplicated: Secondary | ICD-10-CM

## 2023-07-10 ENCOUNTER — Other Ambulatory Visit: Payer: Self-pay | Admitting: Family Medicine

## 2023-07-10 DIAGNOSIS — E785 Hyperlipidemia, unspecified: Secondary | ICD-10-CM

## 2023-07-29 ENCOUNTER — Other Ambulatory Visit: Payer: Self-pay | Admitting: Family Medicine

## 2023-07-29 DIAGNOSIS — M109 Gout, unspecified: Secondary | ICD-10-CM

## 2023-08-06 ENCOUNTER — Other Ambulatory Visit: Payer: Self-pay | Admitting: Cardiology

## 2023-08-22 DIAGNOSIS — H04123 Dry eye syndrome of bilateral lacrimal glands: Secondary | ICD-10-CM | POA: Diagnosis not present

## 2023-08-22 DIAGNOSIS — H40013 Open angle with borderline findings, low risk, bilateral: Secondary | ICD-10-CM | POA: Diagnosis not present

## 2023-09-18 ENCOUNTER — Ambulatory Visit: Admitting: Family Medicine

## 2023-09-18 ENCOUNTER — Encounter: Payer: Self-pay | Admitting: Family Medicine

## 2023-09-18 VITALS — BP 116/72 | HR 73 | Temp 98.1°F | Resp 14 | Ht 65.0 in | Wt 194.8 lb

## 2023-09-18 DIAGNOSIS — J4530 Mild persistent asthma, uncomplicated: Secondary | ICD-10-CM

## 2023-09-18 DIAGNOSIS — I5020 Unspecified systolic (congestive) heart failure: Secondary | ICD-10-CM

## 2023-09-18 DIAGNOSIS — M109 Gout, unspecified: Secondary | ICD-10-CM

## 2023-09-18 DIAGNOSIS — Z7984 Long term (current) use of oral hypoglycemic drugs: Secondary | ICD-10-CM

## 2023-09-18 DIAGNOSIS — E785 Hyperlipidemia, unspecified: Secondary | ICD-10-CM | POA: Diagnosis not present

## 2023-09-18 DIAGNOSIS — R809 Proteinuria, unspecified: Secondary | ICD-10-CM | POA: Diagnosis not present

## 2023-09-18 DIAGNOSIS — E1129 Type 2 diabetes mellitus with other diabetic kidney complication: Secondary | ICD-10-CM

## 2023-09-18 LAB — COMPREHENSIVE METABOLIC PANEL WITH GFR
ALT: 26 U/L (ref 0–35)
AST: 22 U/L (ref 0–37)
Albumin: 4.1 g/dL (ref 3.5–5.2)
Alkaline Phosphatase: 82 U/L (ref 39–117)
BUN: 11 mg/dL (ref 6–23)
CO2: 27 meq/L (ref 19–32)
Calcium: 9.2 mg/dL (ref 8.4–10.5)
Chloride: 105 meq/L (ref 96–112)
Creatinine, Ser: 0.92 mg/dL (ref 0.40–1.20)
GFR: 61.46 mL/min (ref >60.00)
Glucose, Bld: 104 mg/dL — ABNORMAL HIGH (ref 70–99)
Potassium: 3.8 meq/L (ref 3.5–5.1)
Sodium: 141 meq/L (ref 135–145)
Total Bilirubin: 0.4 mg/dL (ref 0.2–1.2)
Total Protein: 7.4 g/dL (ref 6.0–8.3)

## 2023-09-18 LAB — LIPID PANEL
Cholesterol: 155 mg/dL (ref 0–200)
HDL: 42 mg/dL (ref >39.00)
LDL Cholesterol: 76 mg/dL (ref 0–99)
NonHDL: 113.13
Total CHOL/HDL Ratio: 4
Triglycerides: 187 mg/dL — ABNORMAL HIGH (ref 0.0–149.0)
VLDL: 37.4 mg/dL (ref 0.0–40.0)

## 2023-09-18 LAB — HEMOGLOBIN A1C: Hgb A1c MFr Bld: 7.7 % — ABNORMAL HIGH (ref 4.6–6.5)

## 2023-09-18 NOTE — Patient Instructions (Signed)
 Thank you for coming in today. No change in medications at this time, but depending on labs we do have some room to adjust your cholesterol medication in the Jardiance  if needed for blood sugar.  I will let you know. Take care!

## 2023-09-18 NOTE — Progress Notes (Signed)
 Subjective:  Patient ID: Sabrina Knight, female    DOB: 1950/06/03  Age: 73 y.o. MRN: [id number]  CC:  Chief Complaint  Patient presents with   Follow-up    3 month follow up. No questions or concerns.    HPI Sabrina Knight presents for follow up. Doing well. House guests currently with 74yo - keeping her busy.   Diabetes: With microalbuminuria, chronic heart failure with reduced EF Assistance with pharmacist appreciated for medication management. Treated with Jardiance , and on [place] for coverage of Entresto  and Jardiance  Jardiance  10 mg daily at her May 21 visit, she is on statin and ARB. Home readings - none.  No low symptoms.  No mycotic or UTI symptoms.  Microalbumin: Normal ratio 03/06/2023. Optho, foot exam, pneumovax:  Optho - saw earlier this year - Dr. Fleeta at [facility].  Shingrix - at [facility]. Flu vaccine recommended.   Lab Results  Component Value Date   HGBA1C 7.6 (H) 06/11/2023   HGBA1C 7.3 (H) 03/06/2023   HGBA1C 7.1 (H) 08/29/2022   Lab Results  Component Value Date   MICROALBUR <0.7 03/06/2023   LDLCALC 78 03/06/2023   CREATININE 0.91 06/11/2023   Asthma Treated with Wixela 100/50 as of her last visit.  Stable at once daily dosing along with her Singulair  and Zyrtec for allergies. Has not needed wixela recently - has at home. Has been taking singulair  and zyrtec daily. Stable even with being outdoors more.   Gout: Last flare: none recent.  Daily meds: Allopurinol  100 mg twice daily.  Stable uric acid in February. Prn med: None. Lab Results  Component Value Date   LABURIC 4.2 03/06/2023   Hyperlipidemia: With CHF as above, Crestor  10 mg daily. Lab Results  Component Value Date   CHOL 169 03/06/2023   HDL 50.50 03/06/2023   LDLCALC 78 03/06/2023   LDLDIRECT 91.0 08/16/2021   TRIG 204.0 (H) 03/06/2023   CHOLHDL 3 03/06/2023   Lab Results  Component Value Date   ALT 26 06/11/2023   AST 19 06/11/2023   ALKPHOS 82 06/11/2023   BILITOT 0.4  06/11/2023    Cardiac Followed by Dr. Jeffrie.  Chronic systolic heart failure, appointment in April.  Echo with EF 35 to 40% on May 7.  Stable.  Continued on Entresto  24/26 mg twice daily, aspirin  81 mg daily, Jardiance  and metoprolol .  Prior PVCs, 5.3% burden on monitoring.no CP/DOE, no new leg swelling.   History Patient Active Problem List   Diagnosis Date Noted   Shortness of breath    Precordial chest pain 07/18/2021   PVC's (premature ventricular contractions) 07/18/2021   Degenerative disc disease, lumbar 07/12/2020   Degenerative arthritis of left knee 05/26/2020   Greater trochanteric bursitis of both hips 05/26/2020   Mild persistent asthma, uncomplicated 01/08/2019   Vasomotor rhinitis 01/08/2019   Other allergic rhinitis 01/08/2019   Bilateral carpal tunnel syndrome 09/25/2018   Dyslipidemia 10/22/2017   Statin intolerance 10/22/2017   Newly diagnosed diabetes (HCC) 10/22/2017   Gout 12/23/2016   Class 1 obesity due to excess calories with serious comorbidity and body mass index (BMI) of 31.0 to 31.9 in adult 05/19/2016   Osteopenia of necks of both femurs 05/19/2016   Thrombocytosis 02/08/2014   High cholesterol 02/01/2013   H/O splenectomy 02/01/2013   Non-allergic rhinitis 02/01/2013   Glaucoma 02/01/2013   Past Medical History:  Diagnosis Date   Allergy    Zyrtec, Singulair ; allergy testing negative; [facility].   Arthritis  Asthma due to seasonal allergies    Cataract    Clotting disorder (HCC)    Glaucoma    Gout    ITP (idiopathic thrombocytopenic purpura)    Past Surgical History:  Procedure Laterality Date   ABDOMINAL HYSTERECTOMY  01/22/1999   ovaries resected; DUB/fibroids   CARPAL TUNNEL RELEASE Right 09/11/2018   Procedure: CARPAL TUNNEL RELEASE;  Surgeon: Murrell Kuba, MD;  Location: [facility];  Service: Orthopedics;  Laterality: Right;   CARPAL TUNNEL RELEASE Left 10/02/2018   Procedure: CARPAL TUNNEL RELEASE;  Surgeon: Murrell Kuba, MD;  Location:  SURGERY CENTER;  Service: Orthopedics;  Laterality: Left;  FAB   EYE SURGERY N/A    [employer] 06/26/2019   LEFT HEART CATH AND CORONARY ANGIOGRAPHY N/A 07/19/2021   Procedure: LEFT HEART CATH AND CORONARY ANGIOGRAPHY;  Surgeon: Verlin Lonni BIRCH, MD;  Location: [facility];  Service: Cardiovascular;  Laterality: N/A;   spleen removal  01/22/1995   for ITP   spleen removal     Allergies  Allergen Reactions   Lisinopril  Cough   Prior to Admission medications   Medication Sig Start Date End Date Taking? Authorizing Provider  allopurinol  (ZYLOPRIM ) 100 MG tablet TAKE 1 TABLET BY MOUTH TWICE A DAY 07/29/23  Yes Levora Reyes SAUNDERS, MD  aspirin  EC 81 MG tablet Take 81 mg by mouth daily. Swallow whole.   Yes [provider]  blood glucose meter kit and supplies Dispense based on patient and insurance preference. Use once per day. 10/26/21  Yes Levora Reyes SAUNDERS, MD  Blood Glucose Monitoring Suppl (ONE TOUCH ULTRA 2) w/Device KIT CHECK EITHER FASTING OR 2 HOURS AFTER MEALS. 04/05/22  Yes Levora Reyes SAUNDERS, MD  budesonide (PULMICORT) 0.5 MG/2ML nebulizer solution Take by nebulization as needed (shortness of breath). 04/09/21  Yes [provider]  cetirizine (ZYRTEC) 10 MG tablet Take 10 mg by mouth daily.   Yes [provider]  co-enzyme Q-10 30 MG capsule Take 30 mg by mouth daily.   Yes [provider]  empagliflozin  (JARDIANCE ) 10 MG TABS tablet TAKE 1 TABLET BY MOUTH DAILY BEFORE BREAKFAST. 05/15/23  Yes Jeffrie Oneil BROCKS, MD  Glycerin-Polysorbate 80 (REFRESH DRY EYE THERAPY OP) Place 1 drop into both eyes daily as needed (Dry eyes).   Yes [provider]  Lancets [facility] DELICA PLUS LANCET33G) MISC USE AS DIRECTED 06/13/23  Yes Levora Reyes SAUNDERS, MD  magnesium gluconate (MAGONATE) 500 MG tablet Take 500 mg by mouth at bedtime.   Yes [provider]  metoprolol  succinate (TOPROL -XL) 25 MG 24 hr tablet TAKE 1  TABLET (25 MG TOTAL) BY MOUTH DAILY. 05/15/23  Yes Jeffrie Oneil BROCKS, MD  montelukast  (SINGULAIR ) 10 MG tablet Take 1 tablet (10 mg total) by mouth daily. Patient taking differently: Take 10 mg by mouth at bedtime. 02/07/21  Yes Levora Reyes SAUNDERS, MD  Multiple Vitamin (MULTIVITAMIN WITH MINERALS) TABS tablet Take 1 tablet by mouth daily. Woman   Yes [provider]  [facility] ULTRA test strip USE 1 STRIP DAILY AS DIRECTED 11/13/22  Yes Levora Reyes SAUNDERS, MD  rosuvastatin  (CRESTOR ) 10 MG tablet TAKE 1 TABLET BY MOUTH DAILY IN THE EVENING 5 DAYS PER WEEK 07/10/23  Yes Levora Reyes SAUNDERS, MD  sacubitril-valsartan (ENTRESTO ) 24-26 MG TAKE 1 TABLET BY MOUTH TWICE A DAY 08/07/23  Yes Jeffrie Oneil BROCKS, MD  Tart Cherry 1200 MG CAPS Take 1,200 mg by mouth at bedtime.   Yes [provider]  VITAMIN D, CHOLECALCIFEROL, PO Take 500 mg by mouth daily.   Yes [provider]  vitamin E 180 MG (400 UNITS) capsule Take 400 Units by mouth daily.   Yes [provider]  NAPOLEON INHUB 100-50 MCG/ACT AEPB INHALE 1 PUFF INTO THE LUNGS TWICE A DAY 07/07/23  Yes Levora Reyes SAUNDERS, MD  Krill Oil 300 MG CAPS Take 300 mg by mouth daily. Patient not taking: Reported on 09/18/2023    [provider]   Social History   Socioeconomic History   Marital status: Widowed    Spouse name: Not on file   Number of children: 0   Years of education: Not on file   Highest education level: 12th grade  Occupational History   Occupation: Retired  Tobacco Use   Smoking status: Former    Current packs/day: 0.00    Types: Cigarettes    Quit date: 12/04/2003    Years since quitting: 19.8   Smokeless tobacco: Never  Vaping Use   Vaping status: Never Used  Substance and Sexual Activity   Alcohol use: No    Alcohol/week: 0.0 standard drinks of alcohol   Drug use: No   Sexual activity: Not Currently    Birth control/protection: Surgical  Other Topics Concern   Not on file  Social History Narrative    Marital status:  Widowed x since 2001 from melanoma; married x 27 years.  Not dating really in 2019.      Children:  None; failed in vitro.      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      Tobacco: quit ; smoked 20 years.      Alcohol: none      Education: [employer].       Exercise: Yes; 3 times per week 2 hours deep water aerobics.      ADLs: independent ADLs; no assistant devices.      Advanced Directives:  None; FULL CODE; no prolonged measures.    HCPOA: Darlene Jackson/sister-in-law.     Social Drivers of Corporate investment banker Strain: Low Risk  (06/07/2023)   Overall Financial Resource Strain (CARDIA)    Difficulty of Paying Living Expenses: Not hard at all  Food Insecurity: No Food Insecurity (06/07/2023)   Hunger Vital Sign    Worried About Running Out of Food in the Last Year: Never true    Ran Out of Food in the Last Year: Never true  Transportation Needs: No Transportation Needs (06/07/2023)   PRAPARE - Administrator, Civil Service (Medical): No    Lack of Transportation (Non-Medical): No  Physical Activity: Sufficiently Active (06/07/2023)   Exercise Vital Sign    Days of Exercise per Week: 3 days    Minutes of Exercise per Session: 70 min  Stress: No Stress Concern Present (06/07/2023)   [employer] of Occupational Health - Occupational Stress Questionnaire    Feeling of Stress : Not at all  Social Connections: Moderately Integrated (06/07/2023)   Social Connection and Isolation Panel    Frequency of Communication with Friends and Family: More than three times a week    Frequency of Social Gatherings with Friends and Family: Three times a week    Attends Religious Services: More than 4 times per year    Active Member of Clubs or Organizations: Yes    Attends Banker Meetings: More than 4 times per year  Marital Status: Widowed  Intimate Partner Violence: Not At Risk (11/06/2022)    Humiliation, Afraid, Rape, and Kick questionnaire    Fear of Current or Ex-Partner: No    Emotionally Abused: No    Physically Abused: No    Sexually Abused: No    Review of Systems  Constitutional:  Negative for fatigue and unexpected weight change.  Respiratory:  Negative for chest tightness and shortness of breath.   Cardiovascular:  Negative for chest pain, palpitations and leg swelling.  Gastrointestinal:  Negative for abdominal pain and blood in stool.  Neurological:  Negative for dizziness, syncope, light-headedness and headaches.     Objective:   Vitals:   09/18/23 1004  BP: 116/72  Pulse: 73  Resp: 14  Temp: 98.1 F (36.7 C)  TempSrc: Temporal  SpO2: 93%  Weight: 194 lb 12.8 oz (88.4 kg)  Height: 5' 5 (1.651 m)     Physical Exam Vitals reviewed.  Constitutional:      Appearance: Normal appearance. She is well-developed.  HENT:     Head: Normocephalic and atraumatic.  Eyes:     Conjunctiva/sclera: Conjunctivae normal.     Pupils: Pupils are equal, round, and reactive to light.  Neck:     Vascular: No carotid bruit.  Cardiovascular:     Rate and Rhythm: Normal rate and regular rhythm.     Heart sounds: Normal heart sounds.  Pulmonary:     Effort: Pulmonary effort is normal.     Breath sounds: Normal breath sounds.  Abdominal:     Palpations: Abdomen is soft. There is no pulsatile mass.     Tenderness: There is no abdominal tenderness.  Musculoskeletal:     Right lower leg: No edema.     Left lower leg: No edema.  Skin:    General: Skin is warm and dry.  Neurological:     Mental Status: She is alert and oriented to person, place, and time.  Psychiatric:        Mood and Affect: Mood normal.        Behavior: Behavior normal.        Assessment & Plan:  Sabrina Knight is a 73 y.o. female . Type 2 diabetes mellitus with microalbuminuria, without long-term current use of insulin (HCC) - Plan: Hemoglobin A1c  - Tolerating Jardiance , check labs  and then consider higher dosing depending on A1c.  No other changes for now.  HFrEF (heart failure with reduced ejection fraction) (HCC)  - Appears euvolemic, stable EF on most recent echo, continue same med regimen and follow-up with cardiology as planned.  Hyperlipidemia LDL goal <100 - Plan: Comprehensive metabolic panel with GFR, Lipid panel  - Check labs, option of higher dosing of statin.  Adjustment based on lab results  Mild persistent asthma, uncomplicated  - Stable, off inhaler currently, option to restart during fall, or if flare of symptoms with allergies.  Stable with use of antihistamine and Singulair .  Gout, unspecified cause, unspecified chronicity, unspecified site  - No recent flare, continue allopurinol .  No orders of the defined types were placed in this encounter.  Patient Instructions  Thank you for coming in today. No change in medications at this time, but depending on labs we do have some room to adjust your cholesterol medication in the Jardiance  if needed for blood sugar.  I will let you know. Take care!     Signed,   Reyes Pines, MD [facility], [facility] 09/18/23  10:55 AM

## 2023-09-22 ENCOUNTER — Ambulatory Visit: Payer: Self-pay | Admitting: Family Medicine

## 2023-09-22 DIAGNOSIS — E1129 Type 2 diabetes mellitus with other diabetic kidney complication: Secondary | ICD-10-CM

## 2023-09-22 MED ORDER — EMPAGLIFLOZIN 25 MG PO TABS: 25.0000 mg | ORAL_TABLET | Freq: Every day | ORAL | 5 refills | Status: AC

## 2023-09-29 NOTE — Progress Notes (Signed)
 Lab results have been discussed.   Verbalized understanding? Yes  Are there any questions? No  Patient will take 25mg  daily of Jardiance  as advised.

## 2023-10-14 DIAGNOSIS — Z822 Family history of deafness and hearing loss: Secondary | ICD-10-CM | POA: Diagnosis not present

## 2023-10-14 DIAGNOSIS — Z8481 Family history of carrier of genetic disease: Secondary | ICD-10-CM | POA: Diagnosis not present

## 2023-10-14 DIAGNOSIS — Z1371 Encounter for nonprocreative screening for genetic disease carrier status: Secondary | ICD-10-CM | POA: Diagnosis not present

## 2023-10-14 DIAGNOSIS — Z1589 Genetic susceptibility to other disease: Secondary | ICD-10-CM | POA: Diagnosis not present

## 2023-10-14 DIAGNOSIS — H93012 Transient ischemic deafness, left ear: Secondary | ICD-10-CM | POA: Diagnosis not present

## 2023-10-24 ENCOUNTER — Other Ambulatory Visit: Payer: Self-pay | Admitting: Family Medicine

## 2023-10-24 DIAGNOSIS — J4530 Mild persistent asthma, uncomplicated: Secondary | ICD-10-CM

## 2023-10-24 DIAGNOSIS — R052 Subacute cough: Secondary | ICD-10-CM

## 2023-12-08 DIAGNOSIS — Z1231 Encounter for screening mammogram for malignant neoplasm of breast: Secondary | ICD-10-CM | POA: Diagnosis not present

## 2023-12-08 LAB — HM MAMMOGRAPHY

## 2023-12-09 ENCOUNTER — Ambulatory Visit: Payer: Medicare HMO | Admitting: *Deleted

## 2023-12-09 ENCOUNTER — Encounter: Payer: Self-pay | Admitting: Family Medicine

## 2023-12-09 VITALS — Ht 65.0 in | Wt 194.0 lb

## 2023-12-09 DIAGNOSIS — Z0000 Encounter for general adult medical examination without abnormal findings: Secondary | ICD-10-CM | POA: Diagnosis not present

## 2023-12-09 NOTE — Patient Instructions (Addendum)
 [name],  Thank you for taking the time for your Medicare Wellness Visit. I appreciate your continued commitment to your health goals. Please review the care plan we discussed, and feel free to reach out if I can assist you further.  Please note that Annual Wellness Visits do not include a physical exam. Some assessments may be limited, especially if the visit was conducted virtually. If needed, we may recommend an in-person follow-up with your provider.  Ongoing Care Seeing your primary care provider every 3 to 6 months helps us  monitor your health and provide consistent, personalized care.   Referrals If a referral was made during today's visit and you haven't received any updates within two weeks, please contact the referred provider directly to check on the status.  Recommended Screenings:  Health Maintenance  Topic Date Due   Zoster (Shingles) Vaccine (1 of 2) 06/24/1968   Flu Shot  08/22/2023   Yearly kidney health urinalysis for diabetes  03/05/2024   Complete foot exam   03/05/2024   Hemoglobin A1C  03/20/2024   Eye exam for diabetics  08/21/2024   Yearly kidney function blood test for diabetes  09/17/2024   Breast Cancer Screening  12/07/2024   Medicare Annual Wellness Visit  12/08/2024   Colon Cancer Screening  02/27/2027   DTaP/Tdap/Td vaccine (3 - Td or Tdap) 06/10/2030   Pneumococcal Vaccine for age over 16  Completed   DEXA scan (bone density measurement)  Completed   Hepatitis C Screening  Completed   Meningitis B Vaccine  Aged Out   COVID-19 Vaccine  Discontinued       12/09/2023    1:15 PM  Advanced Directives  Does Patient Have a Medical Advance Directive? No  Would patient like information on creating a medical advance directive? No - Patient declined    Vision: Annual vision screenings are recommended for early detection of glaucoma, cataracts, and diabetic retinopathy. These exams can also reveal signs of chronic conditions such as diabetes and  high blood pressure.  Dental: Annual dental screenings help detect early signs of oral cancer, gum disease, and other conditions linked to overall health, including heart disease and diabetes.  Please see the attached documents for additional preventive care recommendations.      [name] , Thank you for taking time to come for your Medicare Wellness Visit. I appreciate your ongoing commitment to your health goals. Please review the following plan we discussed and let me know if I can assist you in the future.   Screening recommendations/referrals: Colonoscopy:  Mammogram:  Bone Density:  Recommended yearly ophthalmology/optometry visit for glaucoma screening and checkup Recommended yearly dental visit for hygiene and checkup  Vaccinations: Influenza vaccine:  Pneumococcal vaccine:  Tdap vaccine:  Shingles vaccine:       Preventive Care 65 Years and Older, Female Preventive care refers to lifestyle choices and visits with your health care provider that can promote health and wellness. What does preventive care include? A yearly physical exam. This is also called an annual well check. Dental exams once or twice a year. Routine eye exams. Ask your health care provider how often you should have your eyes checked. Personal lifestyle choices, including: Daily care of your teeth and gums. Regular physical activity. Eating a healthy diet. Avoiding tobacco and drug use. Limiting alcohol use. Practicing safe sex. Taking low-dose aspirin  every day. Taking vitamin and mineral supplements as recommended by your health care provider. What happens during an annual well check? The services and  screenings done by your health care provider during your annual well check will depend on your age, overall health, lifestyle risk factors, and family history of disease. Counseling  Your health care provider may ask you questions about your: Alcohol use. Tobacco use. Drug use. Emotional  well-being. Home and relationship well-being. Sexual activity. Eating habits. History of falls. Memory and ability to understand (cognition). Work and work astronomer. Reproductive health. Screening  You may have the following tests or measurements: Height, weight, and BMI. Blood pressure. Lipid and cholesterol levels. These may be checked every 5 years, or more frequently if you are over 61 years old. Skin check. Lung cancer screening. You may have this screening every year starting at age 22 if you have a 30-pack-year history of smoking and currently smoke or have quit within the past 15 years. Fecal occult blood test (FOBT) of the stool. You may have this test every year starting at age 57. Flexible sigmoidoscopy or colonoscopy. You may have a sigmoidoscopy every 5 years or a colonoscopy every 10 years starting at age 76. Hepatitis C blood test. Hepatitis B blood test. Sexually transmitted disease (STD) testing. Diabetes screening. This is done by checking your blood sugar (glucose) after you have not eaten for a while (fasting). You may have this done every 1-3 years. Bone density scan. This is done to screen for osteoporosis. You may have this done starting at age 21. Mammogram. This may be done every 1-2 years. Talk to your health care provider about how often you should have regular mammograms. Talk with your health care provider about your test results, treatment options, and if necessary, the need for more tests. Vaccines  Your health care provider may recommend certain vaccines, such as: Influenza vaccine. This is recommended every year. Tetanus, diphtheria, and acellular pertussis (Tdap, Td) vaccine. You may need a Td booster every 10 years. Zoster vaccine. You may need this after age 87. Pneumococcal 13-valent conjugate (PCV13) vaccine. One dose is recommended after age 52. Pneumococcal polysaccharide (PPSV23) vaccine. One dose is recommended after age 59. Talk to your  health care provider about which screenings and vaccines you need and how often you need them. This information is not intended to replace advice given to you by your health care provider. Make sure you discuss any questions you have with your health care provider. Document Released: 02/03/2015 Document Revised: 09/27/2015 Document Reviewed: 11/08/2014 Elsevier Interactive Patient Education  2017 [employer].  Fall Prevention in the Home Falls can cause injuries. They can happen to people of all ages. There are many things you can do to make your home safe and to help prevent falls. What can I do on the outside of my home? Regularly fix the edges of walkways and driveways and fix any cracks. Remove anything that might make you trip as you walk through a door, such as a raised step or threshold. Trim any bushes or trees on the path to your home. Use bright outdoor lighting. Clear any walking paths of anything that might make someone trip, such as rocks or tools. Regularly check to see if handrails are loose or broken. Make sure that both sides of any steps have handrails. Any raised decks and porches should have guardrails on the edges. Have any leaves, snow, or ice cleared regularly. Use sand or salt on walking paths during winter. Clean up any spills in your garage right away. This includes oil or grease spills. What can I do in the bathroom? Use night  lights. Install grab bars by the toilet and in the tub and shower. Do not use towel bars as grab bars. Use non-skid mats or decals in the tub or shower. If you need to sit down in the shower, use a plastic, non-slip stool. Keep the floor dry. Clean up any water that spills on the floor as soon as it happens. Remove soap buildup in the tub or shower regularly. Attach bath mats securely with double-sided non-slip rug tape. Do not have throw rugs and other things on the floor that can make you trip. What can I do in the bedroom? Use night  lights. Make sure that you have a light by your bed that is easy to reach. Do not use any sheets or blankets that are too big for your bed. They should not hang down onto the floor. Have a firm chair that has side arms. You can use this for support while you get dressed. Do not have throw rugs and other things on the floor that can make you trip. What can I do in the kitchen? Clean up any spills right away. Avoid walking on wet floors. Keep items that you use a lot in easy-to-reach places. If you need to reach something above you, use a strong step stool that has a grab bar. Keep electrical cords out of the way. Do not use floor polish or wax that makes floors slippery. If you must use wax, use non-skid floor wax. Do not have throw rugs and other things on the floor that can make you trip. What can I do with my stairs? Do not leave any items on the stairs. Make sure that there are handrails on both sides of the stairs and use them. Fix handrails that are broken or loose. Make sure that handrails are as long as the stairways. Check any carpeting to make sure that it is firmly attached to the stairs. Fix any carpet that is loose or worn. Avoid having throw rugs at the top or bottom of the stairs. If you do have throw rugs, attach them to the floor with carpet tape. Make sure that you have a light switch at the top of the stairs and the bottom of the stairs. If you do not have them, ask someone to add them for you. What else can I do to help prevent falls? Wear shoes that: Do not have high heels. Have rubber bottoms. Are comfortable and fit you well. Are closed at the toe. Do not wear sandals. If you use a stepladder: Make sure that it is fully opened. Do not climb a closed stepladder. Make sure that both sides of the stepladder are locked into place. Ask someone to hold it for you, if possible. Clearly mark and make sure that you can see: Any grab bars or handrails. First and last  steps. Where the edge of each step is. Use tools that help you move around (mobility aids) if they are needed. These include: Canes. Walkers. Scooters. Crutches. Turn on the lights when you go into a dark area. Replace any light bulbs as soon as they burn out. Set up your furniture so you have a clear path. Avoid moving your furniture around. If any of your floors are uneven, fix them. If there are any pets around you, be aware of where they are. Review your medicines with your doctor. Some medicines can make you feel dizzy. This can increase your chance of falling. Ask your doctor what other things that  you can do to help prevent falls. This information is not intended to replace advice given to you by your health care provider. Make sure you discuss any questions you have with your health care provider. Document Released: 11/03/2008 Document Revised: 06/15/2015 Document Reviewed: 02/11/2014 Elsevier Interactive Patient Education  2017 [employer].

## 2023-12-09 NOTE — Progress Notes (Signed)
 Chief Complaint  Patient presents with   Medicare Wellness     Subjective:   Sabrina Knight is a 73 y.o. female who presents for a Medicare Annual Wellness Visit.  Allergies (verified) Lisinopril    History: Past Medical History:  Diagnosis Date   Allergy    Zyrtec, Singulair ; allergy testing negative; Cheryn.   Arthritis    Asthma due to seasonal allergies    Cataract    Clotting disorder    Glaucoma    Gout    ITP (idiopathic thrombocytopenic purpura)    Past Surgical History:  Procedure Laterality Date   ABDOMINAL HYSTERECTOMY  01/22/1999   ovaries resected; DUB/fibroids   CARPAL TUNNEL RELEASE Right 09/11/2018   Procedure: CARPAL TUNNEL RELEASE;  Surgeon: Murrell Kuba, MD;  Location: [facility];  Service: Orthopedics;  Laterality: Right;   CARPAL TUNNEL RELEASE Left 10/02/2018   Procedure: CARPAL TUNNEL RELEASE;  Surgeon: Murrell Kuba, MD;  Location: [facility];  Service: Orthopedics;  Laterality: Left;  FAB   EYE SURGERY N/A    Phreesia 06/26/2019   LEFT HEART CATH AND CORONARY ANGIOGRAPHY N/A 07/19/2021   Procedure: LEFT HEART CATH AND CORONARY ANGIOGRAPHY;  Surgeon: Verlin Lonni BIRCH, MD;  [facility]: [facility];  Service: Cardiovascular;  Laterality: N/A;   spleen removal  01/22/1995   for ITP   spleen removal     Family History  Problem Relation Age of Onset   Cancer Mother 26       lung cancer   Hypertension Mother    Hyperlipidemia Mother    Mental illness Mother    Hypertension Sister    Hypertension Brother    Diabetes Brother    Cancer Brother 51       bladder cancer; non-smoker   Heart disease Brother 65       CABG/CAD   Hypertension Sister    Diabetes Sister    Hypertension Brother    Diabetes Brother    Hypertension Brother    Diabetes Brother    Hypertension Brother    COPD Father    Social History   Occupational History   Occupation: Retired  Tobacco Use   Smoking status: Former    Current  packs/day: 0.00    Types: Cigarettes    Quit date: 12/04/2003    Years since quitting: 20.0   Smokeless tobacco: Never  Vaping Use   Vaping status: Never Used  Substance and Sexual Activity   Alcohol use: No    Alcohol/week: 0.0 standard drinks of alcohol   Drug use: No   Sexual activity: Not Currently    Birth control/protection: Surgical   Tobacco Counseling Counseling given: Not Answered  SDOH Screenings   Food Insecurity: Unknown (12/09/2023)  Housing: Unknown (12/09/2023)  Transportation Needs: No Transportation Needs (12/09/2023)  Utilities: Not At Risk (12/09/2023)  Alcohol Screen: Low Risk  (11/06/2022)  Depression (PHQ2-9): Low Risk  (12/09/2023)  Financial Resource Strain: Low Risk  (06/07/2023)  Physical Activity: Sufficiently Active (12/09/2023)  Social Connections: Moderately Integrated (12/09/2023)  Stress: No Stress Concern Present (12/09/2023)  Tobacco Use: Medium Risk (12/09/2023)  Health Literacy: Adequate Health Literacy (12/09/2023)   See flowsheets for full screening details  Depression Screen PHQ 2 & 9 Depression Scale- Over the past 2 weeks, how often have you been bothered by any of the following problems? Little interest or pleasure in doing things: 0 Feeling down, depressed, or hopeless (PHQ Adolescent also includes...irritable): 0 PHQ-2 Total Score: 0  Trouble falling or staying asleep, or sleeping too much: 0 Feeling tired or having little energy: 0 Poor appetite or overeating (PHQ Adolescent also includes...weight loss): 0 Feeling bad about yourself - or that you are a failure or have let yourself or your family down: 0 Trouble concentrating on things, such as reading the newspaper or watching television (PHQ Adolescent also includes...like school work): 0 Moving or speaking so slowly that other people could have noticed. Or the opposite - being so fidgety or restless that you have been moving around a lot more than usual: 0 Thoughts that you  would be better off dead, or of hurting yourself in some way: 0 PHQ-9 Total Score: 0 If you checked off any problems, how difficult have these problems made it for you to do your work, take care of things at home, or get along with other people?: Not difficult at all     Goals Addressed             This Visit's Progress    Patient Stated   On track    Continue current lifestyle     Patient Stated       Continue current lifestyle       Visit info / Clinical Intake: Medicare Wellness Visit Type:: Subsequent Annual Wellness Visit Persons participating in visit:: patient Medicare Wellness Visit Mode:: Video Because this visit was a virtual/telehealth visit:: vitals recorded from last visit If Telephone or Video please confirm:: I connected with the patient using audio enabled telemedicine application and verified that I am speaking with the correct person using two identifiers Patient Location:: home Provider Location:: home Information given by:: patient Interpreter Needed?: No AWV questionnaire completed by patient prior to visit?: no Living arrangements:: (!) lives alone Patient's Overall Health Status Rating: good Typical amount of pain: none Are you currently prescribed opioids?: no  Dietary Habits and Nutritional Risks How many meals a day?: 2 Eats fruit and vegetables daily?: yes Most meals are obtained by: preparing own meals In the last 2 weeks, have you had any of the following?: none Diabetic:: (!) yes Any non-healing wounds?: (!) yes How often do you check your BS?: 1 Would you like to be referred to a Nutritionist or for Diabetic Management? : no  Functional Status Activities of Daily Living (to include ambulation/medication): Independent Ambulation: Independent Medication Administration: Independent Home Management: Independent Manage your own finances?: (!) no Primary transportation is: driving Concerns about vision?: no *vision screening is required  for WTM* Concerns about hearing?: no  Fall Screening Falls in the past year?: 0 Number of falls in past year: 0 Was there an injury with Fall?: 0 Fall Risk Category Calculator: 0 Patient Fall Risk Level: Low Fall Risk  Fall Risk Patient at Risk for Falls Due to: No Fall Risks Fall risk Follow up: Falls evaluation completed; Education provided; Falls prevention discussed  Home and Transportation Safety: All rugs have non-skid backing?: yes All stairs or steps have railings?: N/A, no stairs Grab bars in the bathtub or shower?: yes Have non-skid surface in bathtub or shower?: yes Good home lighting?: yes Regular seat belt use?: yes Hospital stays in the last year:: no  Cognitive Assessment Difficulty concentrating, remembering, or making decisions? : no Will 6CIT or Mini Cog be Completed: yes What year is it?: 0 points What month is it?: 0 points Give patient an address phrase to remember (5 components): Its very sunny outside today in November About what time is it?: 0 points Count  backwards from 20 to 1: 0 points Say the months of the year in reverse: 0 points Repeat the address phrase from earlier: 0 points 6 CIT Score: 0 points  Advance Directives (For Healthcare) Does Patient Have a Medical Advance Directive?: No Would patient like information on creating a medical advance directive?: No - Patient declined  Reviewed/Updated  Reviewed/Updated: Reviewed All (Medical, Surgical, Family, Medications, Allergies, Care Teams, Patient Goals); Medical History; Surgical History; Family History; Medications; Allergies; Care Teams; Patient Goals        Objective:    Today's Vitals   12/09/23 1312  Weight: 194 lb (88 kg)  Height: 5' 5 (1.651 m)   Body mass index is 32.28 kg/m.  Current Medications (verified) Outpatient Encounter Medications as of 12/09/2023  Medication Sig   allopurinol  (ZYLOPRIM ) 100 MG tablet TAKE 1 TABLET BY MOUTH TWICE A DAY   aspirin  EC 81 MG  tablet Take 81 mg by mouth daily. Swallow whole.   blood glucose meter kit and supplies Dispense based on patient and insurance preference. Use once per day.   Blood Glucose Monitoring Suppl (ONE TOUCH ULTRA 2) w/Device KIT CHECK EITHER FASTING OR 2 HOURS AFTER MEALS.   budesonide (PULMICORT) 0.5 MG/2ML nebulizer solution Take by nebulization as needed (shortness of breath).   cetirizine (ZYRTEC) 10 MG tablet Take 10 mg by mouth daily.   co-enzyme Q-10 30 MG capsule Take 30 mg by mouth daily.   empagliflozin  (JARDIANCE ) 25 MG TABS tablet Take 1 tablet (25 mg total) by mouth daily before breakfast.   Glycerin-Polysorbate 80 (REFRESH DRY EYE THERAPY OP) Place 1 drop into both eyes daily as needed (Dry eyes).   Krill Oil 300 MG CAPS Take 300 mg by mouth daily.   Lancets (ONETOUCH DELICA PLUS LANCET33G) MISC USE AS DIRECTED   magnesium gluconate (MAGONATE) 500 MG tablet Take 500 mg by mouth at bedtime.   metoprolol  succinate (TOPROL -XL) 25 MG 24 hr tablet TAKE 1 TABLET (25 MG TOTAL) BY MOUTH DAILY.   montelukast  (SINGULAIR ) 10 MG tablet Take 1 tablet (10 mg total) by mouth daily.   Multiple Vitamin (MULTIVITAMIN WITH MINERALS) TABS tablet Take 1 tablet by mouth daily. Woman   ONETOUCH ULTRA test strip USE 1 STRIP DAILY AS DIRECTED   rosuvastatin  (CRESTOR ) 10 MG tablet TAKE 1 TABLET BY MOUTH DAILY IN THE EVENING 5 DAYS PER WEEK   sacubitril-valsartan (ENTRESTO ) 24-26 MG TAKE 1 TABLET BY MOUTH TWICE A DAY   Tart Cherry 1200 MG CAPS Take 1,200 mg by mouth at bedtime.   VITAMIN D, CHOLECALCIFEROL, PO Take 500 mg by mouth daily.   vitamin E 180 MG (400 UNITS) capsule Take 400 Units by mouth daily.   WIXELA INHUB 100-50 MCG/ACT AEPB INHALE 1 PUFF INTO THE LUNGS TWICE A DAY   No facility-administered encounter medications on file as of 12/09/2023.   Hearing/Vision screen Hearing Screening - Comments:: No trouble hearing Vision Screening - Comments:: [place] Up to date Immunizations and Health  Maintenance Health Maintenance  Topic Date Due   Zoster Vaccines- Shingrix (1 of 2) 06/24/1968   Influenza Vaccine  08/22/2023   Diabetic kidney evaluation - Urine ACR  03/05/2024   FOOT EXAM  03/05/2024   HEMOGLOBIN A1C  03/20/2024   OPHTHALMOLOGY EXAM  08/21/2024   Diabetic kidney evaluation - eGFR measurement  09/17/2024   Mammogram  12/07/2024   Medicare Annual Wellness (AWV)  12/08/2024   Colonoscopy  02/27/2027   DTaP/Tdap/Td (3 - Td or Tdap) 06/10/2030  Pneumococcal Vaccine: 50+ Years  Completed   DEXA SCAN  Completed   Hepatitis C Screening  Completed   Meningococcal B Vaccine  Aged Out   COVID-19 Vaccine  Discontinued        Assessment/Plan:  This is a routine wellness examination for Lucilia.  Patient Care Team: Levora Reyes SAUNDERS, MD as PCP - General (Family Medicine) Jeffrie Oneil BROCKS, MD as PCP - Cardiology (Cardiology) Cheryn Nickels, MD as Referring Physician (Allergy and Immunology) [place] Collar, MD as Consulting Physician (Ophthalmology)  I have personally reviewed and noted the following in the patient's chart:   Medical and social history Use of alcohol, tobacco or illicit drugs  Current medications and supplements including opioid prescriptions. Functional ability and status Nutritional status Physical activity Advanced directives List of other physicians Hospitalizations, surgeries, and ER visits in previous 12 months Vitals Screenings to include cognitive, depression, and falls Referrals and appointments  No orders of the defined types were placed in this encounter.  In addition, I have reviewed and discussed with patient certain preventive protocols, quality metrics, and best practice recommendations. A written personalized care plan for preventive services as well as general preventive health recommendations were provided to patient.   Mliss Graff, LPN   [id number]/81/[id number]   Return in 1 year (on 12/08/2024).  After Visit Summary: (MyChart) Due to this  being a telephonic visit, the after visit summary with patients personalized plan was offered to patient via MyChart   Nurse Notes:

## 2023-12-17 ENCOUNTER — Ambulatory Visit: Admitting: Family Medicine

## 2023-12-22 ENCOUNTER — Ambulatory Visit: Admitting: Family Medicine

## 2023-12-22 ENCOUNTER — Encounter: Payer: Self-pay | Admitting: Family Medicine

## 2023-12-22 VITALS — BP 110/68 | HR 77 | Temp 97.8°F | Resp 15 | Ht 65.0 in | Wt 191.8 lb

## 2023-12-22 DIAGNOSIS — R809 Proteinuria, unspecified: Secondary | ICD-10-CM | POA: Diagnosis not present

## 2023-12-22 DIAGNOSIS — Z23 Encounter for immunization: Secondary | ICD-10-CM

## 2023-12-22 DIAGNOSIS — E1129 Type 2 diabetes mellitus with other diabetic kidney complication: Secondary | ICD-10-CM | POA: Diagnosis not present

## 2023-12-22 DIAGNOSIS — I5020 Unspecified systolic (congestive) heart failure: Secondary | ICD-10-CM

## 2023-12-22 DIAGNOSIS — L570 Actinic keratosis: Secondary | ICD-10-CM | POA: Insufficient documentation

## 2023-12-22 DIAGNOSIS — Z7984 Long term (current) use of oral hypoglycemic drugs: Secondary | ICD-10-CM | POA: Diagnosis not present

## 2023-12-22 LAB — GLUCOSE, POCT (MANUAL RESULT ENTRY): POC Glucose: 88 mg/dL (ref 70–99)

## 2023-12-22 LAB — POCT GLYCOSYLATED HEMOGLOBIN (HGB A1C): Hemoglobin A1C: 6.8 % — AB (ref 4.0–5.6)

## 2023-12-22 NOTE — Progress Notes (Signed)
 Subjective:  Patient ID: Sabrina Knight, female    DOB: 1950/04/26  Age: 73 y.o. MRN: [id number]  CC:  Chief Complaint  Patient presents with   Diabetes    When she checks sugar, levels re 180s    HPI Sabrina Knight presents for   Diabetes: With microalbuminuria, chronic heart failure with reduced EF.  Treated with Jardiance , Entresto , on [place].  She is on statin and ARB.  Denies mycotic or UTI symptoms.    A1c was near goal in August at 7.7 with reasonable goal at her age of 7.5 or lower.  Jardiance  now at 25 mg daily - Denies any side effects with current medications or this increase.  Home blood sugars have been around 110-120, up to 180. No 200's, no sx lows.  Exercising 3 times per week.  Microalbumin: Normal 03/06/2023. Optho, foot exam, pneumovax: Up-to-date Lab Results  Component Value Date   HGBA1C 6.8 (A) 12/22/2023   HGBA1C 7.7 (H) 09/18/2023   HGBA1C 7.6 (H) 06/11/2023   Lab Results  Component Value Date   MICROALBUR <0.7 03/06/2023   LDLCALC 76 09/18/2023   CREATININE 0.92 09/18/2023    History Patient Active Problem List   Diagnosis Date Noted   Actinic keratosis 12/22/2023   Shortness of breath    Precordial chest pain 07/18/2021   PVC's (premature ventricular contractions) 07/18/2021   Degenerative disc disease, lumbar 07/12/2020   Degenerative arthritis of left knee 05/26/2020   Greater trochanteric bursitis of both hips 05/26/2020   Mild persistent asthma, uncomplicated 01/08/2019   Vasomotor rhinitis 01/08/2019   Other allergic rhinitis 01/08/2019   Bilateral carpal tunnel syndrome 09/25/2018   Dyslipidemia 10/22/2017   Statin intolerance 10/22/2017   Newly diagnosed diabetes (HCC) 10/22/2017   Gout 12/23/2016   Class 1 obesity due to excess calories with serious comorbidity and body mass index (BMI) of 31.0 to 31.9 in adult 05/19/2016   Osteopenia of necks of both femurs 05/19/2016   Thrombocytosis 02/08/2014   High cholesterol 02/01/2013    H/O splenectomy 02/01/2013   Non-allergic rhinitis 02/01/2013   Glaucoma 02/01/2013   Past Medical History:  Diagnosis Date   Allergy    Zyrtec, Singulair ; allergy testing negative; Cheryn.   Arthritis    Asthma due to seasonal allergies    Cataract    Clotting disorder    Glaucoma    Gout    ITP (idiopathic thrombocytopenic purpura)    Past Surgical History:  Procedure Laterality Date   ABDOMINAL HYSTERECTOMY  01/22/1999   ovaries resected; DUB/fibroids   CARPAL TUNNEL RELEASE Right 09/11/2018   Procedure: CARPAL TUNNEL RELEASE;  Surgeon: Murrell Kuba, MD;  Location: [facility];  Service: Orthopedics;  Laterality: Right;   CARPAL TUNNEL RELEASE Left 10/02/2018   Procedure: CARPAL TUNNEL RELEASE;  Surgeon: Murrell Kuba, MD;  Location: [facility];  Service: Orthopedics;  Laterality: Left;  FAB   EYE SURGERY N/A    Phreesia 06/26/2019   LEFT HEART CATH AND CORONARY ANGIOGRAPHY N/A 07/19/2021   Procedure: LEFT HEART CATH AND CORONARY ANGIOGRAPHY;  Surgeon: Verlin Lonni BIRCH, MD;  Location: [facility];  Service: Cardiovascular;  Laterality: N/A;   spleen removal  01/22/1995   for ITP   spleen removal     Allergies  Allergen Reactions   Lisinopril  Cough   Prior to Admission medications   Medication Sig Start Date End Date Taking? Authorizing Provider  allopurinol  (ZYLOPRIM ) 100 MG tablet TAKE 1 TABLET BY  MOUTH TWICE A DAY 07/29/23  Yes Levora Reyes SAUNDERS, MD  aspirin  EC 81 MG tablet Take 81 mg by mouth daily. Swallow whole.   Yes [provider]  blood glucose meter kit and supplies Dispense based on patient and insurance preference. Use once per day. 10/26/21  Yes Levora Reyes SAUNDERS, MD  Blood Glucose Monitoring Suppl (ONE TOUCH ULTRA 2) w/Device KIT CHECK EITHER FASTING OR 2 HOURS AFTER MEALS. 04/05/22  Yes Levora Reyes SAUNDERS, MD  budesonide (PULMICORT) 0.5 MG/2ML nebulizer solution Take by nebulization as needed (shortness of breath).  04/09/21  Yes [provider]  cetirizine (ZYRTEC) 10 MG tablet Take 10 mg by mouth daily.   Yes [provider]  co-enzyme Q-10 30 MG capsule Take 30 mg by mouth daily.   Yes [provider]  empagliflozin  (JARDIANCE ) 25 MG TABS tablet Take 1 tablet (25 mg total) by mouth daily before breakfast. 09/22/23  Yes Levora Reyes SAUNDERS, MD  Glycerin-Polysorbate 80 (REFRESH DRY EYE THERAPY OP) Place 1 drop into both eyes daily as needed (Dry eyes).   Yes [provider]  Anselm Oil 300 MG CAPS Take 300 mg by mouth daily.   Yes [provider]  Lancets [facility] DELICA PLUS LANCET33G) MISC USE AS DIRECTED 06/13/23  Yes Levora Reyes SAUNDERS, MD  magnesium gluconate (MAGONATE) 500 MG tablet Take 500 mg by mouth at bedtime.   Yes [provider]  metoprolol  succinate (TOPROL -XL) 25 MG 24 hr tablet TAKE 1 TABLET (25 MG TOTAL) BY MOUTH DAILY. 05/15/23  Yes Jeffrie Oneil BROCKS, MD  montelukast  (SINGULAIR ) 10 MG tablet Take 1 tablet (10 mg total) by mouth daily. 02/07/21  Yes Levora Reyes SAUNDERS, MD  Multiple Vitamin (MULTIVITAMIN WITH MINERALS) TABS tablet Take 1 tablet by mouth daily. Woman   Yes [provider]  [facility] ULTRA test strip USE 1 STRIP DAILY AS DIRECTED 11/13/22  Yes Levora Reyes SAUNDERS, MD  rosuvastatin  (CRESTOR ) 10 MG tablet TAKE 1 TABLET BY MOUTH DAILY IN THE EVENING 5 DAYS PER WEEK 07/10/23  Yes Levora Reyes SAUNDERS, MD  sacubitril-valsartan (ENTRESTO ) 24-26 MG TAKE 1 TABLET BY MOUTH TWICE A DAY 08/07/23  Yes Jeffrie Oneil BROCKS, MD  Tart Cherry 1200 MG CAPS Take 1,200 mg by mouth at bedtime.   Yes [provider]  VITAMIN D, CHOLECALCIFEROL, PO Take 500 mg by mouth daily.   Yes [provider]  vitamin E 180 MG (400 UNITS) capsule Take 400 Units by mouth daily.   Yes [provider]  NAPOLEON INHUB 100-50 MCG/ACT AEPB INHALE 1 PUFF INTO THE LUNGS TWICE A DAY 10/24/23  Yes Levora Reyes SAUNDERS, MD   Social History   Socioeconomic History    Marital status: Widowed    Spouse name: Not on file   Number of children: 0   Years of education: Not on file   Highest education level: Associate degree: occupational, scientist, product/process development, or vocational program  Occupational History   Occupation: Retired  Tobacco Use   Smoking status: Former    Current packs/day: 0.00    Types: Cigarettes    Quit date: 12/04/2003    Years since quitting: 20.0   Smokeless tobacco: Never  Vaping Use   Vaping status: Never Used  Substance and Sexual Activity   Alcohol use: No    Alcohol/week: 0.0 standard drinks of alcohol   Drug use: No   Sexual activity: Not Currently    Birth control/protection: Surgical  Other Topics Concern   Not on  file  Social History Narrative   Marital status:  Widowed x since 2001 from melanoma; married x 27 years.  Not dating really in 2019.      Children:  None; failed in vitro.      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      Tobacco: quit ; smoked 20 years.      Alcohol: none      Education: [employer].       Exercise: Yes; 3 times per week 2 hours deep water aerobics.      ADLs: independent ADLs; no assistant devices.      Advanced Directives:  None; FULL CODE; no prolonged measures.    HCPOA: Darlene Jackson/sister-in-law.     Social Drivers of Corporate Investment Banker Strain: Low Risk  (12/21/2023)   Overall Financial Resource Strain (CARDIA)    Difficulty of Paying Living Expenses: Not hard at all  Food Insecurity: No Food Insecurity (12/21/2023)   Hunger Vital Sign    Worried About Running Out of Food in the Last Year: Never true    Ran Out of Food in the Last Year: Never true  Transportation Needs: No Transportation Needs (12/21/2023)   PRAPARE - Administrator, Civil Service (Medical): No    Lack of Transportation (Non-Medical): No  Physical Activity: Sufficiently Active (12/21/2023)   Exercise Vital Sign    Days of Exercise per Week: 3 days     Minutes of Exercise per Session: 60 min  Stress: No Stress Concern Present (12/21/2023)   [employer] of Occupational Health - Occupational Stress Questionnaire    Feeling of Stress: Not at all  Social Connections: Moderately Isolated (12/21/2023)   Social Connection and Isolation Panel    Frequency of Communication with Friends and Family: More than three times a week    Frequency of Social Gatherings with Friends and Family: Three times a week    Attends Religious Services: More than 4 times per year    Active Member of Clubs or Organizations: No    Attends Banker Meetings: Not on file    Marital Status: Widowed  Intimate Partner Violence: Not At Risk (12/09/2023)   Humiliation, Afraid, Rape, and Kick questionnaire    Fear of Current or Ex-Partner: No    Emotionally Abused: No    Physically Abused: No    Sexually Abused: No    Review of Systems  Constitutional:  Negative for fatigue and unexpected weight change.  Respiratory:  Negative for chest tightness and shortness of breath.   Cardiovascular:  Negative for chest pain, palpitations and leg swelling.  Gastrointestinal:  Negative for abdominal pain and blood in stool.  Neurological:  Negative for dizziness, syncope, light-headedness and headaches.     Objective:   Vitals:   12/22/23 1551  BP: 110/68  Pulse: 77  Resp: 15  Temp: 97.8 F (36.6 C)  TempSrc: Temporal  SpO2: 95%  Weight: 191 lb 12.8 oz (87 kg)  Height: 5' 5 (1.651 m)     Physical Exam Vitals reviewed.  Constitutional:      Appearance: Normal appearance. She is well-developed.  HENT:     Head: Normocephalic and atraumatic.  Eyes:     Conjunctiva/sclera: Conjunctivae normal.     Pupils: Pupils are equal, round, and reactive to light.  Neck:     Vascular: No carotid bruit.  Cardiovascular:     Rate and Rhythm:  Normal rate and regular rhythm.     Heart sounds: Normal heart sounds.  Pulmonary:     Effort: Pulmonary effort is  normal.     Breath sounds: Normal breath sounds.  Abdominal:     Palpations: Abdomen is soft. There is no pulsatile mass.     Tenderness: There is no abdominal tenderness.  Musculoskeletal:     Right lower leg: No edema.     Left lower leg: No edema.  Skin:    General: Skin is warm and dry.  Neurological:     Mental Status: She is alert and oriented to person, place, and time.  Psychiatric:        Mood and Affect: Mood normal.        Behavior: Behavior normal.      Results for orders placed or performed in visit on 12/22/23  POCT glucose (manual entry)   Collection Time: 12/22/23  4:33 PM  Result Value Ref Range   POC Glucose 88 70 - 99 mg/dl  POCT glycosylated hemoglobin (Hb A1C)   Collection Time: 12/22/23  4:36 PM  Result Value Ref Range   Hemoglobin A1C 6.8 (A) 4.0 - 5.6 %   HbA1c POC (<> result, manual entry)     HbA1c, POC (prediabetic range)     HbA1c, POC (controlled diabetic range)       Assessment & Plan:  Sabrina Knight is a 73 y.o. female . Type 2 diabetes mellitus with microalbuminuria, without long-term current use of insulin (HCC) - Plan: POCT glucose (manual entry), POCT glycosylated hemoglobin (Hb A1C)  Need for influenza vaccination - Plan: Flu vaccine HIGH DOSE PF(Fluzone Trivalent)  HFrEF (heart failure with reduced ejection fraction) (HCC)  Appears euvolemic.  Commended on improved diabetic control, exercise as above.  Continue healthy food choices, staying active and tolerating higher dose of Jardiance .  Continue same regimen and recheck in 3 months.  At that time lipids and other labs.  If stable at that time 49-month follow-ups would be reasonable.  No orders of the defined types were placed in this encounter.  Patient Instructions  Keep up the good work with staying active and exercising.  As we discussed a reasonable A1c goal would be below 7.5 but closer to 7 is even better.  Yours is much better today at 6.8!  No med changes at this time.   Recheck in 3 months but let me know if you have any questions during that time.  Take care!    Signed,   Reyes Pines, MD  Primary Care, [facility] 12/22/23 4:40 PM

## 2023-12-22 NOTE — Patient Instructions (Addendum)
 Keep up the good work with staying active and exercising.  As we discussed a reasonable A1c goal would be below 7.5 but closer to 7 is even better.  Yours is much better today at 6.8!  No med changes at this time.  Recheck in 3 months but let me know if you have any questions during that time.  Take care!

## 2024-01-11 ENCOUNTER — Other Ambulatory Visit: Payer: Self-pay | Admitting: Family Medicine

## 2024-01-11 DIAGNOSIS — M109 Gout, unspecified: Secondary | ICD-10-CM

## 2024-02-03 ENCOUNTER — Other Ambulatory Visit: Payer: Self-pay | Admitting: Family Medicine

## 2024-02-03 DIAGNOSIS — E1129 Type 2 diabetes mellitus with other diabetic kidney complication: Secondary | ICD-10-CM

## 2024-02-19 ENCOUNTER — Other Ambulatory Visit: Payer: Self-pay | Admitting: Pharmacist

## 2024-02-19 ENCOUNTER — Other Ambulatory Visit: Admitting: Pharmacist

## 2024-02-19 DIAGNOSIS — E119 Type 2 diabetes mellitus without complications: Secondary | ICD-10-CM

## 2024-02-19 NOTE — Progress Notes (Signed)
 "  02/19/2024 Name: Sabrina Knight MRN: [id number] DOB: [birth year]  No chief complaint on file.   Sabrina Knight is a 74 y.o. year old female who presented for a telephone visit.   They were referred to the pharmacist by their PCP for assistance in managing diabetes, medication access, and complex medication management.    Subjective:  Medication Access/Adherence  Current Pharmacy:  [facility] [phone] [address], [place] - [address] [place] [address] Phone: [phone] Fax: [phone]   Patient reports affordability concerns with their medications: Yes  - Cost for Jardiance  and Entresto  was $400 for 90 days supply but she received [employer] in 2025 and it covered her cost. Patient reports access/transportation concerns to their pharmacy: No  Patient reports adherence concerns with their medications:  No      Diabetes:  Current medications:  Jardiance  25mg  daily  Medications tried in the past: none  Current glucose readings: no readings reported today Using One Touch  meter; testing once daily  Current medication access support: [employer] - due to renew   Heart Failure (EF 40-45% - 11/2021 ECHO):  Current medications:  ACEi/ARB/ARNI: yes - Entresto  24/26mg  twice a day SGLT2i: Yes - Jardiance  25mg  daily  Beta blocker: metoprolol  ER 25mg  daily  Mineralocorticoid Receptor Antagonist: no Diuretic regimen: no   Current medication access support: [employer] due to renew   Objective:  Lab Results  Component Value Date   HGBA1C 6.8 (A) 12/22/2023    Lab Results  Component Value Date   CREATININE 0.92 09/18/2023   BUN 11 09/18/2023   NA 141 09/18/2023   K 3.8 09/18/2023   CL 105 09/18/2023   CO2 27 09/18/2023    Lab Results  Component Value Date   CHOL 155 09/18/2023   HDL 42.00 09/18/2023   LDLCALC 76 09/18/2023   LDLDIRECT 91.0 08/16/2021   TRIG 187.0 (H) 09/18/2023   CHOLHDL 4 09/18/2023     Medications Reviewed Today     Reviewed by Carla Milling, RPH-CPP (Pharmacist) on 02/19/24 at 1600  Med List Status: <None>   Medication Order Taking? Sig Documenting Provider Last Dose Status Informant  allopurinol  (ZYLOPRIM ) 100 MG tablet [id number] Yes TAKE 1 TABLET BY MOUTH TWICE A DAY Levora Reyes SAUNDERS, MD  Active   aspirin  EC 81 MG tablet [id number] Yes Take 81 mg by mouth daily. Swallow whole. [provider]  Active Self  blood glucose meter kit and supplies [id number]  Dispense based on patient and insurance preference. Use once per day. Levora Reyes SAUNDERS, MD  Active   Blood Glucose Monitoring Suppl (ONE TOUCH ULTRA 2) w/Device KIT [id number]  CHECK EITHER FASTING OR 2 HOURS AFTER MEALS. Levora Reyes SAUNDERS, MD  Active   budesonide (PULMICORT) 0.5 MG/2ML nebulizer solution [id number] Yes Take by nebulization as needed (shortness of breath). [provider]  Active   cetirizine (ZYRTEC) 10 MG tablet [id number] Yes Take 10 mg by mouth daily. [provider]  Active Self  co-enzyme Q-10 30 MG capsule [id number] Yes Take 30 mg by mouth daily. [provider]  Active Self  empagliflozin  (JARDIANCE ) 25 MG TABS tablet [id number] Yes Take 1 tablet (25 mg total) by mouth daily before breakfast. Levora Reyes SAUNDERS, MD  Active   Glycerin-Polysorbate 80 [facility] DRY EYE THERAPY OP) [id number]  Place 1 drop into both eyes daily as needed (Dry eyes). [provider]  Active Self  Krill Oil 300 MG CAPS [id number]  Take 300 mg by mouth daily. [provider]  Active Self  Lancets JANETT [place] PLUS [place]) MISC [id number]  USE AS DIRECTED Levora Reyes SAUNDERS, MD  Active   magnesium gluconate (MAGONATE) 500 MG tablet [id number] Yes Take 500 mg by mouth at bedtime. [provider]  Active Self  metoprolol  succinate (TOPROL -XL) 25 MG 24 hr tablet [id number] Yes TAKE 1 TABLET (25 MG TOTAL) BY MOUTH DAILY. Jeffrie Oneil BROCKS, MD  Active   montelukast   (SINGULAIR ) 10 MG tablet [id number]  Take 1 tablet (10 mg total) by mouth daily. Levora Reyes SAUNDERS, MD  Active Self  Multiple Vitamin (MULTIVITAMIN WITH MINERALS) TABS tablet [id number] Yes Take 1 tablet by mouth daily. Woman [provider]  Active Self  AISHA FLING test strip [id number]  USE 1 STRIP DAILY AS DIRECTED Levora Reyes SAUNDERS, MD  Active   rosuvastatin  (CRESTOR ) 10 MG tablet [id number] Yes TAKE 1 TABLET BY MOUTH DAILY IN THE EVENING 5 DAYS PER WEEK Levora Reyes SAUNDERS, MD  Active   sacubitril-valsartan (ENTRESTO ) 24-26 MG [id number] Yes TAKE 1 TABLET BY MOUTH TWICE A DAY Jeffrie Oneil BROCKS, MD  Active   Tart Cherry 1200 MG CAPS [id number]  Take 1,200 mg by mouth at bedtime. [provider]  Active Self  VITAMIN D, CHOLECALCIFEROL, PO [id number] Yes Take 500 mg by mouth daily. [provider]  Active Self  vitamin E 180 MG (400 UNITS) capsule [id number] Yes Take 400 Units by mouth daily. [provider]  Active Self  NAPOLEON INHUB 100-50 MCG/ACT AEPB [id number] Yes INHALE 1 PUFF INTO THE LUNGS TWICE A DAY Levora Reyes SAUNDERS, MD  Active             [facility] approved:  [employer] ID: [id number] Patient: Sabrina Knight Status: Approved Start Date: 03/15/2024 End Date: 03/14/2025 [place]rene Balance: $7500 Pharmacy Card Card No.: [id number] BIN: [id number] PCN: [id number] PC Group: [id number] Help Desk: [phone]    Assessment/Plan:   Diabetes:Last A1c was 7.3%  - Recommend to continue Jardiance  25mg  daily - Recommend to check glucose once daily   Heart Failure:Currently appropriately managed - Recommend to continue current therapy and follow up with Dr Jeffrie as recommended.   - Meets financial criteria for [employer] - re-enrolled thru 03/14/2025   Medication Access:  -Applied for [employer]. Patient was approved for $7500 with [employer]. These grant will cover Jardiance  and Entresto  cost.    Follow Up Plan: 1 year to  reapply  for [employer].   Madelin Ray, PharmD Clinical Pharmacist [facility]  Population Health [phone]   "

## 2024-03-22 ENCOUNTER — Ambulatory Visit: Admitting: Family Medicine
